# Patient Record
Sex: Female | Born: 1995 | Race: Black or African American | Hispanic: No | Marital: Single | Smoking: Current every day smoker
Health system: Southern US, Community
[De-identification: ages and names within clinical notes are randomized; demographics above are authoritative.]

## PROBLEM LIST (undated history)

## (undated) ENCOUNTER — Inpatient Hospital Stay (HOSPITAL_COMMUNITY): Payer: Self-pay

## (undated) ENCOUNTER — Emergency Department (HOSPITAL_BASED_OUTPATIENT_CLINIC_OR_DEPARTMENT_OTHER)

## (undated) DIAGNOSIS — F329 Major depressive disorder, single episode, unspecified: Secondary | ICD-10-CM

## (undated) DIAGNOSIS — F419 Anxiety disorder, unspecified: Secondary | ICD-10-CM

## (undated) DIAGNOSIS — R51 Headache: Secondary | ICD-10-CM

## (undated) DIAGNOSIS — F32A Depression, unspecified: Secondary | ICD-10-CM

## (undated) DIAGNOSIS — R519 Headache, unspecified: Secondary | ICD-10-CM

## (undated) DIAGNOSIS — IMO0002 Reserved for concepts with insufficient information to code with codable children: Secondary | ICD-10-CM

## (undated) DIAGNOSIS — D649 Anemia, unspecified: Secondary | ICD-10-CM

## (undated) HISTORY — PX: APPENDECTOMY: SHX54

## (undated) HISTORY — PX: HERNIA REPAIR: SHX51

---
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
## 2016-02-11 ENCOUNTER — Inpatient Hospital Stay (HOSPITAL_COMMUNITY)
Admission: AD | Admit: 2016-02-11 | Discharge: 2016-02-11 | Disposition: A | Discharge status: Home / Self Care | Payer: Commercial Managed Care - HMO | Source: Ambulatory Visit | Attending: Obstetrics and Gynecology | Admitting: Obstetrics and Gynecology

## 2016-02-11 ENCOUNTER — Inpatient Hospital Stay (HOSPITAL_COMMUNITY): Payer: Commercial Managed Care - HMO

## 2016-02-11 ENCOUNTER — Encounter (HOSPITAL_COMMUNITY): Payer: Self-pay | Admitting: *Deleted

## 2016-02-11 DIAGNOSIS — O4691 Antepartum hemorrhage, unspecified, first trimester: Secondary | ICD-10-CM

## 2016-02-11 DIAGNOSIS — O468X1 Other antepartum hemorrhage, first trimester: Secondary | ICD-10-CM

## 2016-02-11 DIAGNOSIS — O418X10 Other specified disorders of amniotic fluid and membranes, first trimester, not applicable or unspecified: Secondary | ICD-10-CM

## 2016-02-11 DIAGNOSIS — O209 Hemorrhage in early pregnancy, unspecified: Secondary | ICD-10-CM | POA: Insufficient documentation

## 2016-02-11 DIAGNOSIS — Z3A01 Less than 8 weeks gestation of pregnancy: Secondary | ICD-10-CM | POA: Insufficient documentation

## 2016-02-11 DIAGNOSIS — N898 Other specified noninflammatory disorders of vagina: Secondary | ICD-10-CM | POA: Diagnosis present

## 2016-02-11 LAB — URINALYSIS, ROUTINE W REFLEX MICROSCOPIC
BILIRUBIN URINE: NEGATIVE
GLUCOSE, UA: NEGATIVE mg/dL
KETONES UR: 15 mg/dL — AB
LEUKOCYTES UA: NEGATIVE
Nitrite: NEGATIVE
PH: 5.5 (ref 5.0–8.0)
PROTEIN: NEGATIVE mg/dL
Specific Gravity, Urine: 1.025 (ref 1.005–1.030)

## 2016-02-11 LAB — POCT PREGNANCY, URINE: Preg Test, Ur: POSITIVE — AB

## 2016-02-11 LAB — URINE MICROSCOPIC-ADD ON

## 2016-02-11 LAB — WET PREP, GENITAL
CLUE CELLS WET PREP: NONE SEEN
SPERM: NONE SEEN
TRICH WET PREP: NONE SEEN
YEAST WET PREP: NONE SEEN

## 2016-02-11 MED ORDER — ACETAMINOPHEN 325 MG PO TABS
650.0000 mg | ORAL_TABLET | Freq: Once | ORAL | Status: AC
  Administered 2016-02-11: 650 mg via ORAL
  Filled 2016-02-11: qty 2

## 2016-02-11 NOTE — MAU Provider Note (Signed)
History     CSN: [id number]  Arrival date and time: 02/11/16 1518   First Provider Initiated Contact with Patient 02/11/16 1619      Chief Complaint  Patient presents with  . Vaginal Discharge   HPI Kari [name] 20 y.o. [redacted]w[redacted]d  Has been seen in the office with this pregnancy and has had an ultrasound documenting the baby's heart beat.  Today she has had a headache and has noticed red blood in her underwear.  Was anxious about what is causing the bleeding so she called the office and came in to MAU.  She is a G2P0 and had a 30 week fetal demise in October 2016.    OB History    Gravida Para Term Preterm AB TAB SAB Ectopic Multiple Living   0 0      Past Medical History  Diagnosis Date  . Medical history non-contributory     Past Surgical History  Procedure Laterality Date  . Appendectomy    . Hernia repair      No family history on file.  Social History  Substance Use Topics  . Smoking status: Never Smoker   . Smokeless tobacco: Never Used  . Alcohol Use: No    Allergies: No Known Allergies  Prescriptions prior to admission  Medication Sig Dispense Refill Last Dose  . Prenatal Vit-Fe Fumarate-FA (PRENATAL MULTIVITAMIN) TABS tablet Take 1 tablet by mouth daily at 12 noon.   02/10/2016 at Unknown time    Review of Systems  Constitutional: Negative for fever.  Gastrointestinal: Negative for nausea, vomiting and abdominal pain.  Genitourinary:       No vaginal discharge. Vaginal bleeding. No dysuria.   Physical Exam   Blood pressure 103/58, pulse 82, temperature 99.2 F (37.3 C), temperature source Oral, resp. rate 16, height  (1.575 m), weight 106 lb 9.6 oz (48.353 kg), last menstrual period 11/20/2015.  Physical Exam  Nursing note and vitals reviewed. Constitutional: She is oriented to person, place, and time. She appears well-developed and well-nourished.  HENT:  Head: Normocephalic.  Eyes: EOM are normal.  Neck: Neck supple.   GI: Soft. There is no tenderness.  Genitourinary:  Speculum exam: Vagina - Small amount of bright red streaks in clear discharge, no odor Cervix - No active bleeding, no contact bleeding Bimanual exam: Cervix closed, long and thick Uterus non tender, 6 week size Adnexa non tender, no masses bilaterally GC/Chlam, wet prep done Chaperone present for exam.  Musculoskeletal: Normal range of motion.  Neurological: She is alert and oriented to person, place, and time.  Skin: Skin is warm and dry.  Psychiatric: She has a normal mood and affect.    MAU Course  Procedures Results for orders placed or performed during the hospital encounter of 02/11/16 (from the past 24 hour(s))  Urinalysis, Routine w reflex microscopic (not at [facility])     Status: Abnormal   Collection Time: 02/11/16  3:30 PM  Result Value Ref Range   Color, Urine YELLOW YELLOW   APPearance CLEAR CLEAR   Specific Gravity, Urine 1.025 1.005 - 1.030   pH 5.5 5.0 - 8.0   Glucose, UA NEGATIVE NEGATIVE mg/dL   Hgb urine dipstick SMALL (A) NEGATIVE   Bilirubin Urine NEGATIVE NEGATIVE   Ketones, ur 15 (A) NEGATIVE mg/dL   Protein, ur NEGATIVE NEGATIVE mg/dL   Nitrite NEGATIVE NEGATIVE   Leukocytes, UA NEGATIVE NEGATIVE  Urine microscopic-add on  Status: Abnormal   Collection Time: 02/11/16  3:30 PM  Result Value Ref Range   Squamous Epithelial / LPF 0-5 (A) NONE SEEN   WBC, UA 0-5 0 - 5 WBC/hpf   RBC / HPF 0-5 0 - 5 RBC/hpf   Bacteria, UA FEW (A) NONE SEEN   Urine-Other MUCOUS PRESENT   Pregnancy, urine POC     Status: Abnormal   Collection Time: 02/11/16  3:40 PM  Result Value Ref Range   Preg Test, Ur POSITIVE (A) NEGATIVE  Wet prep, genital     Status: Abnormal   Collection Time: 02/11/16  4:30 PM  Result Value Ref Range   Yeast Wet Prep HPF POC NONE SEEN NONE SEEN   Trich, Wet Prep NONE SEEN NONE SEEN   Clue Cells Wet Prep HPF POC NONE SEEN NONE SEEN   WBC, Wet Prep HPF POC FEW (A) NONE SEEN   Sperm NONE  SEEN    CLINICAL DATA: Vaginal bleeding. Six weeks and 6 days pregnant by previous in office ultrasound.  EXAM: OBSTETRIC <14 WK [place] AND TRANSVAGINAL OB US  TECHNIQUE: Both transabdominal and transvaginal ultrasound examinations were performed for complete evaluation of the gestation as well as the maternal uterus, adnexal regions, and pelvic cul-de-sac. Transvaginal technique was performed to assess early pregnancy.  COMPARISON: 10/07/2015.  FINDINGS: Intrauterine gestational sac: Visualized/normal in shape.  Yolk sac: Visualized/normal in shape  Embryo: Visualized  Cardiac Activity: Visualized  Heart Rate: 103 bpm  CRL: 3.1 mm 5 w 6 d [place] EDC: 10/07/2016  Subchorionic hemorrhage: Small  Maternal uterus/adnexae: Right ovarian corpus luteum. Normal appearing left ovary. No free peritoneal fluid.  IMPRESSION: 1. Single live intrauterine gestation with an estimated gestational age of [redacted] weeks and 6 days. 2. Small subchorionic hemorrhage.   MDM Tylenol ordered for her headache.  Dr. Rana Snare notified at 1640 and discussed the plan of care. Discussed subchorionic hemorrhage with patient and her partner.  No questions voiced.  Assessment and Plan  Bleeding in pregnancy due to subchorionic hemorrhage  Plan Keep your appointments in the office. Pelvic rest until you see the doctor.   Kari [name] 02/11/2016, 4:39 PM

## 2016-02-11 NOTE — MAU Note (Signed)
Dark brown d/c, intercourse on wed.  Only noticed in underwear.  Denies itching, foul vaginal odor

## 2016-02-11 NOTE — MAU Note (Signed)
Patient presents with abnormal vaginal discharge reddish brown color started today, positive UPT at [facility], last intercourse on Thursday.

## 2016-02-11 NOTE — Discharge Instructions (Signed)
Keep your appointments in the office.   Return if you develop worsening abdominal pain or heavy vaginal bleeding.

## 2016-02-12 LAB — HIV ANTIBODY (ROUTINE TESTING W REFLEX): HIV SCREEN 4TH GENERATION: NONREACTIVE

## 2016-02-12 LAB — RPR: RPR Ser Ql: NONREACTIVE

## 2016-02-13 ENCOUNTER — Encounter (HOSPITAL_COMMUNITY): Payer: Self-pay | Admitting: *Deleted

## 2016-02-13 ENCOUNTER — Inpatient Hospital Stay (HOSPITAL_COMMUNITY): Payer: Commercial Managed Care - HMO

## 2016-02-13 ENCOUNTER — Inpatient Hospital Stay (HOSPITAL_COMMUNITY)
Admission: AD | Admit: 2016-02-13 | Discharge: 2016-02-13 | Disposition: A | Discharge status: Home / Self Care | Payer: Commercial Managed Care - HMO | Source: Ambulatory Visit | Attending: Obstetrics and Gynecology | Admitting: Obstetrics and Gynecology

## 2016-02-13 DIAGNOSIS — Z3A01 Less than 8 weeks gestation of pregnancy: Secondary | ICD-10-CM | POA: Diagnosis not present

## 2016-02-13 DIAGNOSIS — O039 Complete or unspecified spontaneous abortion without complication: Secondary | ICD-10-CM | POA: Insufficient documentation

## 2016-02-13 DIAGNOSIS — O208 Other hemorrhage in early pregnancy: Secondary | ICD-10-CM | POA: Diagnosis present

## 2016-02-13 LAB — GC/CHLAMYDIA PROBE AMP (~~LOC~~) NOT AT ARMC
Chlamydia: NEGATIVE
Neisseria Gonorrhea: NEGATIVE

## 2016-02-13 NOTE — MAU Note (Signed)
Pt seen in MAU 2 days ago, says was dx w/ subchorionic hemorrhage.  Began heavy bleeding mid-afternoon today with a few small clots.  One panty liner saturated, has small amount bright red blood on pad now.

## 2016-02-13 NOTE — MAU Note (Signed)
Discharge instructions reviewed with patient and significant other.  Both tearful and visibly upset by loss.  Both decline visit from spiritual care team member.  Envelope with Heartstring and Grief handouts given.  Couple selected a crocheted heart as a memento of this baby.

## 2016-02-13 NOTE — MAU Provider Note (Signed)
Chief Complaint: Vaginal Bleeding   First Provider Initiated Contact with Patient 02/13/16 1630        SUBJECTIVE HPI Kari [name] is a 20 y.o. G2P0100 at [redacted]w[redacted]d by LMP who presents to maternity admissions reporting heavy bleeding which started about 3:30 today.  No cramping.  She denies vaginal itching/burning, urinary symptoms, h/a, dizziness, n/v, or fever/chills.    She was seen two days ago for spotting and small [facility] was seen with a live fetus  RN Note: Pt seen in MAU 2 days ago, says was dx w/ subchorionic hemorrhage. Began heavy bleeding mid-afternoon today with a few small clots. One panty liner saturated, has small amount bright red blood on pad now.         Past Medical History  Diagnosis Date  . Medical history non-contributory    Past Surgical History  Procedure Laterality Date  . Appendectomy    . Hernia repair     Social History   Social History  . Marital Status: Single    Spouse Name: N/A  . Number of Children: N/A  . Years of Education: N/A   Occupational History  . Not on file.   Social History Main Topics  . Smoking status: Never Smoker   . Smokeless tobacco: Never Used  . Alcohol Use: No  . Drug Use: No  . Sexual Activity: Yes   Other Topics Concern  . Not on file   Social History Narrative   No current facility-administered medications on file prior to encounter.   Current Outpatient Prescriptions on File Prior to Encounter  Medication Sig Dispense Refill  . Prenatal Vit-Fe Fumarate-FA (PRENATAL MULTIVITAMIN) TABS tablet Take 1 tablet by mouth daily at 12 noon.     No Known Allergies  I have reviewed patient's Past Medical Hx, Surgical Hx, Family Hx, Social Hx, medications and allergies.   ROS:  Review of Systems   Constitutional: Negative for fever and chills.  Gastrointestinal: Negative for nausea, vomiting, abdominal pain, diarrhea and constipation.  Genitourinary: Negative for dysuria. Positive for  bleeding Musculoskeletal: Negative for back pain.  Neurological: Negative for dizziness and weakness.    Physical Exam  Patient Vitals for the past 24 hrs:  BP Temp Temp src Pulse Resp  02/13/16 1618 122/73 mmHg 98.7 F (37.1 C) Oral 99 18   Physical Exam  Constitutional: Well-developed, well-nourished female in no acute distress.  Cardiovascular: normal rate Respiratory: normal effort GI: Abd soft, non-tender. Pos BS x 4 MS: Extremities nontender, no edema, normal ROM Neurologic: Alert and oriented x 4.  GU: Neg CVAT.  PELVIC EXAM: Cervix pink, visually closed, without lesion, moderate red blood with grape sized clot in vault, vaginal walls and external genitalia normal Bimanual exam: Cervix 0/long/high, firm, anterior, neg CMT, uterus nontender, nonenlarged, adnexa without tenderness, enlargement, or mass   LAB RESULTS No results found for this or any previous visit (from the past 24 hour(s)).  --/--/B POS, B POS (10/14 1852)  IMAGING US Ob Comp Less 14 Wks  02/11/2016  CLINICAL DATA:  Vaginal bleeding. Six weeks and 6 days pregnant by previous in office ultrasound. EXAM: OBSTETRIC <14 WK [place] AND TRANSVAGINAL OB US TECHNIQUE: Both transabdominal and transvaginal ultrasound examinations were performed for complete evaluation of the gestation as well as the maternal uterus, adnexal regions, and pelvic cul-de-sac. Transvaginal technique was performed to assess early pregnancy. COMPARISON:  10/07/2015. FINDINGS: Intrauterine gestational sac: Visualized/normal in shape. Yolk sac:  Visualized/normal in shape Embryo:  Visualized Cardiac Activity:  Visualized Heart Rate: 103  bpm CRL:  3.1  mm   5 w   6 d                  [place] EDC: 10/07/2016 Subchorionic hemorrhage:  Small Maternal uterus/adnexae: Right ovarian corpus luteum. Normal appearing left ovary. No free peritoneal fluid. IMPRESSION: 1. Single live intrauterine gestation with an estimated gestational age of [redacted] weeks and 6 days. 2. Small  subchorionic hemorrhage. Electronically Signed   By: Beckie Salts M.D.   On: 02/11/2016 17:37   US Ob Transvaginal  02/13/2016  CLINICAL DATA:  Heavy vaginal bleeding and passing clots. EXAM: TRANSVAGINAL OB ULTRASOUND TECHNIQUE: Transvaginal ultrasound was performed for complete evaluation of the gestation as well as the maternal uterus, adnexal regions, and pelvic cul-de-sac. COMPARISON:  02/11/2016 FINDINGS: Intrauterine gestational sac: None. Yolk sac:  None. Embryo:  None. Cardiac Activity: N/A Heart Rate: N/A bpm Subchorionic hemorrhage:  None visualized. Maternal uterus/adnexae: Thickened and heterogeneous endometrial contents bulging into the upper cervix with probable areas hemorrhage and products of conception. The IUP identified on the prior study is no longer identified. Normal right ovary. Normal left ovary. Small amount of free pelvic fluid. IMPRESSION: Ultrasound findings consistent with an ongoing spontaneous abortion. The IUP identified on the prior study is no longer identified. Electronically Signed   By: Rudie Meyer M.D.   On: 02/13/2016 17:36   US Ob Transvaginal  02/11/2016  CLINICAL DATA:  Vaginal bleeding. Six weeks and 6 days pregnant by previous in office ultrasound. EXAM: OBSTETRIC <14 WK [place] AND TRANSVAGINAL OB US TECHNIQUE: Both transabdominal and transvaginal ultrasound examinations were performed for complete evaluation of the gestation as well as the maternal uterus, adnexal regions, and pelvic cul-de-sac. Transvaginal technique was performed to assess early pregnancy. COMPARISON:  10/07/2015. FINDINGS: Intrauterine gestational sac: Visualized/normal in shape. Yolk sac:  Visualized/normal in shape Embryo:  Visualized Cardiac Activity: Visualized Heart Rate: 103  bpm CRL:  3.1  mm   5 w   6 d                  [place] EDC: 10/07/2016 Subchorionic hemorrhage:  Small Maternal uterus/adnexae: Right ovarian corpus luteum. Normal appearing left ovary. No free peritoneal fluid. IMPRESSION:  1. Single live intrauterine gestation with an estimated gestational age of [redacted] weeks and 6 days. 2. Small subchorionic hemorrhage. Electronically Signed   By: Beckie Salts M.D.   On: 02/11/2016 17:37    MAU Management/MDM: Ordered ultrasound and reviewed results.   Consult Dr Renaldo Fiddler with presentation, exam findings, and results.   Discussed findings with patient and her partner.  They were appropriately grieving.  Pt stable at time of discharge.  ASSESSMENT Pregnancy at [redacted]w[redacted]d  Spontaneous abortion in progress  PLAN Discharge home Support offered Bleeding precautions Grief box given by RN to patient Follow up in office in 1-2 weeks    Medication List    ASK your doctor about these medications        prenatal multivitamin Tabs tablet  Take 1 tablet by mouth daily at 12 noon.         Encouraged to return here or to other Urgent Care/ED if she develops worsening of symptoms, increase in pain, fever, or other concerning symptoms.    Wynelle Bourgeois CNM, MSN Certified Nurse-Midwife 02/13/2016  4:54 PM

## 2016-02-13 NOTE — Discharge Instructions (Signed)
Miscarriage  A miscarriage is the sudden loss of an unborn baby (fetus) before the 20th week of pregnancy. Most miscarriages happen in the first 3 months of pregnancy. Sometimes, it happens before a woman even knows she is pregnant. A miscarriage is also called a "spontaneous miscarriage" or "early pregnancy loss." Having a miscarriage can be an emotional experience. Talk with your caregiver about any questions you may have about miscarrying, the grieving process, and your future pregnancy plans.  CAUSES    Problems with the fetal chromosomes that make it impossible for the baby to develop normally. Problems with the baby's genes or chromosomes are most often the result of errors that occur, by chance, as the embryo divides and grows. The problems are not inherited from the parents.   Infection of the cervix or uterus.    Hormone problems.    Problems with the cervix, such as having an incompetent cervix. This is when the tissue in the cervix is not strong enough to hold the pregnancy.    Problems with the uterus, such as an abnormally shaped uterus, uterine fibroids, or congenital abnormalities.    Certain medical conditions.    Smoking, drinking alcohol, or taking illegal drugs.    Trauma.   Often, the cause of a miscarriage is unknown.   SYMPTOMS    Vaginal bleeding or spotting, with or without cramps or pain.   Pain or cramping in the abdomen or lower back.   Passing fluid, tissue, or blood clots from the vagina.  DIAGNOSIS   Your caregiver will perform a physical exam. You may also have an ultrasound to confirm the miscarriage. Blood or urine tests may also be ordered.  TREATMENT    Sometimes, treatment is not necessary if you naturally pass all the fetal tissue that was in the uterus. If some of the fetus or placenta remains in the body (incomplete miscarriage), tissue left behind may become infected and must be removed. Usually, a dilation and curettage (D and C) procedure is performed.  During a D and C procedure, the cervix is widened (dilated) and any remaining fetal or placental tissue is gently removed from the uterus.   Antibiotic medicines are prescribed if there is an infection. Other medicines may be given to reduce the size of the uterus (contract) if there is a lot of bleeding.   If you have Rh negative blood and your baby was Rh positive, you will need a Rh immunoglobulin shot. This shot will protect any future baby from having Rh blood problems in future pregnancies.  HOME CARE INSTRUCTIONS    Your caregiver may order bed rest or may allow you to continue light activity. Resume activity as directed by your caregiver.   Have someone help with home and family responsibilities during this time.    Keep track of the number of sanitary pads you use each day and how soaked (saturated) they are. Write down this information.    Do not use tampons. Do not douche or have sexual intercourse until approved by your caregiver.    Only take over-the-counter or prescription medicines for pain or discomfort as directed by your caregiver.    Do not take aspirin. Aspirin can cause bleeding.    Keep all follow-up appointments with your caregiver.    If you or your partner have problems with grieving, talk to your caregiver or seek counseling to help cope with the pregnancy loss. Allow enough time to grieve before trying to get pregnant again.     SEEK IMMEDIATE MEDICAL CARE IF:    You have severe cramps or pain in your back or abdomen.   You have a fever.   You pass large blood clots (walnut-sized or larger) ortissue from your vagina. Save any tissue for your caregiver to inspect.    Your bleeding increases.    You have a thick, bad-smelling vaginal discharge.   You become lightheaded, weak, or you faint.    You have chills.   MAKE SURE YOU:   Understand these instructions.   Will watch your condition.   Will get help right away if you are not doing well or get worse.     This  information is not intended to replace advice given to you by your health care provider. Make sure you discuss any questions you have with your health care provider.     Document Released: 06/05/2001 Document Revised: 04/06/2013 Document Reviewed: 01/29/2012  [employer] Interactive Patient Education 2016 [employer]

## 2016-07-18 ENCOUNTER — Inpatient Hospital Stay (HOSPITAL_COMMUNITY)
Admission: AD | Admit: 2016-07-18 | Discharge: 2016-07-18 | Disposition: A | Discharge status: Home / Self Care | Payer: Commercial Managed Care - HMO | Source: Ambulatory Visit | Attending: Obstetrics and Gynecology | Admitting: Obstetrics and Gynecology

## 2016-07-18 ENCOUNTER — Inpatient Hospital Stay (HOSPITAL_COMMUNITY): Payer: Commercial Managed Care - HMO

## 2016-07-18 ENCOUNTER — Encounter: Payer: Self-pay | Admitting: Advanced Practice Midwife

## 2016-07-18 DIAGNOSIS — N83209 Unspecified ovarian cyst, unspecified side: Secondary | ICD-10-CM | POA: Diagnosis not present

## 2016-07-18 DIAGNOSIS — R1030 Lower abdominal pain, unspecified: Secondary | ICD-10-CM | POA: Diagnosis present

## 2016-07-18 DIAGNOSIS — N83201 Unspecified ovarian cyst, right side: Secondary | ICD-10-CM | POA: Diagnosis not present

## 2016-07-18 DIAGNOSIS — O26899 Other specified pregnancy related conditions, unspecified trimester: Secondary | ICD-10-CM

## 2016-07-18 DIAGNOSIS — G8929 Other chronic pain: Secondary | ICD-10-CM

## 2016-07-18 DIAGNOSIS — R51 Headache: Secondary | ICD-10-CM | POA: Insufficient documentation

## 2016-07-18 DIAGNOSIS — O26891 Other specified pregnancy related conditions, first trimester: Secondary | ICD-10-CM | POA: Insufficient documentation

## 2016-07-18 DIAGNOSIS — Z3A01 Less than 8 weeks gestation of pregnancy: Secondary | ICD-10-CM | POA: Diagnosis not present

## 2016-07-18 DIAGNOSIS — O3481 Maternal care for other abnormalities of pelvic organs, first trimester: Secondary | ICD-10-CM | POA: Diagnosis not present

## 2016-07-18 DIAGNOSIS — R519 Headache, unspecified: Secondary | ICD-10-CM

## 2016-07-18 DIAGNOSIS — R109 Unspecified abdominal pain: Secondary | ICD-10-CM

## 2016-07-18 DIAGNOSIS — O3480 Maternal care for other abnormalities of pelvic organs, unspecified trimester: Secondary | ICD-10-CM

## 2016-07-18 LAB — URINE MICROSCOPIC-ADD ON

## 2016-07-18 LAB — CBC
HEMATOCRIT: 33.9 % — AB (ref 36.0–46.0)
Hemoglobin: 11.8 g/dL — ABNORMAL LOW (ref 12.0–15.0)
MCH: 25.4 pg — ABNORMAL LOW (ref 26.0–34.0)
MCHC: 34.8 g/dL (ref 30.0–36.0)
MCV: 73.1 fL — ABNORMAL LOW (ref 78.0–100.0)
PLATELETS: 201 10*3/uL (ref 150–400)
RBC: 4.64 MIL/uL (ref 3.87–5.11)
RDW: 13.5 % (ref 11.5–15.5)
WBC: 5.3 10*3/uL (ref 4.0–10.5)

## 2016-07-18 LAB — URINALYSIS, ROUTINE W REFLEX MICROSCOPIC
Bilirubin Urine: NEGATIVE
GLUCOSE, UA: NEGATIVE mg/dL
KETONES UR: 40 mg/dL — AB
Leukocytes, UA: NEGATIVE
Nitrite: NEGATIVE
PROTEIN: NEGATIVE mg/dL
Specific Gravity, Urine: 1.02 (ref 1.005–1.030)
pH: 5 (ref 5.0–8.0)

## 2016-07-18 LAB — WET PREP, GENITAL
CLUE CELLS WET PREP: NONE SEEN
Sperm: NONE SEEN
TRICH WET PREP: NONE SEEN
Yeast Wet Prep HPF POC: NONE SEEN

## 2016-07-18 LAB — HCG, QUANTITATIVE, PREGNANCY: HCG, BETA CHAIN, QUANT, S: 57599 m[IU]/mL — AB (ref <5)

## 2016-07-18 LAB — POCT PREGNANCY, URINE: Preg Test, Ur: POSITIVE — AB

## 2016-07-18 MED ORDER — CONCEPT OB 130-92.4-1 MG PO CAPS: 1.0000 | ORAL_CAPSULE | Freq: Every day | ORAL | 12 refills | Status: DC

## 2016-07-18 NOTE — Discharge Instructions (Signed)
Ovarian Cyst An ovarian cyst is a fluid-filled sac that forms on an ovary. The ovaries are small organs that produce eggs in women. Various types of cysts can form on the ovaries. Most are not cancerous. Many do not cause problems, and they often go away on their own. Some may cause symptoms and require treatment. Common types of ovarian cysts include:  Functional cysts--These cysts may occur every month during the menstrual cycle. This is normal. The cysts usually go away with the next menstrual cycle if the woman does not get pregnant. Usually, there are no symptoms with a functional cyst.  Endometrioma cysts--These cysts form from the tissue that lines the uterus. They are also called "chocolate cysts" because they become filled with blood that turns brown. This type of cyst can cause pain in the lower abdomen during intercourse and with your menstrual period.  Cystadenoma cysts--This type develops from the cells on the outside of the ovary. These cysts can get very big and cause lower abdomen pain and pain with intercourse. This type of cyst can twist on itself, cut off its blood supply, and cause severe pain. It can also easily rupture and cause a lot of pain.  Dermoid cysts--This type of cyst is sometimes found in both ovaries. These cysts may contain different kinds of body tissue, such as skin, teeth, hair, or cartilage. They usually do not cause symptoms unless they get very big.  Theca lutein cysts--These cysts occur when too much of a certain hormone (human chorionic gonadotropin) is produced and overstimulates the ovaries to produce an egg. This is most common after procedures used to assist with the conception of a baby (in vitro fertilization). CAUSES   Fertility drugs can cause a condition in which multiple large cysts are formed on the ovaries. This is called ovarian hyperstimulation syndrome.  A condition called polycystic ovary syndrome can cause hormonal imbalances that can lead to  nonfunctional ovarian cysts. SIGNS AND SYMPTOMS  Many ovarian cysts do not cause symptoms. If symptoms are present, they may include:  Pelvic pain or pressure.  Pain in the lower abdomen.  Pain during sexual intercourse.  Increasing girth (swelling) of the abdomen.  Abnormal menstrual periods.  Increasing pain with menstrual periods.  Stopping having menstrual periods without being pregnant. DIAGNOSIS  These cysts are commonly found during a routine or annual pelvic exam. Tests may be ordered to find out more about the cyst. These tests may include:  Ultrasound.  X-ray of the pelvis.  CT scan.  MRI.  Blood tests. TREATMENT  Many ovarian cysts go away on their own without treatment. Your health care provider may want to check your cyst regularly for 2-3 months to see if it changes. For women in menopause, it is particularly important to monitor a cyst closely because of the higher rate of ovarian cancer in menopausal women. When treatment is needed, it may include any of the following:  A procedure to drain the cyst (aspiration). This may be done using a long needle and ultrasound. It can also be done through a laparoscopic procedure. This involves using a thin, lighted tube with a tiny camera on the end (laparoscope) inserted through a small incision.  Surgery to remove the whole cyst. This may be done using laparoscopic surgery or an open surgery involving a larger incision in the lower abdomen.  Hormone treatment or birth control pills. These methods are sometimes used to help dissolve a cyst. HOME CARE INSTRUCTIONS   Only take over-the-counter  or prescription medicines as directed by your health care provider.  Follow up with your health care provider as directed.  Get regular pelvic exams and Pap tests. SEEK MEDICAL CARE IF:   Your periods are late, irregular, or painful, or they stop.  Your pelvic pain or abdominal pain does not go away.  Your abdomen becomes  larger or swollen.  You have pressure on your bladder or trouble emptying your bladder completely.  You have pain during sexual intercourse.  You have feelings of fullness, pressure, or discomfort in your stomach.  You lose weight for no apparent reason.  You feel generally ill.  You become constipated.  You lose your appetite.  You develop acne.  You have an increase in body and facial hair.  You are gaining weight, without changing your exercise and eating habits.  You think you are pregnant. SEEK IMMEDIATE MEDICAL CARE IF:   You have increasing abdominal pain.  You feel sick to your stomach (nauseous), and you throw up (vomit).  You develop a fever that comes on suddenly.  You have abdominal pain during a bowel movement.  Your menstrual periods become heavier than usual. MAKE SURE YOU:  Understand these instructions.  Will watch your condition.  Will get help right away if you are not doing well or get worse.   This information is not intended to replace advice given to you by your health care provider. Make sure you discuss any questions you have with your health care provider.   Document Released: 12/10/2005 Document Revised: 12/15/2013 Document Reviewed: 08/17/2013 Elsevier Interactive Patient Education 2016 [employer] Inc[employer]  Abdominal Pain During Pregnancy Abdominal pain is common in pregnancy. Most of the time, it does not cause harm. There are many causes of abdominal pain. Some causes are more serious than others. Some of the causes of abdominal pain in pregnancy are easily diagnosed. Occasionally, the diagnosis takes time to understand. Other times, the cause is not determined. Abdominal pain can be a sign that something is very wrong with the pregnancy, or the pain may have nothing to do with the pregnancy at all. For this reason, always tell your health care provider if you have any abdominal discomfort. HOME CARE INSTRUCTIONS  Monitor your abdominal pain  for any changes. The following actions may help to alleviate any discomfort you are experiencing:  Do not have sexual intercourse or put anything in your vagina until your symptoms go away completely.  Get plenty of rest until your pain improves.  Drink clear fluids if you feel nauseous. Avoid solid food as long as you are uncomfortable or nauseous.  Only take over-the-counter or prescription medicine as directed by your health care provider.  Keep all follow-up appointments with your health care provider. SEEK IMMEDIATE MEDICAL CARE IF:  You are bleeding, leaking fluid, or passing tissue from the vagina.  You have increasing pain or cramping.  You have persistent vomiting.  You have painful or bloody urination.  You have a fever.  You notice a decrease in your baby's movements.  You have extreme weakness or feel faint.  You have shortness of breath, with or without abdominal pain.  You develop a severe headache with abdominal pain.  You have abnormal vaginal discharge with abdominal pain.  You have persistent diarrhea.  You have abdominal pain that continues even after rest, or gets worse. MAKE SURE YOU:   Understand these instructions.  Will watch your condition.  Will get help right away if you are not  doing well or get worse.   This information is not intended to replace advice given to you by your health care provider. Make sure you discuss any questions you have with your health care provider.   Document Released: 12/10/2005 Document Revised: 09/30/2013 Document Reviewed: 07/09/2013 Elsevier Interactive Patient Education [employer]

## 2016-07-18 NOTE — MAU Provider Note (Signed)
Chief Complaint: Headache; Abdominal Pain; and Possible Pregnancy   First Provider Initiated Contact with Patient 07/18/16 1309     SUBJECTIVE HPI: Kari [name] is a 20 y.o. G3P0110 at [redacted]w[redacted]d by irreg LMP who presents to [facility] reporting low abd cramping x 2 weeks and generalized HA. HA is typical of what she has gotten w/ previous pregnancies and she also has Hx migraine.   Location: SP Quality: cramping Severity: 4/10 on pain scale Duration: 2 weeks Course: unchanged Context: possible pregnancy Timing: intermittent Modifying factors: Hasn't tried anything for pain. There are no aggravating or aleviating factors. Associated signs and symptoms: Pos for nausea. Neg for fever, chills, VB, vaginal discharge, vomiting, diarrhea, constipation or urinary complaints.   Location: HA Quality: Throbbing Severity: 8/10 on pain scale Duration: few weeks Course: unchanged Context: Possible pregnancy Timing: intermittent Modifying factors: Improves w/ rest. Hasn't taken anything for the pain because she doesn't like to take medicine. Associated signs and symptoms: Neg for fever, chills, difficulties w/ speech or gait, weakness.    Past Medical History:  Diagnosis Date  . Medical history non-contributory    OB History  Gravida Para Term Preterm AB Living  0  SAB TAB Ectopic Multiple Live Births  1     0      # Outcome Date GA Lbr Len/2nd Weight Sex Delivery Anes PTL Lv  3 Current           2 SAB 01/2016          1 Preterm 10/08/15 [redacted]w[redacted]d 03:20 / 00:02 3 lb 0.9 oz (1.385 kg) M Vag-Spont EPI  FD     Birth Comments: none     Past Surgical History:  Procedure Laterality Date  . APPENDECTOMY    . HERNIA REPAIR     Social History   Social History  . Marital status: Single    Spouse name: N/A  . Number of children: N/A  . Years of education: N/A   Occupational History  . Not on file.   Social History Main Topics  . Smoking status: Never Smoker  .  Smokeless tobacco: Never Used  . Alcohol use No  . Drug use: No  . Sexual activity: Yes   Other Topics Concern  . Not on file   Social History Narrative  . No narrative on file   No current facility-administered medications on file prior to encounter.    No current outpatient prescriptions on file prior to encounter.   No Known Allergies  I have reviewed the past Medical Hx, Surgical Hx, Social Hx, Allergies and Medications.   Review of Systems  Constitutional: Negative for appetite change, chills and fever.  Eyes: Negative for visual disturbance.  Gastrointestinal: Positive for abdominal pain and nausea. Negative for abdominal distention, constipation, diarrhea and vomiting.  Genitourinary: Negative for dysuria, hematuria, vaginal bleeding and vaginal discharge.  Musculoskeletal: Negative for neck pain and neck stiffness.  Neurological: Positive for headaches. Negative for speech difficulty and weakness.    OBJECTIVE Patient Vitals for the past 24 hrs:  BP Temp Temp src Pulse Resp  07/18/16 1248 105/61 98.4 F (36.9 C) Oral 85 16   Constitutional: Well-developed, well-nourished female in no acute distress.  Cardiovascular: normal rate Respiratory: normal rate and effort.  GI: Abd soft, non-tender. Pos BS x 4 MS: Extremities nontender, no edema, normal ROM Neurologic: Alert and oriented x 4.  GU: Neg CVAT.  SPECULUM EXAM: NEFG, physiologic discharge, no  blood noted, cervix clean  BIMANUAL: cervix Closed; uterus slightly enlarged, no adnexal tenderness. Palpable right adnexal mass. No left adnexal mass. No CMT.  LAB RESULTS Results for orders placed or performed during the hospital encounter of 07/18/16 (from the past 24 hour(s))  Urinalysis, Routine w reflex microscopic (not at [facility])     Status: Abnormal   Collection Time: 07/18/16 12:35 PM  Result Value Ref Range   Color, Urine YELLOW YELLOW   APPearance CLEAR CLEAR   Specific Gravity, Urine 1.020 1.005 - 1.030    pH 5.0 5.0 - 8.0   Glucose, UA NEGATIVE NEGATIVE mg/dL   Hgb urine dipstick TRACE (A) NEGATIVE   Bilirubin Urine NEGATIVE NEGATIVE   Ketones, ur 40 (A) NEGATIVE mg/dL   Protein, ur NEGATIVE NEGATIVE mg/dL   Nitrite NEGATIVE NEGATIVE   Leukocytes, UA NEGATIVE NEGATIVE  Urine microscopic-add on     Status: Abnormal   Collection Time: 07/18/16 12:35 PM  Result Value Ref Range   Squamous Epithelial / LPF 0-5 (A) NONE SEEN   WBC, UA 0-5 0 - 5 WBC/hpf   RBC / HPF 0-5 0 - 5 RBC/hpf   Bacteria, UA FEW (A) NONE SEEN  Pregnancy, urine POC     Status: Abnormal   Collection Time: 07/18/16 12:42 PM  Result Value Ref Range   Preg Test, Ur POSITIVE (A) NEGATIVE  hCG, quantitative, pregnancy     Status: Abnormal   Collection Time: 07/18/16  1:16 PM  Result Value Ref Range   hCG, Beta Chain, Quant, S 57,599 (H) <5 mIU/mL  CBC     Status: Abnormal   Collection Time: 07/18/16  1:16 PM  Result Value Ref Range   WBC 5.3 4.0 - 10.5 K/uL   RBC 4.64 3.87 - 5.11 MIL/uL   Hemoglobin 11.8 (L) 12.0 - 15.0 g/dL   HCT 96.0 (L) 45.4 - 09.8 %   MCV 73.1 (L) 78.0 - 100.0 fL   MCH 25.4 (L) 26.0 - 34.0 pg   MCHC 34.8 30.0 - 36.0 g/dL   RDW 11.9 14.7 - 82.9 %   Platelets 201 150 - 400 K/uL  Wet prep, genital     Status: Abnormal   Collection Time: 07/18/16  3:30 PM  Result Value Ref Range   Yeast Wet Prep HPF POC NONE SEEN NONE SEEN   Trich, Wet Prep NONE SEEN NONE SEEN   Clue Cells Wet Prep HPF POC NONE SEEN NONE SEEN   WBC, Wet Prep HPF POC MANY (A) NONE SEEN   Sperm NONE SEEN     IMAGING US Ob Comp Less 14 Wks  Result Date: 07/18/2016 CLINICAL DATA:  Abdominal pain and cramping for several weeks. First trimester pregnancy. Gestational age by LMP of 6 weeks 1 day. EXAM: OBSTETRIC <14 WK [place] AND TRANSVAGINAL OB US TECHNIQUE: Both transabdominal and transvaginal ultrasound examinations were performed for complete evaluation of the gestation as well as the maternal uterus, adnexal regions, and pelvic  cul-de-sac. Transvaginal technique was performed to assess early pregnancy. COMPARISON:  None. FINDINGS: Intrauterine gestational sac: Single Yolk sac:  Visualized Embryo:  Visualized Cardiac Activity: Visualized Heart Rate: 124  bpm CRL:  6  mm   6 w   3 d                  [place] EDC: 03/10/2017 Subchorionic hemorrhage:  None visualized. Maternal uterus/adnexae: Normal appearance of left ovary. A right ovarian cyst is seen containing several thin internal septations, but no definite  solid nodules. This measures 5.0 x 4.6 x 4.8 cm. This has indeterminate but probably benign features. While this may represent a large corpus luteum cyst, a cystic ovarian neoplasm cannot be excluded. Blood flow is seen in surrounding right ovarian tissue on color Doppler ultrasound. Small amount of simple free fluid in cul-de-sac. IMPRESSION: Single living IUP measuring 6 weeks 3 days with [place] EDC of 03/10/2017. This is concordant with LMP. 5cm complex right ovarian cyst, with indeterminate but probably benign characteristics. Although this may represent a large corpus luteum cyst, benign or low-grade cystic ovarian neoplasm cannot be excluded. Recommend followup by transvaginal OB ultrasound in 6 weeks. Electronically Signed   By: Myles Rosenthal M.D.   On: 07/18/2016 15:06  US Ob Transvaginal  Result Date: 07/18/2016 CLINICAL DATA:  Abdominal pain and cramping for several weeks. First trimester pregnancy. Gestational age by LMP of 6 weeks 1 day. EXAM: OBSTETRIC <14 WK [place] AND TRANSVAGINAL OB US TECHNIQUE: Both transabdominal and transvaginal ultrasound examinations were performed for complete evaluation of the gestation as well as the maternal uterus, adnexal regions, and pelvic cul-de-sac. Transvaginal technique was performed to assess early pregnancy. COMPARISON:  None. FINDINGS: Intrauterine gestational sac: Single Yolk sac:  Visualized Embryo:  Visualized Cardiac Activity: Visualized Heart Rate: 124  bpm CRL:  6  mm   6 w   3 d                   [place] EDC: 03/10/2017 Subchorionic hemorrhage:  None visualized. Maternal uterus/adnexae: Normal appearance of left ovary. A right ovarian cyst is seen containing several thin internal septations, but no definite solid nodules. This measures 5.0 x 4.6 x 4.8 cm. This has indeterminate but probably benign features. While this may represent a large corpus luteum cyst, a cystic ovarian neoplasm cannot be excluded. Blood flow is seen in surrounding right ovarian tissue on color Doppler ultrasound. Small amount of simple free fluid in cul-de-sac. IMPRESSION: Single living IUP measuring 6 weeks 3 days with [place] EDC of 03/10/2017. This is concordant with LMP. 5cm complex right ovarian cyst, with indeterminate but probably benign characteristics. Although this may represent a large corpus luteum cyst, benign or low-grade cystic ovarian neoplasm cannot be excluded. Recommend followup by transvaginal OB ultrasound in 6 weeks. Electronically Signed   By: Myles Rosenthal M.D.   On: 07/18/2016 15:06   MAU COURSE CBC, Quant, ABO/Rh, ultrasound, wet prep and GC/chlamydia culture, UA. Declines pain meds.  MDM - Abdominal Pain in early pregnancy with normal intrauterine pregnancy and hemodynamically stable. Pain likely caused by ovarian cyst. No evidence of emergent condition.  - Chronic headache. No evidence of emergent intracranial process. Patient states this is the same as other pregnancy-related headaches.  ASSESSMENT 1. Ovarian cyst during pregnancy, first trimester   2. Abdominal pain affecting pregnancy, antepartum   3. Chronic nonintractable headache, unspecified headache type     PLAN Discharge home in stable condition. Torsion and first trimester precautions Comfort measures, Tylenol when necessary Rx prenatal alignment. Pregnancy verification letter given. Repeat ultrasound in 6 weeks to reevaluate cyst. Follow-up Information    [facility] .   Why:  Start prenatal  care Contact information: [address] [place] [address] [phone]        [facility] [place] [facility]ERN[facility] AD[facility] .   Why:  As needed in emergencies Contact information: [address] [id number] mc [place] [address] [phone]  Medication List    TAKE these medications   CONCEPT OB 130-92.4-1 MG Caps Take 1 tablet by mouth daily.     ASK your doctor about these medications   venlafaxine XR 75 MG 24 hr capsule Commonly known as:  EFFEXOR-XR Take 75 mg by mouth daily with breakfast.        Dorathy Kinsman, CNM 07/18/2016  3:56 PM  4

## 2016-07-18 NOTE — MAU Note (Signed)
UPT was positive today;

## 2016-07-18 NOTE — MAU Note (Signed)
C/o intermittent headache for about 2.5 weeks; c/o slight abdominal cramping for past month; possible pregnancy;

## 2016-07-19 LAB — HIV ANTIBODY (ROUTINE TESTING W REFLEX): HIV SCREEN 4TH GENERATION: NONREACTIVE

## 2016-07-19 LAB — GC/CHLAMYDIA PROBE AMP (~~LOC~~) NOT AT ARMC
Chlamydia: NEGATIVE
NEISSERIA GONORRHEA: NEGATIVE

## 2016-07-29 ENCOUNTER — Inpatient Hospital Stay (HOSPITAL_COMMUNITY)
Admission: AD | Admit: 2016-07-29 | Discharge: 2016-07-29 | Disposition: A | Discharge status: Home / Self Care | Payer: Commercial Managed Care - HMO | Source: Ambulatory Visit | Attending: Obstetrics & Gynecology | Admitting: Obstetrics & Gynecology

## 2016-07-29 ENCOUNTER — Encounter (HOSPITAL_COMMUNITY): Payer: Self-pay | Admitting: *Deleted

## 2016-07-29 ENCOUNTER — Inpatient Hospital Stay (HOSPITAL_COMMUNITY): Payer: Commercial Managed Care - HMO

## 2016-07-29 DIAGNOSIS — Z3491 Encounter for supervision of normal pregnancy, unspecified, first trimester: Secondary | ICD-10-CM

## 2016-07-29 DIAGNOSIS — Z3A08 8 weeks gestation of pregnancy: Secondary | ICD-10-CM | POA: Insufficient documentation

## 2016-07-29 DIAGNOSIS — O3481 Maternal care for other abnormalities of pelvic organs, first trimester: Secondary | ICD-10-CM | POA: Diagnosis present

## 2016-07-29 DIAGNOSIS — O26891 Other specified pregnancy related conditions, first trimester: Secondary | ICD-10-CM | POA: Diagnosis not present

## 2016-07-29 DIAGNOSIS — R109 Unspecified abdominal pain: Secondary | ICD-10-CM | POA: Insufficient documentation

## 2016-07-29 DIAGNOSIS — Z79899 Other long term (current) drug therapy: Secondary | ICD-10-CM | POA: Diagnosis not present

## 2016-07-29 DIAGNOSIS — N83201 Unspecified ovarian cyst, right side: Secondary | ICD-10-CM | POA: Diagnosis not present

## 2016-07-29 DIAGNOSIS — O4691 Antepartum hemorrhage, unspecified, first trimester: Secondary | ICD-10-CM | POA: Diagnosis not present

## 2016-07-29 DIAGNOSIS — O209 Hemorrhage in early pregnancy, unspecified: Secondary | ICD-10-CM

## 2016-07-29 HISTORY — DX: Reserved for concepts with insufficient information to code with codable children: IMO0002

## 2016-07-29 LAB — URINALYSIS, ROUTINE W REFLEX MICROSCOPIC
BILIRUBIN URINE: NEGATIVE
GLUCOSE, UA: NEGATIVE mg/dL
Ketones, ur: 15 mg/dL — AB
Leukocytes, UA: NEGATIVE
Nitrite: NEGATIVE
PROTEIN: NEGATIVE mg/dL
Specific Gravity, Urine: 1.02 (ref 1.005–1.030)
pH: 6 (ref 5.0–8.0)

## 2016-07-29 LAB — URINE MICROSCOPIC-ADD ON

## 2016-07-29 NOTE — Discharge Instructions (Signed)
Vaginal Bleeding During Pregnancy, First Trimester A small amount of bleeding (spotting) from the vagina is common in early pregnancy. Sometimes the bleeding is normal and is not a problem, and sometimes it is a sign of something serious. Be sure to tell your doctor about any bleeding from your vagina right away. HOME CARE  Watch your condition for any changes.  Follow your doctor's instructions about how active you can be.  If you are on bed rest:  You may need to stay in bed and only get up to use the bathroom.  You may be allowed to do some activities.  If you need help, make plans for someone to help you.  Write down:  The number of pads you use each day.  How often you change pads.  How soaked (saturated) your pads are.  Do not use tampons.  Do not douche.  Do not have sex or orgasms until your doctor says it is okay.  If you pass any tissue from your vagina, save the tissue so you can show it to your doctor.  Only take medicines as told by your doctor.  Do not take aspirin because it can make you bleed.  Keep all follow-up visits as told by your doctor. GET HELP IF:   You bleed from your vagina.  You have cramps.  You have labor pains.  You have a fever that does not go away after you take medicine. GET HELP RIGHT AWAY IF:   You have very bad cramps in your back or belly (abdomen).  You pass large clots or tissue from your vagina.  You bleed more.  You feel light-headed or weak.  You pass out (faint).  You have chills.  You are leaking fluid or have a gush of fluid from your vagina.  You pass out while pooping (having a bowel movement). MAKE SURE YOU:  Understand these instructions.  Will watch your condition.  Will get help right away if you are not doing well or get worse.   This information is not intended to replace advice given to you by your health care provider. Make sure you discuss any questions you have with your health care  provider.   Document Released: 04/26/2014 Document Reviewed: 04/26/2014 Elsevier Interactive Patient Education 2016 Elsevier Inc.   Pelvic Rest Pelvic rest is sometimes recommended for women when:   The placenta is partially or completely covering the opening of the cervix (placenta previa).  There is bleeding between the uterine wall and the amniotic sac in the first trimester (subchorionic hemorrhage).  The cervix begins to open without labor starting (incompetent cervix, cervical insufficiency).  The labor is too early (preterm labor). HOME CARE INSTRUCTIONS  Do not have sexual intercourse, stimulation, or an orgasm.  Do not use tampons, douche, or put anything in the vagina.  Do not lift anything over 10 pounds (4.5 kg).  Avoid strenuous activity or straining your pelvic muscles. SEEK MEDICAL CARE IF:  You have any vaginal bleeding during pregnancy. Treat this as a potential emergency.  You have cramping pain felt low in the stomach (stronger than menstrual cramps).  You notice vaginal discharge (watery, mucus, or bloody).  You have a low, dull backache.  There are regular contractions or uterine tightening. SEEK IMMEDIATE MEDICAL CARE IF: You have vaginal bleeding and have placenta previa.    This information is not intended to replace advice given to you by your health care provider. Make sure you discuss any questions you have  with your health care provider.   Document Released: 04/06/2011 Document Revised: 03/03/2012 Document Reviewed: 06/13/2015 Elsevier Interactive Patient Education 2016 [employer] Inc.    Ovarian Cyst An ovarian cyst is a sac filled with fluid or blood. This sac is attached to the ovary. Some cysts go away on their own. Other cysts need treatment.  HOME CARE   Only take medicine as told by your doctor.  Follow up with your doctor as told.  Get regular pelvic exams and Pap tests. GET HELP IF:  Your periods are late, not regular, or  painful.  You stop having periods.  Your belly (abdominal) or pelvic pain does not go away.  Your belly becomes large or puffy (swollen).  You have a hard time peeing (totally emptying your bladder).  You have pressure on your bladder.  You have pain during sex.  You feel fullness, pressure, or discomfort in your belly.  You lose weight for no reason.  You feel sick most of the time.  You have a hard time pooping (constipation).  You do not feel like eating.  You develop pimples (acne).  You have an increase in hair on your body and face.  You are gaining weight for no reason.  You think you are pregnant. GET HELP RIGHT AWAY IF:   Your belly pain gets worse.  You feel sick to your stomach (nauseous), and you throw up (vomit).  You have a fever that comes on fast.  You have belly pain while pooping (bowel movement).  Your periods are heavier than usual. MAKE SURE YOU:   Understand these instructions.  Will watch your condition.  Will get help right away if you are not doing well or get worse.   This information is not intended to replace advice given to you by your health care provider. Make sure you discuss any questions you have with your health care provider.   Document Released: 05/28/2008 Document Revised: 09/30/2013 Document Reviewed: 08/17/2013 Elsevier Interactive Patient Education [employer]

## 2016-07-29 NOTE — MAU Note (Signed)
Just noticed brown/red vag bleeding. Some abd cramping

## 2016-07-29 NOTE — Progress Notes (Signed)
Erin Lawrence NP in to discuss test results and d/c plan. Written and verbal d/c instructions given and understanding voiced. 

## 2016-07-29 NOTE — MAU Provider Note (Signed)
History     CSN: [id number]  Arrival date and time: 07/29/16 0214   First Provider Initiated Contact with Patient 07/29/16 0256      Chief Complaint  Patient presents with  . Vaginal Bleeding  . Abdominal Cramping   HPI Kari [name] is a 20 y.o. G3P0110 at [redacted]w[redacted]d who presents with vaginal bleeding & abdominal cramping. Reports brown spotting on toilet paper that she first noticed 2 hours ago. Reports lower abdominal cramping throughout pregnancy, no change tonight. Rates pain 3/10 & describes as pressure/cramping. No treatment. Denies aggravating or alleviating factors. Denies dysuria, n/v/d, constipation, vaginal discharge, or recent intercourse.  Concerned d/t poor OB history. Last seen in office last Wednesday, had ultrasound that was "normal".   OB History    Gravida Para Term Preterm AB Living   3 1   1 1  0   SAB TAB Ectopic Multiple Live Births   1     0        Past Medical History:  Diagnosis Date  . IUFD (intrauterine fetal death) 2015/05/18    Past Surgical History:  Procedure Laterality Date  . APPENDECTOMY    . HERNIA REPAIR      Family History  Problem Relation Age of Onset  . Alcohol abuse Neg Hx   . Arthritis Neg Hx   . Asthma Neg Hx   . Birth defects Neg Hx   . Cancer Neg Hx   . COPD Neg Hx   . Depression Neg Hx   . Diabetes Neg Hx   . Drug abuse Neg Hx   . Early death Neg Hx   . Hearing loss Neg Hx   . Heart disease Neg Hx   . Hyperlipidemia Neg Hx   . Hypertension Neg Hx   . Kidney disease Neg Hx   . Learning disabilities Neg Hx   . Mental illness Neg Hx   . Mental retardation Neg Hx   . Miscarriages / Stillbirths Neg Hx   . Stroke Neg Hx   . Vision loss Neg Hx   . Varicose Veins Neg Hx     Social History  Substance Use Topics  . Smoking status: Never Smoker  . Smokeless tobacco: Never Used  . Alcohol use No    Allergies: No Known Allergies  Prescriptions Prior to Admission  Medication Sig Dispense Refill Last Dose  . Prenat w/o  A Vit-FeFum-FePo-FA (CONCEPT OB) 130-92.4-1 MG CAPS Take 1 tablet by mouth daily. 30 capsule 12   . venlafaxine XR (EFFEXOR-XR) 75 MG 24 hr capsule Take 75 mg by mouth daily with breakfast.   Past Month at Unknown time    Review of Systems  Constitutional: Negative.   Gastrointestinal: Positive for abdominal pain. Negative for constipation, diarrhea, nausea and vomiting.  Genitourinary: Negative for dysuria.       + vaginal bleeding No vaginal discharge   Physical Exam   Blood pressure (!) 103/54, pulse 71, temperature 98 F (36.7 C), resp. rate 16, height 5\' 2"  (1.575 m), weight 104 lb 3.2 oz (47.3 kg), last menstrual period 06/05/2016, unknown if currently breastfeeding.  Physical Exam  Nursing note and vitals reviewed. Constitutional: She is oriented to person, place, and time. She appears well-developed and well-nourished. No distress.  HENT:  Head: Normocephalic and atraumatic.  Eyes: Conjunctivae are normal. Right eye exhibits no discharge. Left eye exhibits no discharge. No scleral icterus.  Neck: Normal range of motion.  Cardiovascular: Normal rate, regular rhythm and normal heart sounds.  No murmur heard. Respiratory: Effort normal and breath sounds normal. No respiratory distress. She has no wheezes.  GI: Soft. Bowel sounds are normal. There is no tenderness.  Genitourinary:  Genitourinary Comments: Small amount of brown discharge/old blood Cervix closed  Neurological: She is alert and oriented to person, place, and time.  Skin: Skin is warm and dry. She is not diaphoretic.  Psychiatric: She has a normal mood and affect. Her behavior is normal. Judgment and thought content normal.    MAU Course  Procedures Results for orders placed or performed during the hospital encounter of 07/29/16 (from the past 24 hour(s))  Urinalysis, Routine w reflex microscopic (not at [facility])     Status: Abnormal   Collection Time: 07/29/16  2:40 AM  Result Value Ref Range   Color, Urine  YELLOW YELLOW   APPearance CLEAR CLEAR   Specific Gravity, Urine 1.020 1.005 - 1.030   pH 6.0 5.0 - 8.0   Glucose, UA NEGATIVE NEGATIVE mg/dL   Hgb urine dipstick MODERATE (A) NEGATIVE   Bilirubin Urine NEGATIVE NEGATIVE   Ketones, ur 15 (A) NEGATIVE mg/dL   Protein, ur NEGATIVE NEGATIVE mg/dL   Nitrite NEGATIVE NEGATIVE   Leukocytes, UA NEGATIVE NEGATIVE  Urine microscopic-add on     Status: Abnormal   Collection Time: 07/29/16  2:40 AM  Result Value Ref Range   Squamous Epithelial / LPF 0-5 (A) NONE SEEN   WBC, UA 0-5 0 - 5 WBC/hpf   RBC / HPF 0-5 0 - 5 RBC/hpf   Bacteria, UA FEW (A) NONE SEEN   [place] Ob Transvaginal  Result Date: 07/29/2016 CLINICAL DATA:  Vaginal bleeding in first-trimester pregnancy. Bleeding for 1 hour. EXAM: TRANSVAGINAL OB ULTRASOUND TECHNIQUE: Transvaginal ultrasound was performed for complete evaluation of the gestation as well as the maternal uterus, adnexal regions, and pelvic cul-de-sac. COMPARISON:  Obstetrical ultrasound 07/18/2016 FINDINGS: Intrauterine gestational sac: Single Yolk sac:  Present. Embryo:  Present. Cardiac Activity: Present. Heart Rate: 166 bpm CRL:   15  mm   7 w 6 d                  US EDC: 03/11/2017 Subchorionic hemorrhage:  None visualized. Maternal uterus/adnexae: Right ovarian cyst measures 4.7 x 5.4 x 5.5 cm, minimally increased in size from prior (previously 5.0 x 4.6 x 4.8 cm). The previous thin internal septations are not as well visualized currently. Blood flow seen to the surrounding right ovarian parenchyma. Left ovary is not visualized. Trace free fluid in the cul-de-sac is similar. IMPRESSION: 1. Single live intrauterine pregnancy measuring 7 weeks 6 days for estimated date of delivery 03/11/2017. No subchorionic hemorrhage. There has been appropriate interval growth from prior exam. 2. Right ovarian cyst has minimally increased in size, currently 5.5 cm maximal dimension. The thin septations previously are not well visualized on the  current exam. There is no evidence of torsion, blood flow seen to the ovarian parenchyma. Continued sonographic follow-up is recommended in 4 weeks. Electronically Signed   By: Rubye OaksMelanie  Ehinger M.D.   On: 07/29/2016 03:58    MDM Ultrasound shows SIUP with cardiac activity, no SCH. Stable right ovarian cyst with normal doppler flow.  S/w Dr. Langston MaskerMorris about assessment & ultrasound results. Pt ok to discharge home & keep f/u in office.   Assessment and Plan  A: 1. Normal IUP (intrauterine pregnancy) on prenatal ultrasound, first trimester   2. Vaginal bleeding in pregnancy, first trimester   3. Right ovarian cyst  P: Discharge home Pelvic rest Discussed reasons to return to MAU Keep f/u with OB  Judeth Horn 07/29/2016, 2:55 AM

## 2016-08-08 LAB — OB RESULTS CONSOLE HIV ANTIBODY (ROUTINE TESTING): HIV: NONREACTIVE

## 2016-08-08 LAB — OB RESULTS CONSOLE RPR: RPR: NONREACTIVE

## 2016-08-08 LAB — OB RESULTS CONSOLE ABO/RH: RH TYPE: POSITIVE

## 2016-08-08 LAB — OB RESULTS CONSOLE HEPATITIS B SURFACE ANTIGEN: HEP B S AG: NEGATIVE

## 2016-08-08 LAB — OB RESULTS CONSOLE ANTIBODY SCREEN: Antibody Screen: NEGATIVE

## 2016-08-08 LAB — OB RESULTS CONSOLE RUBELLA ANTIBODY, IGM: Rubella: IMMUNE

## 2016-08-17 LAB — OB RESULTS CONSOLE GC/CHLAMYDIA
Chlamydia: NEGATIVE
Gonorrhea: NEGATIVE

## 2016-10-17 ENCOUNTER — Inpatient Hospital Stay (HOSPITAL_COMMUNITY)
Admission: AD | Admit: 2016-10-17 | Discharge: 2016-10-18 | Disposition: A | Discharge status: Home / Self Care | Payer: Commercial Managed Care - HMO | Source: Ambulatory Visit | Attending: Obstetrics & Gynecology | Admitting: Obstetrics & Gynecology

## 2016-10-17 ENCOUNTER — Encounter (HOSPITAL_COMMUNITY): Payer: Self-pay

## 2016-10-17 DIAGNOSIS — O479 False labor, unspecified: Secondary | ICD-10-CM

## 2016-10-17 DIAGNOSIS — Z3A19 19 weeks gestation of pregnancy: Secondary | ICD-10-CM | POA: Diagnosis not present

## 2016-10-17 DIAGNOSIS — R102 Pelvic and perineal pain: Secondary | ICD-10-CM | POA: Insufficient documentation

## 2016-10-17 DIAGNOSIS — O26892 Other specified pregnancy related conditions, second trimester: Secondary | ICD-10-CM | POA: Insufficient documentation

## 2016-10-17 DIAGNOSIS — O4702 False labor before 37 completed weeks of gestation, second trimester: Secondary | ICD-10-CM | POA: Insufficient documentation

## 2016-10-17 DIAGNOSIS — O26899 Other specified pregnancy related conditions, unspecified trimester: Secondary | ICD-10-CM

## 2016-10-17 LAB — URINALYSIS, ROUTINE W REFLEX MICROSCOPIC
Bilirubin Urine: NEGATIVE
GLUCOSE, UA: NEGATIVE mg/dL
HGB URINE DIPSTICK: NEGATIVE
Ketones, ur: NEGATIVE mg/dL
Leukocytes, UA: NEGATIVE
Nitrite: NEGATIVE
PH: 6 (ref 5.0–8.0)
PROTEIN: NEGATIVE mg/dL
Specific Gravity, Urine: 1.025 (ref 1.005–1.030)

## 2016-10-17 NOTE — MAU Note (Signed)
Pt states she was seen in office 2 days ago , states the MD checked her cervix and told her she was dilated and to come in for any cramping. States she was cramping for about 30 minutes at 2200.

## 2016-10-17 NOTE — MAU Provider Note (Signed)
History     CSN: [id number]  Arrival date and time: 10/17/16 2308   First Provider Initiated Contact with Patient 10/17/16 2344      Chief Complaint  Patient presents with  . Pelvic Pain   Kari [name] is a 20 y.o. G3P0110 at 7471w3d who presents today with cramping. She states that the cramping started around 2210, and has slowly been getting better. She was seen in the office a couple of days ago, and states that she was told her cervix was 3cm. She was advised to come in with any cramping. She has an appointment on 10/18/16.   Pelvic Pain  The patient's primary symptoms include pelvic pain. This is a new problem. The current episode started today (around 2210 ). The problem occurs intermittently. The problem has been gradually improving. Pain severity now: 4/10  The problem affects both sides. She is pregnant. Associated symptoms include abdominal pain. Pertinent negatives include no chills, constipation, diarrhea, dysuria, fever, frequency, nausea, urgency or vomiting. The vaginal discharge was normal. There has been no bleeding. Nothing aggravates the symptoms. She has tried nothing for the symptoms. Sexual activity: No intercourse in the last 24 hours.  Menstrual history: LMp 06/05/16    Past Medical History:  Diagnosis Date  . IUFD (intrauterine fetal death) 2016    Past Surgical History:  Procedure Laterality Date  . APPENDECTOMY    . HERNIA REPAIR      Family History  Problem Relation Age of Onset  . Alcohol abuse Neg Hx   . Arthritis Neg Hx   . Asthma Neg Hx   . Birth defects Neg Hx   . Cancer Neg Hx   . COPD Neg Hx   . Depression Neg Hx   . Diabetes Neg Hx   . Drug abuse Neg Hx   . Early death Neg Hx   . Hearing loss Neg Hx   . Heart disease Neg Hx   . Hyperlipidemia Neg Hx   . Hypertension Neg Hx   . Kidney disease Neg Hx   . Learning disabilities Neg Hx   . Mental illness Neg Hx   . Mental retardation Neg Hx   . Miscarriages / Stillbirths Neg Hx    . Stroke Neg Hx   . Vision loss Neg Hx   . Varicose Veins Neg Hx     Social History  Substance Use Topics  . Smoking status: Never Smoker  . Smokeless tobacco: Never Used  . Alcohol use No    Allergies: No Known Allergies  Prescriptions Prior to Admission  Medication Sig Dispense Refill Last Dose  . Prenat w/o A Vit-FeFum-FePo-FA (CONCEPT OB) 130-92.4-1 MG CAPS Take 1 tablet by mouth daily. 30 capsule 12   . venlafaxine XR (EFFEXOR-XR) 75 MG 24 hr capsule Take 75 mg by mouth daily with breakfast.   Past Month at Unknown time    Review of Systems  Constitutional: Negative for chills and fever.  Gastrointestinal: Positive for abdominal pain. Negative for constipation, diarrhea, nausea and vomiting.  Genitourinary: Positive for pelvic pain. Negative for dysuria, frequency and urgency.   Physical Exam   Blood pressure 98/61, pulse 86, temperature 98.6 F (37 C), temperature source Oral, resp. rate 15, height 5\' 2"  (1.575 m), weight 111 lb (50.3 kg), last menstrual period 06/05/2016, SpO2 100 %, unknown if currently breastfeeding.  Physical Exam  Nursing note and vitals reviewed. Constitutional: She is oriented to person, place, and time. She appears well-developed and well-nourished. No distress.  HENT:  Head: Normocephalic.  Cardiovascular: Normal rate.   Respiratory: Effort normal.  GI: Soft. There is no tenderness. There is no rebound.  Genitourinary:  Genitourinary Comments:  External: no lesion Vagina: small amount of white discharge Cervix: pink, smooth, no CMT 2.5cm at ext os/closed at int os/thick/balottable.  Uterus: AGA, FHT 147 with doppler   Neurological: She is alert and oriented to person, place, and time.  Skin: Skin is warm.  Psychiatric: She has a normal mood and affect.   Results for orders placed or performed during the hospital encounter of 10/17/16 (from the past 24 hour(s))  Urinalysis, Routine w reflex microscopic (not at [facility])     Status: None    Collection Time: 10/17/16 11:27 PM  Result Value Ref Range   Color, Urine YELLOW YELLOW   APPearance CLEAR CLEAR   Specific Gravity, Urine 1.025 1.005 - 1.030   pH 6.0 5.0 - 8.0   Glucose, UA NEGATIVE NEGATIVE mg/dL   Hgb urine dipstick NEGATIVE NEGATIVE   Bilirubin Urine NEGATIVE NEGATIVE   Ketones, ur NEGATIVE NEGATIVE mg/dL   Protein, ur NEGATIVE NEGATIVE mg/dL   Nitrite NEGATIVE NEGATIVE   Leukocytes, UA NEGATIVE NEGATIVE  Wet prep, genital     Status: Abnormal   Collection Time: 10/17/16 11:50 PM  Result Value Ref Range   Yeast Wet Prep HPF POC NONE SEEN NONE SEEN   Trich, Wet Prep NONE SEEN NONE SEEN   Clue Cells Wet Prep HPF POC NONE SEEN NONE SEEN   WBC, Wet Prep HPF POC MODERATE (A) NONE SEEN   Sperm NONE SEEN     MAU Course  Procedures  MDM 0018: D/W Dr. Langston Masker, ok for DC home and FU in the office tomorrow as planned.   Assessment and Plan   1. Pain of round ligament complicating pregnancy, antepartum   2. [redacted] weeks gestation of pregnancy   3. Braxton Hicks contractions    DC home Comfort measures reviewed  2nd Trimester precautions  PTL precautions  Fetal kick counts RX: none  Return to MAU as needed FU with OB as planned  Follow-up Information    LOWE,DAVID C, MD .   Specialty:  Obstetrics and Gynecology Contact information: [address] [place], [address] [place] [address] [phone]            Tawnya Crook 10/17/2016, 11:46 PM

## 2016-10-18 DIAGNOSIS — O26892 Other specified pregnancy related conditions, second trimester: Secondary | ICD-10-CM | POA: Diagnosis not present

## 2016-10-18 DIAGNOSIS — Z3A19 19 weeks gestation of pregnancy: Secondary | ICD-10-CM | POA: Diagnosis not present

## 2016-10-18 DIAGNOSIS — R102 Pelvic and perineal pain: Secondary | ICD-10-CM | POA: Diagnosis not present

## 2016-10-18 DIAGNOSIS — O4702 False labor before 37 completed weeks of gestation, second trimester: Secondary | ICD-10-CM

## 2016-10-18 LAB — WET PREP, GENITAL
CLUE CELLS WET PREP: NONE SEEN
Sperm: NONE SEEN
TRICH WET PREP: NONE SEEN
YEAST WET PREP: NONE SEEN

## 2016-10-18 LAB — GC/CHLAMYDIA PROBE AMP (~~LOC~~) NOT AT ARMC
Chlamydia: NEGATIVE
Neisseria Gonorrhea: NEGATIVE

## 2016-10-18 NOTE — Discharge Instructions (Signed)
Round Ligament Pain during Pregnancy Many women will experience a type of pain referred to as round ligament pain during their pregnancy. This is associated with abdominal pain or discomfort. Since any type of abdominal pain during pregnancy can be disconcerting, it is important to talk about round ligament pain to relieve any anxiety or fears you may have regarding the symptoms you are feeling. Round ligament pain is due to normal changes that take place in the body during pregnancy. It is caused by stretching of the round ligaments attached to the uterus. More commonly it occurs on the right side of the pelvis. Round Ligament: An Overview Typically in the non-pregnant state the uterus is about the size of an apple or pear. There are thick ligaments which hold the uterus in place in the abdomen, referred to as round ligaments. During pregnancy, your uterus will expand in size and weight, and the ligaments supporting it will have to stretch, becoming longer and thinner. As these ligaments pull and tug they may irritate nearby nerve fibers, which causes pain. The severity of the pain in some cases can seem extreme. Some common symptoms of round ligament pain include:  Ligament spasms or contractions/cramps that trigger a sharp pain typically on the right side of the abdomen.  Pain upon waking or suddenly rolling over in your sleep.  Pain in the abdomen that is sharp brought on by exercise or other vigorous activity. Similar Problems Round ligament pain is often mistaken for other medical conditions because the symptoms are similar. Acute abdominal pain during pregnancy may also be a sign of other conditions including:  Abdominal cramps - Some abdominal pain is simply caused by change in bowel habits associated with pregnancy. Gas is a common problem that can cause sharp, shooting pain. You should always seek out medical care if your pain is accompanied by fever, chills, pain  upon urination or if you have difficulty walking. Further exams and tests will be conducted to ensure that you do not have a more serious condition. It is not uncommon for women with lower abdominal pain to have a urinary tract infection, thus you may also be asked for a urine sample. Treatment If all other conditions are ruled out you can treat your round ligament pain relatively easily. You may be advised to take some acetaminophen (Tylenol) to reduce the severity of any persistent pain and asked to reduce your activity level. You can apply a heating pad to the area of pain or take a warm bath. Lying on the opposite side of the pain may help as well. Most women will find relief from round ligament pain simply by altering their daily routines slightly. The good news is round ligament pain will disappear completely once you have given birth to your child!  Braxton Hicks Contractions Contractions of the uterus can occur throughout pregnancy. Contractions are not always a sign that you are in labor.  WHAT ARE BRAXTON HICKS CONTRACTIONS?  Contractions that occur before labor are called Braxton Hicks contractions, or false labor. Toward the end of pregnancy (32-34 weeks), these contractions can develop more often and may become more forceful. This is not true labor because these contractions do not result in opening (dilatation) and thinning of the cervix. They are sometimes difficult to tell apart from true labor because these contractions can be forceful and people have different pain tolerances. You should not feel embarrassed if you go to the hospital with false labor. Sometimes, the only way to tell if  you are in true labor is for your health care provider to look for changes in the cervix. If there are no prenatal problems or other health problems associated with the pregnancy, it is completely safe to be sent home with false labor and await the onset of true labor. HOW CAN YOU TELL THE  DIFFERENCE BETWEEN TRUE AND FALSE LABOR? False Labor  The contractions of false labor are usually shorter and not as hard as those of true labor.   The contractions are usually irregular.   The contractions are often felt in the front of the lower abdomen and in the groin.   The contractions may go away when you walk around or change positions while lying down.   The contractions get weaker and are shorter lasting as time goes on.   The contractions do not usually become progressively stronger, regular, and closer together as with true labor.  True Labor  Contractions in true labor last 30-70 seconds, become very regular, usually become more intense, and increase in frequency.   The contractions do not go away with walking.   The discomfort is usually felt in the top of the uterus and spreads to the lower abdomen and low back.   True labor can be determined by your health care provider with an exam. This will show that the cervix is dilating and getting thinner.  WHAT TO REMEMBER  Keep up with your usual exercises and follow other instructions given by your health care provider.   Take medicines as directed by your health care provider.   Keep your regular prenatal appointments.   Eat and drink lightly if you think you are going into labor.   If Braxton Hicks contractions are making you uncomfortable:   Change your position from lying down or resting to walking, or from walking to resting.   Sit and rest in a tub of warm water.   Drink 2-3 glasses of water. Dehydration may cause these contractions.   Do slow and deep breathing several times an hour.  WHEN SHOULD I SEEK IMMEDIATE MEDICAL CARE? Seek immediate medical care if:  Your contractions become stronger, more regular, and closer together.   You have fluid leaking or gushing from your vagina.   You have a fever.   You pass blood-tinged mucus.   You have vaginal bleeding.   You have  continuous abdominal pain.   You have low back pain that you never had before.   You feel your baby's head pushing down and causing pelvic pressure.   Your baby is not moving as much as it used to.    This information is not intended to replace advice given to you by your health care provider. Make sure you discuss any questions you have with your health care provider.   Document Released: 12/10/2005 Document Revised: 12/15/2013 Document Reviewed: 09/21/2013 Elsevier Interactive Patient Education 2016 [employer]. Preterm Labor Information Preterm labor is when labor starts at less than 37 weeks of pregnancy. The normal length of a pregnancy is 39 to 41 weeks. CAUSES Often, there is no identifiable underlying cause as to why a woman goes into preterm labor. One of the most common known causes of preterm labor is infection. Infections of the uterus, cervix, vagina, amniotic sac, bladder, kidney, or even the lungs (pneumonia) can cause labor to start. Other suspected causes of preterm labor include:  Urogenital infections, such as yeast infections and bacterial vaginosis.  Uterine abnormalities (uterine shape, uterine septum, fibroids, or  bleeding from the placenta).  A cervix that has been operated on (it may fail to stay closed).  Malformations in the fetus.  Multiple gestations (twins, triplets, and so on).  Breakage of the amniotic sac.  RISK FACTORS Having a previous history of preterm labor.  Having premature rupture of membranes (PROM).  Having a placenta that covers the opening of the cervix (placenta previa).  Having a placenta that separates from the uterus (placental abruption).  Having a cervix that is too weak to hold the fetus in the uterus (incompetent cervix).  Having too much fluid in the amniotic sac (polyhydramnios).  Taking illegal drugs or smoking while pregnant.  Not gaining enough weight while pregnant.  Being younger than 7518 and older than 19  years old.  Having a low socioeconomic status.  Being African American. SYMPTOMS Signs and symptoms of preterm labor include:  Menstrual-like cramps, abdominal pain, or back pain. Uterine contractions that are regular, as frequent as six in an hour, regardless of their intensity (may be mild or painful). Contractions that start on the top of the uterus and spread down to the lower abdomen and back.  A sense of increased pelvic pressure.  A watery or bloody mucus discharge that comes from the vagina.  TREATMENT Depending on the length of the pregnancy and other circumstances, your health care provider may suggest bed rest. If necessary, there are medicines that can be given to stop contractions and to mature the fetal lungs. If labor happens before 34 weeks of pregnancy, a prolonged hospital stay may be recommended. Treatment depends on the condition of both you and the fetus.  WHAT SHOULD YOU DO IF YOU THINK YOU ARE IN PRETERM LABOR? Call your health care provider right away. You will need to go to the hospital to get checked immediately. HOW CAN YOU PREVENT PRETERM LABOR IN FUTURE PREGNANCIES? You should:  Stop smoking if you smoke. Maintain healthy weight gain and avoid chemicals and drugs that are not necessary. Be watchful for any type of infection. Inform your health care provider if you have a known history of preterm labor.   This information is not intended to replace advice given to you by your health care provider. Make sure you discuss any questions you have with your health care provider.   Document Released: 03/01/2004 Document Revised: 08/12/2013 Document Reviewed: 01/12/2013 Elsevier Interactive Patient Education [employer]2016 [employer]

## 2016-11-12 ENCOUNTER — Inpatient Hospital Stay (HOSPITAL_COMMUNITY): Payer: Commercial Managed Care - HMO

## 2016-11-12 ENCOUNTER — Inpatient Hospital Stay (HOSPITAL_COMMUNITY)
Admission: AD | Admit: 2016-11-12 | Discharge: 2016-11-12 | Disposition: A | Discharge status: Home / Self Care | Payer: Commercial Managed Care - HMO | Source: Ambulatory Visit | Attending: Obstetrics and Gynecology | Admitting: Obstetrics and Gynecology

## 2016-11-12 ENCOUNTER — Encounter (HOSPITAL_COMMUNITY): Payer: Self-pay | Admitting: *Deleted

## 2016-11-12 DIAGNOSIS — R109 Unspecified abdominal pain: Secondary | ICD-10-CM | POA: Diagnosis present

## 2016-11-12 DIAGNOSIS — O4700 False labor before 37 completed weeks of gestation, unspecified trimester: Secondary | ICD-10-CM

## 2016-11-12 DIAGNOSIS — O4702 False labor before 37 completed weeks of gestation, second trimester: Secondary | ICD-10-CM

## 2016-11-12 DIAGNOSIS — O4693 Antepartum hemorrhage, unspecified, third trimester: Secondary | ICD-10-CM

## 2016-11-12 DIAGNOSIS — Z3A23 23 weeks gestation of pregnancy: Secondary | ICD-10-CM | POA: Diagnosis not present

## 2016-11-12 HISTORY — DX: Headache, unspecified: R51.9

## 2016-11-12 HISTORY — DX: Headache: R51

## 2016-11-12 HISTORY — DX: Anxiety disorder, unspecified: F41.9

## 2016-11-12 HISTORY — DX: Anemia, unspecified: D64.9

## 2016-11-12 HISTORY — DX: Major depressive disorder, single episode, unspecified: F32.9

## 2016-11-12 HISTORY — DX: Depression, unspecified: F32.A

## 2016-11-12 LAB — URINALYSIS, ROUTINE W REFLEX MICROSCOPIC
BILIRUBIN URINE: NEGATIVE
Glucose, UA: NEGATIVE mg/dL
Hgb urine dipstick: NEGATIVE
KETONES UR: NEGATIVE mg/dL
NITRITE: NEGATIVE
PH: 7 (ref 5.0–8.0)
PROTEIN: NEGATIVE mg/dL
Specific Gravity, Urine: 1.01 (ref 1.005–1.030)

## 2016-11-12 LAB — URINE MICROSCOPIC-ADD ON

## 2016-11-12 MED ORDER — TERBUTALINE SULFATE 1 MG/ML IJ SOLN
0.2500 mg | Freq: Once | INTRAMUSCULAR | Status: AC
  Administered 2016-11-12: 0.25 mg via SUBCUTANEOUS

## 2016-11-12 MED ORDER — TERBUTALINE SULFATE 1 MG/ML IJ SOLN
INTRAMUSCULAR | Status: AC
  Filled 2016-11-12: qty 1

## 2016-11-12 NOTE — MAU Provider Note (Signed)
Chief Complaint:  No chief complaint on file.   First Provider Initiated Contact with Patient 11/12/16 2007     HPI: Kari [name] is a 20 y.o. G3P0110 at 6323w1dwho presents to maternity admissions reporting cramping and a little spotting today.  Cramping started yesterday.. She reports good fetal movement, denies LOF, vaginal bleeding, vaginal itching/burning, urinary symptoms, h/a, dizziness, n/v, diarrhea, constipation or fever/chills.  She denies headache, visual changes or RUQ abdominal pain.  Abdominal Pain  This is a new problem. The current episode started yesterday. The onset quality is gradual. The problem occurs intermittently. The problem has been waxing and waning. The pain is located in the LLQ and RLQ. The quality of the pain is cramping. The abdominal pain does not radiate. Pertinent negatives include no constipation, diarrhea, fever, myalgias, nausea or vomiting. Nothing aggravates the pain. The pain is relieved by nothing. She has tried nothing for the symptoms.   RN Note: Pt G3P0 with c/o mild, lower, intermittent abdominal cramping for the past 24 hrs. She tried a hot shower which helped some. States she saw a very scant  amt of pink mucous today X1.  Feels good fetal movement.      Electronically signed by Russella DarKristin A Weiss, RN at 11/12/2016      Past Medical History: Past Medical History:  Diagnosis Date  . Anemia   . Anxiety   . Depression   . Headache    migrains  . IUFD (intrauterine fetal death) 2016    Past obstetric history: OB History  Gravida Para Term Preterm AB Living  3 1   1 1  0  SAB TAB Ectopic Multiple Live Births  1     0      # Outcome Date GA Lbr Len/2nd Weight Sex Delivery Anes PTL Lv  3 Current           2 SAB 01/2016          1 Preterm 10/08/15 4366w0d 03:20 / 00:02 3 lb 0.9 oz (1.385 kg) M Vag-Spont EPI  FD     Birth Comments: none      Past Surgical History: Past Surgical History:  Procedure Laterality Date  . APPENDECTOMY     . HERNIA REPAIR      Family History: Family History  Problem Relation Age of Onset  . Alcohol abuse Neg Hx   . Arthritis Neg Hx   . Asthma Neg Hx   . Birth defects Neg Hx   . Cancer Neg Hx   . COPD Neg Hx   . Depression Neg Hx   . Diabetes Neg Hx   . Drug abuse Neg Hx   . Early death Neg Hx   . Hearing loss Neg Hx   . Heart disease Neg Hx   . Hyperlipidemia Neg Hx   . Hypertension Neg Hx   . Kidney disease Neg Hx   . Learning disabilities Neg Hx   . Mental illness Neg Hx   . Mental retardation Neg Hx   . Miscarriages / Stillbirths Neg Hx   . Stroke Neg Hx   . Vision loss Neg Hx   . Varicose Veins Neg Hx     Social History: Social History  Substance Use Topics  . Smoking status: Never Smoker  . Smokeless tobacco: Never Used  . Alcohol use No    Allergies: No Known Allergies  Meds:  Prescriptions Prior to Admission  Medication Sig Dispense Refill Last Dose  . Prenat w/o A  Vit-FeFum-FePo-FA (CONCEPT OB) 130-92.4-1 MG CAPS Take 1 tablet by mouth daily. 30 capsule 12 11/11/2016 at Unknown time  . venlafaxine XR (EFFEXOR-XR) 75 MG 24 hr capsule Take 75 mg by mouth daily with breakfast.   Past Month at Unknown time    I have reviewed patient's Past Medical Hx, Surgical Hx, Family Hx, Social Hx, medications and allergies.   ROS:  Review of Systems  Constitutional: Negative for fever.  Gastrointestinal: Positive for abdominal pain. Negative for constipation, diarrhea, nausea and vomiting.  Musculoskeletal: Negative for myalgias.   Other systems negative  Physical Exam  There were no vitals filed for this visit.  Constitutional: Well-developed, well-nourished female in no acute distress.  Cardiovascular: normal rate and rhythm Respiratory: normal effort, clear to auscultation bilaterally GI: Abd soft, non-tender, gravid appropriate for gestational age.   No rebound or guarding. MS: Extremities nontender, no edema, normal ROM Neurologic: Alert and oriented x 4.   GU: Neg CVAT.  PELVIC EXAM: Cervix pink, visually closed, without lesion, scant white creamy discharge with one spot of blood, vaginal walls and external genitalia normal  Cervix 1cm ext os, FT internal os, thick, high    FHT:  150 , Contractions: Uterine irritability   Labs: Results for orders placed or performed during the hospital encounter of 11/12/16 (from the past 24 hour(s))  Urinalysis, Routine w reflex microscopic (not at [facility])     Status: Abnormal   Collection Time: 11/12/16  7:25 PM  Result Value Ref Range   Color, Urine YELLOW YELLOW   APPearance CLEAR CLEAR   Specific Gravity, Urine 1.010 1.005 - 1.030   pH 7.0 5.0 - 8.0   Glucose, UA NEGATIVE NEGATIVE mg/dL   Hgb urine dipstick NEGATIVE NEGATIVE   Bilirubin Urine NEGATIVE NEGATIVE   Ketones, ur NEGATIVE NEGATIVE mg/dL   Protein, ur NEGATIVE NEGATIVE mg/dL   Nitrite NEGATIVE NEGATIVE   Leukocytes, UA TRACE (A) NEGATIVE  Urine microscopic-add on     Status: Abnormal   Collection Time: 11/12/16  7:25 PM  Result Value Ref Range   Squamous Epithelial / LPF 0-5 (A) NONE SEEN   WBC, UA 0-5 0 - 5 WBC/hpf   RBC / HPF 0-5 0 - 5 RBC/hpf   Bacteria, UA FEW (A) NONE SEEN    Imaging:  Cervical length ordered  MAU Course/MDM: I have ordered labs and reviewed results.  Toco reviewed Consult Dr Marcelle Overlie with presentation, exam findings and test results.  Treatments in MAU included Terbulaline and [place] ordered for cervical length.    2155: Patient has had terb. She reports that the cramping has stopped. US shows cervical length 2.7cm. Patient has visit in the office on 11/13/16.  2157: D/W Dr. Marcelle Overlie. Ok for DC home. Rest tonight and they will FU in the office tomorrow.   Assessment: SIUP at [redacted]w[redacted]d Uterine irritability Slight cervical dilation  Plan: Report given to oncoming CNM   1. Threatened premature labor, antepartum   2. Antepartum bleeding, third trimester   3. [redacted] weeks gestation of pregnancy    DC  home Comfort measures reviewed  2nd Trimester precautions  Pelvic rest PTL precautions  Fetal kick counts RX: none Return to MAU as needed FU with OB as planned  Follow-up Information    Meriel Pica, MD Follow up.   Specialty:  Obstetrics and Gynecology Contact information: [address] [place] [address] [phone]              Wynelle Bourgeois CNM,  MSN Certified Nurse-Midwife 11/12/2016 8:07 PM

## 2016-11-12 NOTE — Discharge Instructions (Signed)
Pelvic Rest Introduction Pelvic rest may be recommended if:  Your placenta is partially or completely covering the opening of your cervix (placenta previa).  There is bleeding between the wall of the uterus and the amniotic sac in the first trimester of pregnancy (subchorionic hemorrhage).  You went into labor too early (preterm labor). Based on your overall health and the health of your baby, your health care provider will decide if pelvic rest is right for you. How do I rest my pelvis? For as long as told by your health care provider:  Do not have sex, sexual stimulation, or an orgasm.  Do not use tampons. Do not douche. Do not put anything in your vagina.  Do not lift anything that is heavier than 10 lb (4.5 kg).  Avoid activities that take a lot of effort (are strenuous).  Avoid any activity in which your pelvic muscles could become strained. When should I seek medical care? Seek medical care if you have:  Cramping pain in your lower abdomen.  Vaginal discharge.  A low, dull backache.  Regular contractions.  Uterine tightening. When should I seek immediate medical care? Seek immediate medical care if:  You have vaginal bleeding and you are pregnant. This information is not intended to replace advice given to you by your health care provider. Make sure you discuss any questions you have with your health care provider. Document Released: 04/06/2011 Document Revised: 05/17/2016 Document Reviewed: 06/13/2015  2017 Elsevier

## 2016-11-12 NOTE — MAU Note (Signed)
Pt G3P0 with c/o mild, lower, intermittent abdominal cramping for the past 24 hrs. She tried a hot shower which helped some. States she saw a very scant  amt of pink mucous today X1.  Feels good fetal movement.

## 2016-12-24 NOTE — L&D Delivery Note (Addendum)
Delivery Note At 8:16 AM a viable female was delivered via Vaginal, Spontaneous Delivery (Presentation: ;  ).  APGAR: 9, 9; Cord pH: sent Progressed to complete. Pushed to delivery a viable female infant in OA presentation.  weight pending. Placenta unremarkable. No complications. No tears.  Anesthesia:   Episiotomy: None Lacerations: None Suture Repair: N/A Est. Blood Loss (mL): 200  It's a boy - "quaylan"!!   Mom to postpartum.  Baby to Couplet care / Skin to Skin.  Ranae Pilalise Jennifer Diandra Cimini 02/27/2017, 9:56 AM

## 2017-01-25 ENCOUNTER — Encounter (HOSPITAL_COMMUNITY): Payer: Self-pay

## 2017-01-25 ENCOUNTER — Inpatient Hospital Stay (EMERGENCY_DEPARTMENT_HOSPITAL)
Admission: AD | Admit: 2017-01-25 | Discharge: 2017-01-26 | Disposition: A | Discharge status: Home / Self Care | Payer: Commercial Managed Care - HMO | Source: Ambulatory Visit | Attending: Obstetrics and Gynecology | Admitting: Obstetrics and Gynecology

## 2017-01-25 DIAGNOSIS — Z3A33 33 weeks gestation of pregnancy: Secondary | ICD-10-CM

## 2017-01-25 DIAGNOSIS — O4703 False labor before 37 completed weeks of gestation, third trimester: Secondary | ICD-10-CM

## 2017-01-25 NOTE — MAU Provider Note (Signed)
Chief Complaint:  Vaginal Discharge   First Provider Initiated Contact with Patient 01/25/17 2337     HPI: Kari JourneyCheyenne C Westerhold is a 21 y.o. G3P0110 at 4733w5dwho presents to maternity admissions reporting Several episodes of leaking of fluid.  States soaked underwear but not sheets.  Does not feel any preterm contractions. No history of preterm labor, but did have a stillbirth at 30 weeks but was induced.. She reports good fetal movement, denies LOF, vaginal bleeding, vaginal itching/burning, urinary symptoms, h/a, dizziness, n/v, diarrhea, constipation or fever/chills.    Vaginal Discharge  The patient's primary symptoms include missed menses and vaginal discharge. The patient's pertinent negatives include no genital itching, genital lesions, genital odor, genital rash, pelvic pain or vaginal bleeding. This is a new problem. The current episode started today. The problem occurs intermittently. The problem has been waxing and waning. The patient is experiencing no pain. The problem affects both sides. She is pregnant. Pertinent negatives include no abdominal pain, back pain, chills, constipation, diarrhea, fever, headaches, nausea or vomiting. The vaginal discharge was clear. There has been no bleeding. She has not been passing clots. She has not been passing tissue. Nothing aggravates the symptoms. She has tried nothing for the symptoms.   RN Note: I was laying down tonight and leaked small amt clear fld. I have leaked 3-4 times. First time was about 2000. No pain  Past Medical History: Past Medical History:  Diagnosis Date  . Anemia   . Anxiety   . Depression   . Headache    migrains  . IUFD (intrauterine fetal death) 2016    Past obstetric history: OB History  Gravida Para Term Preterm AB Living  3 1   1 1  0  SAB TAB Ectopic Multiple Live Births  1     0      # Outcome Date GA Lbr Len/2nd Weight Sex Delivery Anes PTL Lv  3 Current           2 SAB 01/2016          1 Preterm 10/08/15  9640w0d 03:20 / 00:02 3 lb 0.9 oz (1.385 kg) M Vag-Spont EPI  FD     Birth Comments: none      Past Surgical History: Past Surgical History:  Procedure Laterality Date  . APPENDECTOMY    . HERNIA REPAIR      Family History: Family History  Problem Relation Age of Onset  . Alcohol abuse Neg Hx   . Arthritis Neg Hx   . Asthma Neg Hx   . Birth defects Neg Hx   . Cancer Neg Hx   . COPD Neg Hx   . Depression Neg Hx   . Diabetes Neg Hx   . Drug abuse Neg Hx   . Early death Neg Hx   . Hearing loss Neg Hx   . Heart disease Neg Hx   . Hyperlipidemia Neg Hx   . Hypertension Neg Hx   . Kidney disease Neg Hx   . Learning disabilities Neg Hx   . Mental illness Neg Hx   . Mental retardation Neg Hx   . Miscarriages / Stillbirths Neg Hx   . Stroke Neg Hx   . Vision loss Neg Hx   . Varicose Veins Neg Hx     Social History: Social History  Substance Use Topics  . Smoking status: Never Smoker  . Smokeless tobacco: Never Used  . Alcohol use No    Allergies: No Known Allergies  Meds:  Prescriptions Prior to Admission  Medication Sig Dispense Refill Last Dose  . Prenat w/o A Vit-FeFum-FePo-FA (CONCEPT OB) 130-92.4-1 MG CAPS Take 1 tablet by mouth daily. 30 capsule 12 11/11/2016 at Unknown time    I have reviewed patient's Past Medical Hx, Surgical Hx, Family Hx, Social Hx, medications and allergies.   ROS:  Review of Systems  Constitutional: Negative for chills and fever.  Gastrointestinal: Negative for abdominal pain, constipation, diarrhea, nausea and vomiting.  Genitourinary: Positive for missed menses and vaginal discharge. Negative for pelvic pain.  Musculoskeletal: Negative for back pain.  Neurological: Negative for headaches.   Other systems negative  Physical Exam  Patient Vitals for the past 24 hrs:  BP Temp Pulse Resp Height Weight  01/25/17 2308 106/63 98.4 F (36.9 C) 115 18 5\' 2"  (1.575 m) 130 lb 1.9 oz (59 kg)   Constitutional: Well-developed,  well-nourished female in no acute distress.  Cardiovascular: normal rate and rhythm Respiratory: normal effort, clear to auscultation bilaterally GI: Abd soft, non-tender, gravid appropriate for gestational age.   No rebound or guarding. MS: Extremities nontender, no edema, normal ROM Neurologic: Alert and oriented x 4.  GU: Neg CVAT.  PELVIC EXAM: Cervix pink, visually closed, without lesion, scant white creamy discharge, vaginal walls and external genitalia normal Bimanual exam: Cervix firm, posterior, neg CMT, uterus nontender, Fundal Height consistent with dates, adnexa without tenderness, enlargement, or mass No pooling and no ferning   FHT:  Baseline 140 , moderate variability, accelerations present, no decelerations Contractions: q 5-6 mins   Labs: No results found for this or any previous visit (from the past 24 hour(s)).     Imaging:  No results found.  MAU Course/MDM: I have ordered labs and reviewed results.  NST reviewed   I noted her to be having contractions after I left the room.  She now states she does feel contractions that hurt.  Consult Dr Renaldo Fiddler with presentation, exam findings and test results.  Treatments in MAU included Procardia and Betamethasone.  [name] 0048 hrs:  First dose Procardia                      UCs nearly stopped then restarted frequently   0052 hrs:  Betamethasone   0256 hrs:  Second dose of Procardia UCs diminished after second dose to only 2 per hour  Assessment: SIUP at [redacted]w[redacted]d Preterm uterine contractions  Plan: Discharge home Preterm Labor precautions and fetal kick counts Follow up in Office for prenatal visits and recheck of cervix Return to MAU tomorrow for second dose of Betamethasone  Encouraged to return here or to other Urgent Care/ED if she develops worsening of symptoms, increase in pain, fever, or other concerning symptoms.   Pt stable at time of discharge.  Wynelle Bourgeois CNM, MSN Certified Nurse-Midwife 01/25/2017 11:37  PM

## 2017-01-25 NOTE — MAU Note (Signed)
I was laying down tonight and leaked small amt clear fld. I have leaked 3-4 times. First time was about 2000. No pain

## 2017-01-26 ENCOUNTER — Inpatient Hospital Stay (EMERGENCY_DEPARTMENT_HOSPITAL)
Admission: AD | Admit: 2017-01-26 | Discharge: 2017-01-27 | Disposition: A | Discharge status: Home / Self Care | Payer: Commercial Managed Care - HMO | Source: Ambulatory Visit | Attending: Obstetrics and Gynecology | Admitting: Obstetrics and Gynecology

## 2017-01-26 ENCOUNTER — Encounter (HOSPITAL_COMMUNITY): Payer: Self-pay | Admitting: *Deleted

## 2017-01-26 DIAGNOSIS — O26893 Other specified pregnancy related conditions, third trimester: Secondary | ICD-10-CM

## 2017-01-26 DIAGNOSIS — N898 Other specified noninflammatory disorders of vagina: Secondary | ICD-10-CM

## 2017-01-26 DIAGNOSIS — Z3A34 34 weeks gestation of pregnancy: Secondary | ICD-10-CM

## 2017-01-26 DIAGNOSIS — O4703 False labor before 37 completed weeks of gestation, third trimester: Secondary | ICD-10-CM | POA: Diagnosis not present

## 2017-01-26 MED ORDER — NIFEDIPINE 10 MG PO CAPS
10.0000 mg | ORAL_CAPSULE | Freq: Once | ORAL | Status: AC
  Administered 2017-01-26: 10 mg via ORAL
  Filled 2017-01-26: qty 1

## 2017-01-26 MED ORDER — BETAMETHASONE SOD PHOS & ACET 6 (3-3) MG/ML IJ SUSP
12.0000 mg | Freq: Once | INTRAMUSCULAR | Status: AC
  Administered 2017-01-26: 12 mg via INTRAMUSCULAR
  Filled 2017-01-26: qty 2

## 2017-01-26 NOTE — Discharge Instructions (Signed)
Preterm Labor and Birth Information °Pregnancy normally lasts 39-41 weeks. Preterm labor is when labor starts early. It starts before you have been pregnant for 37 whole weeks. °What are the risk factors for preterm labor? °Preterm labor is more likely to occur in women who: °· Have an infection while pregnant. °· Have a cervix that is short. °· Have gone into preterm labor before. °· Have had surgery on their cervix. °· Are younger than age 20. °· Are older than age 35. °· Are African American. °· Are pregnant with two or more babies. °· Take street drugs while pregnant. °· Smoke while pregnant. °· Do not gain enough weight while pregnant. °· Got pregnant right after another pregnancy. °What are the symptoms of preterm labor? °Symptoms of preterm labor include: °· Cramps. The cramps may feel like the cramps some women get during their period. The cramps may happen with watery poop (diarrhea). °· Pain in the belly (abdomen). °· Pain in the lower back. °· Regular contractions or tightening. It may feel like your belly is getting tighter. °· Pressure in the lower belly that seems to get stronger. °· More fluid (discharge) leaking from the vagina. The fluid may be watery or bloody. °· Water breaking. °Why is it important to notice signs of preterm labor? °Babies who are born early may not be fully developed. They have a higher chance for: °· Long-term heart problems. °· Long-term lung problems. °· Trouble controlling body systems, like breathing. °· Bleeding in the brain. °· A condition called cerebral palsy. °· Learning difficulties. °· Death. °These risks are highest for babies who are born before 34 weeks of pregnancy. °How is preterm labor treated? °Treatment depends on: °· How long you were pregnant. °· Your condition. °· The health of your baby. °Treatment may involve: °· Having a stitch (suture) placed in your cervix. When you give birth, your cervix opens so the baby can come out. The stitch keeps the cervix  from opening too soon. °· Staying at the hospital. °· Taking or getting medicines, such as: °¨ Hormone medicines. °¨ Medicines to stop contractions. °¨ Medicines to help the baby’s lungs develop. °¨ Medicines to prevent your baby from having cerebral palsy. °What should I do if I am in preterm labor? °If you think you are going into labor too soon, call your doctor right away. °How can I prevent preterm labor? °· Do not use any tobacco products. °¨ Examples of these are cigarettes, chewing tobacco, and e-cigarettes. °¨ If you need help quitting, ask your doctor. °· Do not use street drugs. °· Do not use any medicines unless you ask your doctor if they are safe for you. °· Talk with your doctor before taking any herbal supplements. °· Make sure you gain enough weight. °· Watch for infection. If you think you might have an infection, get it checked right away. °· If you have gone into preterm labor before, tell your doctor. °This information is not intended to replace advice given to you by your health care provider. Make sure you discuss any questions you have with your health care provider. °Document Released: 03/08/2009 Document Revised: 05/22/2016 Document Reviewed: 05/02/2016 °Elsevier Interactive Patient Education © 2017 Elsevier Inc. ° °

## 2017-01-26 NOTE — Progress Notes (Signed)
Venia CarbonJennifer Rasch NP on unit and aware of pt's admission and status. Will do amnisure

## 2017-01-26 NOTE — MAU Note (Signed)
Here for second BMZ inj. No problems. Occ ctx. Did leak clear fld x1 today

## 2017-01-26 NOTE — Progress Notes (Signed)
efm d/ced 

## 2017-01-27 ENCOUNTER — Observation Stay (HOSPITAL_COMMUNITY)
Admission: AD | Admit: 2017-01-27 | Discharge: 2017-01-28 | Disposition: A | Discharge status: Home / Self Care | Payer: Commercial Managed Care - HMO | Source: Ambulatory Visit | Attending: Obstetrics & Gynecology | Admitting: Obstetrics & Gynecology

## 2017-01-27 ENCOUNTER — Encounter (HOSPITAL_COMMUNITY): Payer: Self-pay | Admitting: *Deleted

## 2017-01-27 DIAGNOSIS — Z3A34 34 weeks gestation of pregnancy: Secondary | ICD-10-CM

## 2017-01-27 DIAGNOSIS — O4703 False labor before 37 completed weeks of gestation, third trimester: Principal | ICD-10-CM | POA: Insufficient documentation

## 2017-01-27 DIAGNOSIS — O4700 False labor before 37 completed weeks of gestation, unspecified trimester: Secondary | ICD-10-CM | POA: Diagnosis present

## 2017-01-27 DIAGNOSIS — O479 False labor, unspecified: Secondary | ICD-10-CM

## 2017-01-27 LAB — URINALYSIS, ROUTINE W REFLEX MICROSCOPIC
BILIRUBIN URINE: NEGATIVE
Glucose, UA: 50 mg/dL — AB
HGB URINE DIPSTICK: NEGATIVE
KETONES UR: 20 mg/dL — AB
NITRITE: NEGATIVE
PROTEIN: NEGATIVE mg/dL
SPECIFIC GRAVITY, URINE: 1.011 (ref 1.005–1.030)
pH: 6 (ref 5.0–8.0)

## 2017-01-27 LAB — TYPE AND SCREEN
ABO/RH(D): B POS
Antibody Screen: NEGATIVE

## 2017-01-27 LAB — RAPID URINE DRUG SCREEN, HOSP PERFORMED
Amphetamines: NOT DETECTED
BARBITURATES: NOT DETECTED
Benzodiazepines: NOT DETECTED
COCAINE: NOT DETECTED
Opiates: NOT DETECTED
Tetrahydrocannabinol: NOT DETECTED

## 2017-01-27 LAB — CBC
HEMATOCRIT: 28.4 % — AB (ref 36.0–46.0)
Hemoglobin: 10 g/dL — ABNORMAL LOW (ref 12.0–15.0)
MCH: 26.8 pg (ref 26.0–34.0)
MCHC: 35.2 g/dL (ref 30.0–36.0)
MCV: 76.1 fL — AB (ref 78.0–100.0)
PLATELETS: 149 10*3/uL — AB (ref 150–400)
RBC: 3.73 MIL/uL — ABNORMAL LOW (ref 3.87–5.11)
RDW: 14 % (ref 11.5–15.5)
WBC: 13.9 10*3/uL — ABNORMAL HIGH (ref 4.0–10.5)

## 2017-01-27 LAB — AMNISURE RUPTURE OF MEMBRANE (ROM) NOT AT ARMC: Amnisure ROM: NEGATIVE

## 2017-01-27 MED ORDER — NIFEDIPINE 10 MG PO CAPS
10.0000 mg | ORAL_CAPSULE | Freq: Once | ORAL | Status: AC
Start: 2017-01-27 — End: 2017-01-27
  Administered 2017-01-27: 10 mg via ORAL
  Filled 2017-01-27: qty 1

## 2017-01-27 MED ORDER — PRENATAL MULTIVITAMIN CH
1.0000 | ORAL_TABLET | Freq: Every day | ORAL | Status: DC
  Administered 2017-01-28: 1 via ORAL
  Filled 2017-01-27: qty 1

## 2017-01-27 MED ORDER — NIFEDIPINE 10 MG PO CAPS
10.0000 mg | ORAL_CAPSULE | Freq: Four times a day (QID) | ORAL | Status: DC
  Administered 2017-01-27 – 2017-01-28 (×3): 10 mg via ORAL
  Filled 2017-01-27 (×3): qty 1

## 2017-01-27 MED ORDER — DEXTROSE IN LACTATED RINGERS 5 % IV SOLN: INTRAVENOUS | Status: DC

## 2017-01-27 MED ORDER — LACTATED RINGERS IV BOLUS (SEPSIS)
500.0000 mL | Freq: Once | INTRAVENOUS | Status: AC
  Administered 2017-01-27 (×2): 1000 mL via INTRAVENOUS

## 2017-01-27 MED ORDER — LACTATED RINGERS IV SOLN
INTRAVENOUS | Status: DC
  Administered 2017-01-27: 22:00:00 via INTRAVENOUS

## 2017-01-27 MED ORDER — ACETAMINOPHEN 325 MG PO TABS
650.0000 mg | ORAL_TABLET | ORAL | Status: DC | PRN
  Administered 2017-01-28: 650 mg via ORAL
  Filled 2017-01-27: qty 2

## 2017-01-27 MED ORDER — DOCUSATE SODIUM 100 MG PO CAPS
100.0000 mg | ORAL_CAPSULE | Freq: Every day | ORAL | Status: DC
  Administered 2017-01-27 – 2017-01-28 (×2): 100 mg via ORAL
  Filled 2017-01-27 (×2): qty 1

## 2017-01-27 MED ORDER — CALCIUM CARBONATE ANTACID 500 MG PO CHEW: 2.0000 | CHEWABLE_TABLET | ORAL | Status: DC | PRN

## 2017-01-27 NOTE — MAU Provider Note (Signed)
S:  Kari [name] is a 21 y.o. female 343P0110 @ 2375w0d here in MAU for her 2nd dose of betamethasone. She was seen yesterday in the MAU with complaints of ?leaking and contractions. She had one episode of leaking today. No continued leaking or gushes.   O:  GENERAL: Well-developed, well-nourished female in no acute distress.  LUNGS: Effort normal SKIN: Warm, dry and without erythema PSYCH: Normal mood and affect  Vitals:   01/26/17 2328 01/27/17 0033  BP: 111/64 113/62  Pulse: 108 104  Resp: 18 18  Temp: 98.1 F (36.7 C) 98.7 F (37.1 C)    MDM  Amnisure collected: negative. Discussed with Dr. Renaldo FiddlerAdkins. [facility] to [employer].    A:  1. Threatened preterm labor, third trimester   2. Vaginal discharge in third trimester, antepartum     P:  Discharge home in stable condition Betamethasone course complete Follow up with OB as scheduled  Preterm labor precautions Strict return precautions  Duane LopeJennifer I Rasch, NP 01/27/2017 1:08 AM

## 2017-01-27 NOTE — MAU Note (Signed)
Pt has been seen in MAU the past 2 days with preterm contractions, states uc's started again today around 1530, denies bleeding or LOF.

## 2017-01-27 NOTE — H&P (Signed)
Kari [name] is a 21 y.o. female 692P0110 @ 34 wks presenting with c/o preterm contractions.  Pt was evaluated 2d ago with c/o contractions.  Contractions resolved with PO procardia x 2 in MAU and pt d/c home.  Pt given outpatient BMZ x 2 doses 01/25/17-01/26/17.  Denies VB, LOF, n/v.  + FM.  Pt with h/o 30 week IUFD.  Upon presentation tonight, pt was having decels with contractions.  Contractions resolved with procardia and IVF.  Prior to transfer to floor, decels resolved and good variability w/ accels.  Bedside ultrasound by CNM shows vertex presentation and grossly normal amniotic fluid  OB History    Gravida Para Term Preterm AB Living   3 1   1 1  0   SAB TAB Ectopic Multiple Live Births   1     0       Past Medical History:  Diagnosis Date  . Anemia   . Anxiety   . Depression   . Headache    migrains  . IUFD (intrauterine fetal death) 2016   Past Surgical History:  Procedure Laterality Date  . APPENDECTOMY    . HERNIA REPAIR     Family History: family history is not on file. Social History:  reports that she has never smoked. She has never used smokeless tobacco. She reports that she does not drink alcohol or use drugs.     Maternal Diabetes: No Genetic Screening: Declined Maternal Ultrasounds/Referrals: Normal Fetal Ultrasounds or other Referrals:  None Maternal Substance Abuse:   Significant Maternal Medications:  None Significant Maternal Lab Results:  None Other Comments:  None  ROS History Dilation: Closed Effacement (%): Thick Exam by:: Diedre Poe CNM Blood pressure 116/62, pulse 78, temperature 98.7 F (37.1 C), temperature source Oral, resp. rate 16, last menstrual period 06/05/2016, unknown if currently breastfeeding. Exam Physical Exam  Gen - NAD Abd - gravid, NT Ext - NT, no edema Cvx - closed Prenatal labs: ABO, Rh:   Antibody:   Rubella:   RPR: Non Reactive (02/18 1649)  HBsAg:    HIV: Non Reactive (07/26 1316)  GBS:      Assessment/Plan: Admit for obs and fetal monitoring ultrasound for EFW/AFI Procardia Collect GBS UDS    Beverley Allender 01/27/2017, 9:34 PM

## 2017-01-27 NOTE — MAU Provider Note (Signed)
History     CSN: [id number]  Arrival date and time: 01/27/17 7829   First Provider Initiated Contact with Patient 01/27/17 1855      Chief Complaint  Patient presents with  . Contractions   Kari [name] is a 21 y.o. G3P0110 at [redacted]w[redacted]d returns to MAU presenting with contractions. She states she timed contractions at 10 minute intervals starting at 3:30 AM and during the 2 hours PTA interval decreased to about q5 minutes and they became more uncomfortable. Reports good FM. Denies any leakage of fluid or vaginal bleeding today though she gave history of leaking small amounts of clear fluid and was evaluated on 01/25/2017 and 01/26/2017 for possible ROM. Cervix was closed/50% on 01/25/2017. Amnisure was negative last night. She received betamethasone over the last 2 days. She is not on a tocolytic. Reports good fluid intake.    Hx 30 wk IUFD  OB History  Gravida Para Term Preterm AB Living  3 1   1 1  0  SAB TAB Ectopic Multiple Live Births  1     0      # Outcome Date GA Lbr Len/2nd Weight Sex Delivery Anes PTL Lv  3 Current           2 SAB 01/2016          1 Preterm 10/08/15 [redacted]w[redacted]d 03:20 / 00:02 1.385 kg (3 lb 0.9 oz) M Vag-Spont EPI  FD     Birth Comments: none       Past Medical History:  Diagnosis Date  . Anemia   . Anxiety   . Depression   . Headache    migrains  . IUFD (intrauterine fetal death) 06/01/2015    Past Surgical History:  Procedure Laterality Date  . APPENDECTOMY    . HERNIA REPAIR      Family History  Problem Relation Age of Onset  . Alcohol abuse Neg Hx   . Arthritis Neg Hx   . Asthma Neg Hx   . Birth defects Neg Hx   . Cancer Neg Hx   . COPD Neg Hx   . Depression Neg Hx   . Diabetes Neg Hx   . Drug abuse Neg Hx   . Early death Neg Hx   . Hearing loss Neg Hx   . Heart disease Neg Hx   . Hyperlipidemia Neg Hx   . Hypertension Neg Hx   . Kidney disease Neg Hx   . Learning disabilities Neg Hx   . Mental illness Neg Hx   . Mental retardation  Neg Hx   . Miscarriages / Stillbirths Neg Hx   . Stroke Neg Hx   . Vision loss Neg Hx   . Varicose Veins Neg Hx     Social History  Substance Use Topics  . Smoking status: Never Smoker  . Smokeless tobacco: Never Used  . Alcohol use No    Allergies: No Known Allergies  Prescriptions Prior to Admission  Medication Sig Dispense Refill Last Dose  . Prenat w/o A Vit-FeFum-FePo-FA (CONCEPT OB) 130-92.4-1 MG CAPS Take 1 tablet by mouth daily. 30 capsule 12 01/25/2017 at Unknown time    Review of Systems  Constitutional: Negative for fever.  Gastrointestinal: Positive for abdominal pain. Negative for nausea.  Genitourinary: Negative for dysuria, flank pain, hematuria, vaginal bleeding and vaginal discharge.  Musculoskeletal: Negative for back pain.   Physical Exam   Blood pressure 116/62, pulse 78, temperature 98.7 F (37.1 C), temperature source Oral, resp. rate 16,  last menstrual period 06/05/2016, unknown if currently breastfeeding.  Physical Exam  Nursing note and vitals reviewed. Constitutional: She is oriented to person, place, and time. She appears well-developed and well-nourished. No distress.  Appears uncomfortable with UCs  HENT:  Head: Normocephalic.  Eyes: Pupils are equal, round, and reactive to light.  Neck: Neck supple.  Cardiovascular: Normal rate.   Respiratory: Effort normal.  GI: Soft. There is no tenderness.  Size c/w 34 wks  Genitourinary:  Genitourinary Comments: SVE: posterior soft, long/closed/-3 cephalic  Musculoskeletal: Normal range of motion.  Neurological: She is alert and oriented to person, place, and time.  Skin: Skin is warm and dry.  Psychiatric: She has a normal mood and affect. Thought content normal.    MAU Course  Procedures Results for orders placed or performed during the hospital encounter of 01/27/17 (from the past 24 hour(s))  Urinalysis, Routine w reflex microscopic     Status: Abnormal   Collection Time: 01/27/17  6:30 PM   Result Value Ref Range   Color, Urine YELLOW YELLOW   APPearance HAZY (A) CLEAR   Specific Gravity, Urine 1.011 1.005 - 1.030   pH 6.0 5.0 - 8.0   Glucose, UA 50 (A) NEGATIVE mg/dL   Hgb urine dipstick NEGATIVE NEGATIVE   Bilirubin Urine NEGATIVE NEGATIVE   Ketones, ur 20 (A) NEGATIVE mg/dL   Protein, ur NEGATIVE NEGATIVE mg/dL   Nitrite NEGATIVE NEGATIVE   Leukocytes, UA TRACE (A) NEGATIVE   RBC / HPF 0-5 0 - 5 RBC/hpf   WBC, UA 0-5 0 - 5 WBC/hpf   Bacteria, UA RARE (A) NONE SEEN   Squamous Epithelial / LPF 0-5 (A) NONE SEEN  EFM Fetal heart rate baseline 140, moderate variability, reactive, repetitive late onset decelerations with retained variability and no reflex tachycardia are noted Brief bedside US by me: normal AFV Procardia ordered  MDM C/W Dr. Renaldo FiddlerAdkins due to persistent decelerations after IVF bolus.   Assessment and Plan  21 yo G3P0110  Preterm contractions with FHR decelerations Poor OB hx  Dr. Renaldo FiddlerAdkins to come see pt Nguyen Butler CNM 01/27/2017, 6:55 PM

## 2017-01-27 NOTE — Progress Notes (Signed)
D/C instructions given by Steward DroneVeronica Rogers RNC

## 2017-01-27 NOTE — Discharge Instructions (Signed)
Braxton Hicks Contractions °Contractions of the uterus can occur throughout pregnancy. Contractions are not always a sign that you are in labor.  °WHAT ARE BRAXTON HICKS CONTRACTIONS?  °Contractions that occur before labor are called Braxton Hicks contractions, or false labor. Toward the end of pregnancy (32-34 weeks), these contractions can develop more often and may become more forceful. This is not true labor because these contractions do not result in opening (dilatation) and thinning of the cervix. They are sometimes difficult to tell apart from true labor because these contractions can be forceful and people have different pain tolerances. You should not feel embarrassed if you go to the hospital with false labor. Sometimes, the only way to tell if you are in true labor is for your health care provider to look for changes in the cervix. °If there are no prenatal problems or other health problems associated with the pregnancy, it is completely safe to be sent home with false labor and await the onset of true labor. °HOW CAN YOU TELL THE DIFFERENCE BETWEEN TRUE AND FALSE LABOR? °False Labor  °· The contractions of false labor are usually shorter and not as hard as those of true labor.   °· The contractions are usually irregular.   °· The contractions are often felt in the front of the lower abdomen and in the groin.   °· The contractions may go away when you walk around or change positions while lying down.   °· The contractions get weaker and are shorter lasting as time goes on.   °· The contractions do not usually become progressively stronger, regular, and closer together as with true labor.   °True Labor  °· Contractions in true labor last 30-70 seconds, become very regular, usually become more intense, and increase in frequency.   °· The contractions do not go away with walking.   °· The discomfort is usually felt in the top of the uterus and spreads to the lower abdomen and low back.   °· True labor can be  determined by your health care provider with an exam. This will show that the cervix is dilating and getting thinner.   °WHAT TO REMEMBER °· Keep up with your usual exercises and follow other instructions given by your health care provider.   °· Take medicines as directed by your health care provider.   °· Keep your regular prenatal appointments.   °· Eat and drink lightly if you think you are going into labor.   °· If Braxton Hicks contractions are making you uncomfortable:   °¨ Change your position from lying down or resting to walking, or from walking to resting.   °¨ Sit and rest in a tub of warm water.   °¨ Drink 2-3 glasses of water. Dehydration may cause these contractions.   °¨ Do slow and deep breathing several times an hour.   °WHEN SHOULD I SEEK IMMEDIATE MEDICAL CARE? °Seek immediate medical care if: °· Your contractions become stronger, more regular, and closer together.   °· You have fluid leaking or gushing from your vagina.   °· You have a fever.   °· You pass blood-tinged mucus.   °· You have vaginal bleeding.   °· You have continuous abdominal pain.   °· You have low back pain that you never had before.   °· You feel your baby's head pushing down and causing pelvic pressure.   °· Your baby is not moving as much as it used to.   °This information is not intended to replace advice given to you by your health care provider. Make sure you discuss any questions you have with your health care   provider. °Document Released: 12/10/2005 Document Revised: 04/02/2016 Document Reviewed: 09/21/2013 °Elsevier Interactive Patient Education © 2017 Elsevier Inc[employer] ° °

## 2017-01-28 ENCOUNTER — Observation Stay (HOSPITAL_COMMUNITY): Payer: Commercial Managed Care - HMO

## 2017-01-28 ENCOUNTER — Encounter (HOSPITAL_COMMUNITY): Payer: Self-pay | Admitting: Radiology

## 2017-01-28 DIAGNOSIS — O479 False labor, unspecified: Secondary | ICD-10-CM | POA: Diagnosis present

## 2017-01-28 DIAGNOSIS — O4700 False labor before 37 completed weeks of gestation, unspecified trimester: Secondary | ICD-10-CM | POA: Diagnosis present

## 2017-01-28 MED ORDER — NIFEDIPINE 10 MG PO CAPS: 10.0000 mg | ORAL_CAPSULE | Freq: Four times a day (QID) | ORAL | 0 refills | Status: DC

## 2017-01-28 NOTE — Discharge Instructions (Signed)
Call MD for decreased fetal movement, 6 or more contractions/hour, or vaginal bleeding.  Follow up at office visit tomorrow for NST.

## 2017-01-28 NOTE — Progress Notes (Signed)
Not feeling CTX anymore Active FM U/S shows normal AFI and appropriate growth (5#13) Fetal monitoring continues to be Cat I Patient has appt in office tomorrow. Will d/c home with PTL precautions and fetal kick counts Rx procardia   Mitchel HonourMegan Temekia Caskey, DO

## 2017-01-28 NOTE — Progress Notes (Signed)
No current c/o.  Sleeping.    VSS. AF.  Toco flat FHT Cat I  Gen: resting comfortably Abd: soft, NT Ext: no c/c/e  21yo G2P0110 at 6w1dwith PTC, non-reassuring fetal tracing  -Fetal monitoring has been Cat I since arriving to ante -U/S pending -Possible d/c home if u/s reassuring  MLinda Hedges DO

## 2017-01-29 NOTE — Discharge Summary (Signed)
Obstetric Discharge Summary Reason for Admission: observation/evaluation (preterm CTX, fetal decelerations on monitoring) Prenatal Procedures: NST and ultrasound Intrapartum Procedures: n/a Postpartum Procedures: n/a Complications-Operative and Postpartum: n/a Hemoglobin  Date Value Ref Range Status  01/27/2017 10.0 (L) 12.0 - 15.0 g/dL Final   HCT  Date Value Ref Range Status  01/27/2017 28.4 (L) 36.0 - 46.0 % Final    Physical Exam:  General: alert, cooperative and appears stated age 108Lochia: n/a Uterine Fundus: soft Incision: n/a DVT Evaluation: No evidence of DVT seen on physical exam. Negative Homan's sign. No cords or calf tenderness.  Discharge Diagnoses: False labor-undelivered  Discharge Information: Date: 01/29/2017 Activity: unrestricted Diet: routine Medications: PNV Condition: stable Instructions: refer to practice specific booklet Discharge to: home   Newborn Data: This patient has no babies on file. Home with mother.  Artemis Koller 01/29/2017, 6:41 AM

## 2017-02-19 LAB — OB RESULTS CONSOLE GBS: GBS: POSITIVE

## 2017-02-20 ENCOUNTER — Inpatient Hospital Stay (HOSPITAL_COMMUNITY)
Admission: AD | Admit: 2017-02-20 | Discharge: 2017-02-20 | Disposition: A | Discharge status: Home / Self Care | Payer: Commercial Managed Care - HMO | Source: Ambulatory Visit | Attending: Obstetrics and Gynecology | Admitting: Obstetrics and Gynecology

## 2017-02-20 ENCOUNTER — Encounter (HOSPITAL_COMMUNITY): Payer: Self-pay | Admitting: *Deleted

## 2017-02-20 DIAGNOSIS — O471 False labor at or after 37 completed weeks of gestation: Secondary | ICD-10-CM | POA: Insufficient documentation

## 2017-02-20 DIAGNOSIS — Z3A37 37 weeks gestation of pregnancy: Secondary | ICD-10-CM | POA: Insufficient documentation

## 2017-02-20 DIAGNOSIS — O479 False labor, unspecified: Secondary | ICD-10-CM

## 2017-02-20 NOTE — MAU Note (Signed)
PT  SAYS  SHE HAS   UC   -  STRONG -   . VE IN  OFFICE -   3  CM.     DENIES HSV AND MRSA.      GBS-  COLLECTED LAST WEEK.

## 2017-02-20 NOTE — Discharge Instructions (Signed)
Fetal Movement Counts Patient Name: ________________________________________________ Patient Due Date: ____________________ What is a fetal movement count? A fetal movement count is the number of times that you feel your baby move during a certain amount of time. This may also be called a fetal kick count. A fetal movement count is recommended for every pregnant woman. You may be asked to start counting fetal movements as early as week 28 of your pregnancy. Pay attention to when your baby is most active. You may notice your baby's sleep and wake cycles. You may also notice things that make your baby move more. You should do a fetal movement count:  When your baby is normally most active.  At the same time each day. A good time to count movements is while you are resting, after having something to eat and drink. How do I count fetal movements? 1. Find a quiet, comfortable area. Sit, or lie down on your side. 2. Write down the date, the start time and stop time, and the number of movements that you felt between those two times. Take this information with you to your health care visits. 3. For 2 hours, count kicks, flutters, swishes, rolls, and jabs. You should feel at least 10 movements during 2 hours. 4. You may stop counting after you have felt 10 movements. 5. If you do not feel 10 movements in 2 hours, have something to eat and drink. Then, keep resting and counting for 1 hour. If you feel at least 4 movements during that hour, you may stop counting. Contact a health care provider if:  You feel fewer than 4 movements in 2 hours.  Your baby is not moving like he or she usually does. Date: ____________ Start time: ____________ Stop time: ____________ Movements: ____________ Date: ____________ Start time: ____________ Stop time: ____________ Movements: ____________ Date: ____________ Start time: ____________ Stop time: ____________ Movements: ____________ Date: ____________ Start time:  ____________ Stop time: ____________ Movements: ____________ Date: ____________ Start time: ____________ Stop time: ____________ Movements: ____________ Date: ____________ Start time: ____________ Stop time: ____________ Movements: ____________ Date: ____________ Start time: ____________ Stop time: ____________ Movements: ____________ Date: ____________ Start time: ____________ Stop time: ____________ Movements: ____________ Date: ____________ Start time: ____________ Stop time: ____________ Movements: ____________ This information is not intended to replace advice given to you by your health care provider. Make sure you discuss any questions you have with your health care provider. Document Released: 01/09/2007 Document Revised: 08/08/2016 Document Reviewed: 01/19/2016 Elsevier Interactive Patient Education  2017 Else[employer] Inc[employer] Braxton Hicks Contractions Contractions of the uterus can occur throughout pregnancy, but they are not always a sign that you are in labor. You may have practice contractions called Braxton Hicks contractions. These false labor contractions are sometimes confused with true labor. What are Braxton Hicks contractions? Braxton Hicks contractions are tightening movements that occur in the muscles of the uterus before labor. Unlike true labor contractions, these contractions do not result in opening (dilation) and thinning of the cervix. Toward the end of pregnancy (32-34 weeks), Braxton Hicks contractions can happen more often and may become stronger. These contractions are sometimes difficult to tell apart from true labor because they can be very uncomfortable. You should not feel embarrassed if you go to the hospital with false labor. Sometimes, the only way to tell if you are in true labor is for your health care provider to look for changes in the cervix. The health care provider will do a physical exam and may monitor your contractions. If you   are not in true labor, the exam  should show that your cervix is not dilating and your water has not broken. If there are no prenatal problems or other health problems associated with your pregnancy, it is completely safe for you to be sent home with false labor. You may continue to have Braxton Hicks contractions until you go into true labor. How can I tell the difference between true labor and false labor?  Differences  False labor  Contractions last 30-70 seconds.: Contractions are usually shorter and not as strong as true labor contractions.  Contractions become very regular.: Contractions are usually irregular.  Discomfort is usually felt in the top of the uterus, and it spreads to the lower abdomen and low back.: Contractions are often felt in the front of the lower abdomen and in the groin.  Contractions do not go away with walking.: Contractions may go away when you walk around or change positions while lying down.  Contractions usually become more intense and increase in frequency.: Contractions get weaker and are shorter-lasting as time goes on.  The cervix dilates and gets thinner.: The cervix usually does not dilate or become thin. Follow these instructions at home:  Take over-the-counter and prescription medicines only as told by your health care provider.  Keep up with your usual exercises and follow other instructions from your health care provider.  Eat and drink lightly if you think you are going into labor.  If Braxton Hicks contractions are making you uncomfortable:  Change your position from lying down or resting to walking, or change from walking to resting.  Sit and rest in a tub of warm water.  Drink enough fluid to keep your urine clear or pale yellow. Dehydration may cause these contractions.  Do slow and deep breathing several times an hour.  Keep all follow-up prenatal visits as told by your health care provider. This is important. Contact a health care provider if:  You have a  fever.  You have continuous pain in your abdomen. Get help right away if:  Your contractions become stronger, more regular, and closer together.  You have fluid leaking or gushing from your vagina.  You pass blood-tinged mucus (bloody show).  You have bleeding from your vagina.  You have low back pain that you never had before.  You feel your baby's head pushing down and causing pelvic pressure.  Your baby is not moving inside you as much as it used to. Summary  Contractions that occur before labor are called Braxton Hicks contractions, false labor, or practice contractions.  Braxton Hicks contractions are usually shorter, weaker, farther apart, and less regular than true labor contractions. True labor contractions usually become progressively stronger and regular and they become more frequent.  Manage discomfort from Braxton Hicks contractions by changing position, resting in a warm bath, drinking plenty of water, or practicing deep breathing. This information is not intended to replace advice given to you by your health care provider. Make sure you discuss any questions you have with your health care provider. Document Released: 12/10/2005 Document Revised: 10/29/2016 Document Reviewed: 10/29/2016 Elsevier Interactive Patient Education  2017 Elsevier Inc.  

## 2017-02-20 NOTE — MAU Note (Signed)
I have communicated with Dr. Renaldo FiddlerAdkins and reviewed vital signs:  Vitals:   02/20/17 0040 02/20/17 0214  BP: 109/64 111/65  Pulse: 99 82  Resp: 18 18  Temp: 98.5 F (36.9 C)     Vaginal exam:  Dilation: 2 Effacement (%): 60 Cervical Position: Posterior Station: -2 Presentation: Vertex Exam by:: Caprice Renshawebra Callaway RN ,   Also reviewed contraction pattern and that non-stress test is reactive.  It has been documented that patient is contracting every 5-6 minutes with no cervical change over 1 hour not indicating active labor.  Patient denies any other complaints.  Based on this report provider has given order for discharge.  A discharge diagnosis will be entered by a provider.  Labor discharge instructions reviewed with patient.

## 2017-02-22 ENCOUNTER — Inpatient Hospital Stay (HOSPITAL_COMMUNITY)
Admission: AD | Admit: 2017-02-22 | Discharge: 2017-02-22 | Disposition: A | Discharge status: Home / Self Care | Payer: Commercial Managed Care - HMO | Source: Ambulatory Visit | Attending: Obstetrics & Gynecology | Admitting: Obstetrics & Gynecology

## 2017-02-22 ENCOUNTER — Encounter (HOSPITAL_COMMUNITY): Payer: Self-pay | Admitting: *Deleted

## 2017-02-22 DIAGNOSIS — Z3A37 37 weeks gestation of pregnancy: Secondary | ICD-10-CM | POA: Diagnosis not present

## 2017-02-22 DIAGNOSIS — G43909 Migraine, unspecified, not intractable, without status migrainosus: Secondary | ICD-10-CM | POA: Insufficient documentation

## 2017-02-22 DIAGNOSIS — F419 Anxiety disorder, unspecified: Secondary | ICD-10-CM | POA: Diagnosis not present

## 2017-02-22 DIAGNOSIS — O99013 Anemia complicating pregnancy, third trimester: Secondary | ICD-10-CM | POA: Insufficient documentation

## 2017-02-22 DIAGNOSIS — O479 False labor, unspecified: Secondary | ICD-10-CM

## 2017-02-22 DIAGNOSIS — N898 Other specified noninflammatory disorders of vagina: Secondary | ICD-10-CM | POA: Insufficient documentation

## 2017-02-22 DIAGNOSIS — O26893 Other specified pregnancy related conditions, third trimester: Secondary | ICD-10-CM | POA: Diagnosis not present

## 2017-02-22 DIAGNOSIS — O471 False labor at or after 37 completed weeks of gestation: Secondary | ICD-10-CM | POA: Diagnosis not present

## 2017-02-22 DIAGNOSIS — O99343 Other mental disorders complicating pregnancy, third trimester: Secondary | ICD-10-CM | POA: Diagnosis not present

## 2017-02-22 DIAGNOSIS — F329 Major depressive disorder, single episode, unspecified: Secondary | ICD-10-CM | POA: Insufficient documentation

## 2017-02-22 NOTE — MAU Provider Note (Signed)
[name] is a 21 y.o. G3P0110 at 7585w5d who presents to MAU today complaining of LOF since 2200 tonight. She denies vaginal bleeding or complications with the pregnancy. She reports good fetal movement. She states regular contractions now q 10 minutes, earlier they were "back to back."    Past Medical History:  Diagnosis Date  . Anemia   . Anxiety   . Depression   . Headache    migrains  . IUFD (intrauterine fetal death) 2016    BP 109/74 (BP Location: Left Arm)   Pulse 108   Temp 98.7 F (37.1 C) (Oral)   Resp 17   LMP 06/05/2016   SpO2 100%  GENERAL: Well-developed, well-nourished female in no acute distress.  HEAD: Normocephalic, atraumatic.  CHEST: Normal effort of breathing, regular heart rate ABDOMEN: Soft, nontender, nondistended.  PELVIC: Normal external female genitalia. Vagina is pink and rugated.  Normal discharge.  Negative pooling. Gravid uterus.   EXTREMITIES: No cyanosis, clubbing, or edema  Cervical Exam:  Dilation: 3 Effacement (%): 50 Cervical Position: Posterior Station: -2 Presentation: Vertex Exam by:: Corliss ParishWeston,RN  Fern - Negative. Sperm present  Fetal Monitoring:  Baseline: 140 bpm, moderate variability, 15 x 15 accelerations, no declerations  Contractions: q 3-4 minutes   MDM Discussed patient with Dr. Langston MaskerMorris. Agrees with plan to discharge at this time with labor precautions.   A: SIUP at 2185w5d Vaginal discharge in pregnancy, third trimester False labor   P: Discharge home Labor precautions discussed Patient advised to follow-up with [facility] as scheduled or sooner if symptoms worsen Patient may return to MAU as needed or if her condition were to change or worsen   Marny LowensteinJulie N Debbra Digiulio, PA-C 02/22/2017 10:55 PM

## 2017-02-22 NOTE — MAU Note (Signed)
Pt reports leaking fluid since 2200, having contractions.

## 2017-02-22 NOTE — Discharge Instructions (Signed)
Braxton Hicks Contractions °Contractions of the uterus can occur throughout pregnancy, but they are not always a sign that you are in labor. You may have practice contractions called Braxton Hicks contractions. These false labor contractions are sometimes confused with true labor. °What are Braxton Hicks contractions? °Braxton Hicks contractions are tightening movements that occur in the muscles of the uterus before labor. Unlike true labor contractions, these contractions do not result in opening (dilation) and thinning of the cervix. Toward the end of pregnancy (32-34 weeks), Braxton Hicks contractions can happen more often and may become stronger. These contractions are sometimes difficult to tell apart from true labor because they can be very uncomfortable. You should not feel embarrassed if you go to the hospital with false labor. °Sometimes, the only way to tell if you are in true labor is for your health care provider to look for changes in the cervix. The health care provider will do a physical exam and may monitor your contractions. If you are not in true labor, the exam should show that your cervix is not dilating and your water has not broken. °If there are no prenatal problems or other health problems associated with your pregnancy, it is completely safe for you to be sent home with false labor. You may continue to have Braxton Hicks contractions until you go into true labor. °How can I tell the difference between true labor and false labor? °· Differences °¨ False labor °¨ Contractions last 30-70 seconds.: Contractions are usually shorter and not as strong as true labor contractions. °¨ Contractions become very regular.: Contractions are usually irregular. °¨ Discomfort is usually felt in the top of the uterus, and it spreads to the lower abdomen and low back.: Contractions are often felt in the front of the lower abdomen and in the groin. °¨ Contractions do not go away with walking.: Contractions may  go away when you walk around or change positions while lying down. °¨ Contractions usually become more intense and increase in frequency.: Contractions get weaker and are shorter-lasting as time goes on. °¨ The cervix dilates and gets thinner.: The cervix usually does not dilate or become thin. °Follow these instructions at home: °¨ Take over-the-counter and prescription medicines only as told by your health care provider. °¨ Keep up with your usual exercises and follow other instructions from your health care provider. °¨ Eat and drink lightly if you think you are going into labor. °¨ If Braxton Hicks contractions are making you uncomfortable: °¨ Change your position from lying down or resting to walking, or change from walking to resting. °¨ Sit and rest in a tub of warm water. °¨ Drink enough fluid to keep your urine clear or pale yellow. Dehydration may cause these contractions. °¨ Do slow and deep breathing several times an hour. °¨ Keep all follow-up prenatal visits as told by your health care provider. This is important. °Contact a health care provider if: °¨ You have a fever. °¨ You have continuous pain in your abdomen. °Get help right away if: °¨ Your contractions become stronger, more regular, and closer together. °¨ You have fluid leaking or gushing from your vagina. °¨ You pass blood-tinged mucus (bloody show). °¨ You have bleeding from your vagina. °¨ You have low back pain that you never had before. °¨ You feel your baby’s head pushing down and causing pelvic pressure. °¨ Your baby is not moving inside you as much as it used to. °Summary °¨ Contractions that occur before labor are   called Braxton Hicks contractions, false labor, or practice contractions. °¨ Braxton Hicks contractions are usually shorter, weaker, farther apart, and less regular than true labor contractions. True labor contractions usually become progressively stronger and regular and they become more frequent. °¨ Manage discomfort from  Braxton Hicks contractions by changing position, resting in a warm bath, drinking plenty of water, or practicing deep breathing. °This information is not intended to replace advice given to you by your health care provider. Make sure you discuss any questions you have with your health care provider. °Document Released: 12/10/2005 Document Revised: 10/29/2016 Document Reviewed: 10/29/2016 °Elsevier Interactive Patient Education © 2017 [employer] °Fetal Movement Counts °Patient Name: ________________________________________________ Patient Due Date: ____________________ °What is a fetal movement count? °A fetal movement count is the number of times that you feel your baby move during a certain amount of time. This may also be called a fetal kick count. A fetal movement count is recommended for every pregnant woman. You may be asked to start counting fetal movements as early as week 28 of your pregnancy. °Pay attention to when your baby is most active. You may notice your baby's sleep and wake cycles. You may also notice things that make your baby move more. You should do a fetal movement count: °· When your baby is normally most active. °· At the same time each day. °A good time to count movements is while you are resting, after having something to eat and drink. °How do I count fetal movements? °1. Find a quiet, comfortable area. Sit, or lie down on your side. °2. Write down the date, the start time and stop time, and the number of movements that you felt between those two times. Take this information with you to your health care visits. °3. For 2 hours, count kicks, flutters, swishes, rolls, and jabs. You should feel at least 10 movements during 2 hours. °4. You may stop counting after you have felt 10 movements. °5. If you do not feel 10 movements in 2 hours, have something to eat and drink. Then, keep resting and counting for 1 hour. If you feel at least 4 movements during that hour, you may stop  counting. °Contact a health care provider if: °· You feel fewer than 4 movements in 2 hours. °· Your baby is not moving like he or she usually does. °Date: ____________ Start time: ____________ Stop time: ____________ Movements: ____________ °Date: ____________ Start time: ____________ Stop time: ____________ Movements: ____________ °Date: ____________ Start time: ____________ Stop time: ____________ Movements: ____________ °Date: ____________ Start time: ____________ Stop time: ____________ Movements: ____________ °Date: ____________ Start time: ____________ Stop time: ____________ Movements: ____________ °Date: ____________ Start time: ____________ Stop time: ____________ Movements: ____________ °Date: ____________ Start time: ____________ Stop time: ____________ Movements: ____________ °Date: ____________ Start time: ____________ Stop time: ____________ Movements: ____________ °Date: ____________ Start time: ____________ Stop time: ____________ Movements: ____________ °This information is not intended to replace advice given to you by your health care provider. Make sure you discuss any questions you have with your health care provider. °Document Released: 01/09/2007 Document Revised: 08/08/2016 Document Reviewed: 01/19/2016 °Elsevier Interactive Patient Education © 2017 [employer] ° °

## 2017-02-27 ENCOUNTER — Encounter (HOSPITAL_COMMUNITY): Payer: Self-pay

## 2017-02-27 ENCOUNTER — Inpatient Hospital Stay (HOSPITAL_COMMUNITY): Payer: Commercial Managed Care - HMO | Admitting: Anesthesiology

## 2017-02-27 ENCOUNTER — Inpatient Hospital Stay (HOSPITAL_COMMUNITY)
Admission: AD | Admit: 2017-02-27 | Discharge: 2017-03-01 | DRG: 775 | Disposition: A | Discharge status: Home / Self Care | Payer: Commercial Managed Care - HMO | Source: Ambulatory Visit | Attending: Obstetrics and Gynecology | Admitting: Obstetrics and Gynecology

## 2017-02-27 DIAGNOSIS — Z3A38 38 weeks gestation of pregnancy: Secondary | ICD-10-CM | POA: Diagnosis not present

## 2017-02-27 DIAGNOSIS — O99824 Streptococcus B carrier state complicating childbirth: Secondary | ICD-10-CM | POA: Diagnosis present

## 2017-02-27 DIAGNOSIS — Z3493 Encounter for supervision of normal pregnancy, unspecified, third trimester: Secondary | ICD-10-CM | POA: Diagnosis present

## 2017-02-27 LAB — CBC
HEMATOCRIT: 34.1 % — AB (ref 36.0–46.0)
Hemoglobin: 11.8 g/dL — ABNORMAL LOW (ref 12.0–15.0)
MCH: 26.5 pg (ref 26.0–34.0)
MCHC: 34.6 g/dL (ref 30.0–36.0)
MCV: 76.6 fL — AB (ref 78.0–100.0)
Platelets: 169 10*3/uL (ref 150–400)
RBC: 4.45 MIL/uL (ref 3.87–5.11)
RDW: 13.9 % (ref 11.5–15.5)
WBC: 7.7 10*3/uL (ref 4.0–10.5)

## 2017-02-27 LAB — TYPE AND SCREEN
ABO/RH(D): B POS
Antibody Screen: NEGATIVE

## 2017-02-27 MED ORDER — IBUPROFEN 600 MG PO TABS
600.0000 mg | ORAL_TABLET | Freq: Four times a day (QID) | ORAL | Status: DC
  Administered 2017-02-27 – 2017-03-01 (×9): 600 mg via ORAL
  Filled 2017-02-27 (×9): qty 1

## 2017-02-27 MED ORDER — EPHEDRINE 5 MG/ML INJ
10.0000 mg | INTRAVENOUS | Status: DC | PRN
  Filled 2017-02-27: qty 4

## 2017-02-27 MED ORDER — WITCH HAZEL-GLYCERIN EX PADS
1.0000 "application " | MEDICATED_PAD | CUTANEOUS | Status: DC | PRN
  Administered 2017-02-27: 1 via TOPICAL

## 2017-02-27 MED ORDER — ONDANSETRON HCL 4 MG/2ML IJ SOLN: 4.0000 mg | Freq: Four times a day (QID) | INTRAMUSCULAR | Status: DC | PRN

## 2017-02-27 MED ORDER — PHENYLEPHRINE 40 MCG/ML (10ML) SYRINGE FOR IV PUSH (FOR BLOOD PRESSURE SUPPORT)
80.0000 ug | PREFILLED_SYRINGE | INTRAVENOUS | Status: DC | PRN
  Filled 2017-02-27: qty 5

## 2017-02-27 MED ORDER — SIMETHICONE 80 MG PO CHEW: 80.0000 mg | CHEWABLE_TABLET | ORAL | Status: DC | PRN

## 2017-02-27 MED ORDER — TETANUS-DIPHTH-ACELL PERTUSSIS 5-2.5-18.5 LF-MCG/0.5 IM SUSP: 0.5000 mL | Freq: Once | INTRAMUSCULAR | Status: DC

## 2017-02-27 MED ORDER — ONDANSETRON HCL 4 MG/2ML IJ SOLN: 4.0000 mg | INTRAMUSCULAR | Status: DC | PRN

## 2017-02-27 MED ORDER — ACETAMINOPHEN 325 MG PO TABS: 650.0000 mg | ORAL_TABLET | ORAL | Status: DC | PRN

## 2017-02-27 MED ORDER — OXYTOCIN BOLUS FROM INFUSION: 500.0000 mL | Freq: Once | INTRAVENOUS | Status: DC

## 2017-02-27 MED ORDER — OXYCODONE-ACETAMINOPHEN 5-325 MG PO TABS: 1.0000 | ORAL_TABLET | ORAL | Status: DC | PRN

## 2017-02-27 MED ORDER — OXYCODONE-ACETAMINOPHEN 5-325 MG PO TABS: 2.0000 | ORAL_TABLET | ORAL | Status: DC | PRN

## 2017-02-27 MED ORDER — PRENATAL MULTIVITAMIN CH
1.0000 | ORAL_TABLET | Freq: Every day | ORAL | Status: DC
  Administered 2017-02-28 – 2017-03-01 (×2): 1 via ORAL
  Filled 2017-02-27 (×2): qty 1

## 2017-02-27 MED ORDER — OXYTOCIN 40 UNITS IN LACTATED RINGERS INFUSION - SIMPLE MED
2.5000 [IU]/h | INTRAVENOUS | Status: DC
  Administered 2017-02-27: 2.5 [IU]/h via INTRAVENOUS
  Filled 2017-02-27: qty 1000

## 2017-02-27 MED ORDER — LACTATED RINGERS IV SOLN: 500.0000 mL | INTRAVENOUS | Status: DC | PRN

## 2017-02-27 MED ORDER — FLEET ENEMA 7-19 GM/118ML RE ENEM: 1.0000 | ENEMA | RECTAL | Status: DC | PRN

## 2017-02-27 MED ORDER — LIDOCAINE HCL (PF) 1 % IJ SOLN
30.0000 mL | INTRAMUSCULAR | Status: DC | PRN
  Filled 2017-02-27: qty 30

## 2017-02-27 MED ORDER — SOD CITRATE-CITRIC ACID 500-334 MG/5ML PO SOLN: 30.0000 mL | ORAL | Status: DC | PRN

## 2017-02-27 MED ORDER — BENZOCAINE-MENTHOL 20-0.5 % EX AERO
1.0000 "application " | INHALATION_SPRAY | CUTANEOUS | Status: DC | PRN
  Administered 2017-02-27: 1 via TOPICAL
  Filled 2017-02-27: qty 56

## 2017-02-27 MED ORDER — COCONUT OIL OIL: 1.0000 "application " | TOPICAL_OIL | Status: DC | PRN

## 2017-02-27 MED ORDER — LIDOCAINE HCL (PF) 1 % IJ SOLN
INTRAMUSCULAR | Status: DC | PRN
  Administered 2017-02-27: 4 mL

## 2017-02-27 MED ORDER — PENICILLIN G POT IN DEXTROSE 60000 UNIT/ML IV SOLN
3.0000 10*6.[IU] | INTRAVENOUS | Status: DC
  Administered 2017-02-27: 3 10*6.[IU] via INTRAVENOUS
  Filled 2017-02-27 (×2): qty 50

## 2017-02-27 MED ORDER — ZOLPIDEM TARTRATE 5 MG PO TABS: 5.0000 mg | ORAL_TABLET | Freq: Every evening | ORAL | Status: DC | PRN

## 2017-02-27 MED ORDER — SENNOSIDES-DOCUSATE SODIUM 8.6-50 MG PO TABS
2.0000 | ORAL_TABLET | ORAL | Status: DC
  Administered 2017-02-28 (×2): 2 via ORAL
  Filled 2017-02-27 (×2): qty 2

## 2017-02-27 MED ORDER — PENICILLIN G POTASSIUM 5000000 UNITS IJ SOLR
5.0000 10*6.[IU] | Freq: Once | INTRAVENOUS | Status: AC
  Administered 2017-02-27: 5 10*6.[IU] via INTRAVENOUS
  Filled 2017-02-27: qty 5

## 2017-02-27 MED ORDER — OXYCODONE HCL 5 MG PO TABS: 5.0000 mg | ORAL_TABLET | ORAL | Status: DC | PRN

## 2017-02-27 MED ORDER — DIBUCAINE 1 % RE OINT: 1.0000 "application " | TOPICAL_OINTMENT | RECTAL | Status: DC | PRN

## 2017-02-27 MED ORDER — LACTATED RINGERS IV SOLN
500.0000 mL | Freq: Once | INTRAVENOUS | Status: AC
  Administered 2017-02-27: 500 mL via INTRAVENOUS

## 2017-02-27 MED ORDER — FENTANYL 2.5 MCG/ML BUPIVACAINE 1/10 % EPIDURAL INFUSION (WH - ANES)
14.0000 mL/h | INTRAMUSCULAR | Status: DC | PRN
  Administered 2017-02-27 (×2): 14 mL/h via EPIDURAL
  Filled 2017-02-27: qty 100

## 2017-02-27 MED ORDER — OXYCODONE HCL 5 MG PO TABS: 10.0000 mg | ORAL_TABLET | ORAL | Status: DC | PRN

## 2017-02-27 MED ORDER — DIPHENHYDRAMINE HCL 50 MG/ML IJ SOLN: 12.5000 mg | INTRAMUSCULAR | Status: DC | PRN

## 2017-02-27 MED ORDER — LACTATED RINGERS IV SOLN: 500.0000 mL | Freq: Once | INTRAVENOUS | Status: DC

## 2017-02-27 MED ORDER — ONDANSETRON HCL 4 MG PO TABS: 4.0000 mg | ORAL_TABLET | ORAL | Status: DC | PRN

## 2017-02-27 MED ORDER — LACTATED RINGERS IV SOLN
INTRAVENOUS | Status: DC
  Administered 2017-02-27 (×2): via INTRAVENOUS

## 2017-02-27 MED ORDER — DIPHENHYDRAMINE HCL 25 MG PO CAPS: 25.0000 mg | ORAL_CAPSULE | Freq: Four times a day (QID) | ORAL | Status: DC | PRN

## 2017-02-27 MED ORDER — PHENYLEPHRINE 40 MCG/ML (10ML) SYRINGE FOR IV PUSH (FOR BLOOD PRESSURE SUPPORT)
80.0000 ug | PREFILLED_SYRINGE | INTRAVENOUS | Status: DC | PRN
  Filled 2017-02-27: qty 5
  Filled 2017-02-27: qty 10

## 2017-02-27 MED ORDER — ACETAMINOPHEN 325 MG PO TABS
650.0000 mg | ORAL_TABLET | ORAL | Status: DC | PRN
  Administered 2017-02-28: 650 mg via ORAL
  Filled 2017-02-27: qty 2

## 2017-02-27 NOTE — Anesthesia Postprocedure Evaluation (Signed)
Anesthesia Post Note  Patient: Kari JourneyCheyenne C Dolliver  Procedure(s) Performed: * No procedures listed *  Patient location during evaluation: Mother Baby Anesthesia Type: Epidural Level of consciousness: awake Pain management: pain level controlled Vital Signs Assessment: post-procedure vital signs reviewed and stable Respiratory status: spontaneous breathing Cardiovascular status: stable Postop Assessment: no headache, no backache, epidural receding and patient able to bend at knees        Last Vitals:  Vitals:   02/27/17 1130 02/27/17 1245  BP: 112/63 104/60  Pulse: 71 79  Resp: 18 18  Temp: 36.5 C 36.4 C    Last Pain:  Vitals:   02/27/17 1245  TempSrc: Axillary  PainSc:    Pain Goal: Patients Stated Pain Goal: 2 (02/27/17 0636)               Edison PaceWILKERSON,Khrystal Jeanmarie

## 2017-02-27 NOTE — Lactation Note (Signed)
This note was copied from a baby's chart. Lactation Consultation Note:  Initial visit at 10 hours of age.  Mom reports a few feedings for about 10 minutes.  Baby noted to have paci in mouth, LC educated mom on risks of paci use with establishing breastfeeding.  Mom voiced understanding.  Mom is able to demonstrate hand expression with drops easily expressed.  LC assisted with STS and cross cradle hold.  Mom applied drops of colostrum to baby's lips and baby continued to sleep.  Baby remains STS with mom.   Mom asked about storage of breastmilk, LC answered and references her admission book. Family at bedside supportive.   [facility] resources given and discussed.  Encouraged to feed with early cues on demand.  Early newborn behavior discussed.    Mom to call for assist as needed.    Patient Name: Kari Lattie HawCheyenne Oran QQVZD'GToday's Date: 02/27/2017 Reason for consult: Initial assessment   Maternal Data Has patient been taught Hand Expression?: Yes Does the patient have breastfeeding experience prior to this delivery?: No  Feeding Feeding Type: Breast Fed  LATCH Score/Interventions Latch: Too sleepy or reluctant, no latch achieved, no sucking elicited. Intervention(s): Skin to skin;Teach feeding cues  Audible Swallowing: None Intervention(s): Hand expression;Skin to skin Intervention(s): Skin to skin;Hand expression  Type of Nipple: Everted at rest and after stimulation Intervention(s): Hand pump  Comfort (Breast/Nipple): Soft / non-tender     Hold (Positioning): Assistance needed to correctly position infant at breast and maintain latch. Intervention(s): Breastfeeding basics reviewed;Support Pillows;Position options;Skin to skin  LATCH Score: 5  Lactation Tools Discussed/Used WIC Program: No   Consult Status Consult Status: Follow-up Date: 02/28/17 Follow-up type: In-patient    Jannifer RodneyShoptaw, Nicey Krah Lynn 02/27/2017, 6:54 PM

## 2017-02-27 NOTE — Anesthesia Procedure Notes (Signed)
Epidural Patient location during procedure: OB Start time: 02/27/2017 3:45 AM End time: 02/27/2017 4:03 AM  Staffing Anesthesiologist: Shona SimpsonHOLLIS, Keyandre Pileggi D Performed: anesthesiologist   Preanesthetic Checklist Completed: patient identified, site marked, surgical consent, pre-op evaluation, timeout performed, IV checked, risks and benefits discussed and monitors and equipment checked  Epidural Patient position: sitting Prep: ChloraPrep Patient monitoring: heart rate, continuous pulse ox and blood pressure Approach: midline Location: L3-L4 Injection technique: LOR saline  Needle:  Needle type: Tuohy  Needle gauge: 17 G Needle length: 9 cm Catheter type: closed end flexible Catheter size: 20 Guage Test dose: negative and 1.5% lidocaine  Assessment Events: blood not aspirated, injection not painful, no injection resistance and no paresthesia  Additional Notes LOR @ 4.5  Pt moving throughout procedure. No complications noted.  Patient identified. Risks/Benefits/Options discussed with patient including but not limited to bleeding, infection, nerve damage, paralysis, failed block, incomplete pain control, headache, blood pressure changes, nausea, vomiting, reactions to medications, itching and postpartum back pain. Confirmed with bedside nurse the patient's most recent platelet count. Confirmed with patient that they are not currently taking any anticoagulation, have any bleeding history or any family history of bleeding disorders. Patient expressed understanding and wished to proceed. All questions were answered. Sterile technique was used throughout the entire procedure. Please see nursing notes for vital signs. Test dose was given through epidural catheter and negative prior to continuing to dose epidural or start infusion. Warning signs of high block given to the patient including shortness of breath, tingling/numbness in hands, complete motor block, or any concerning symptoms with  instructions to call for help. Patient was given instructions on fall risk and not to get out of bed. All questions and concerns addressed with instructions to call with any issues or inadequate analgesia.    Reason for block:procedure for pain

## 2017-02-27 NOTE — MAU Note (Signed)
Pt presents complaining of contractions every 2 minutes since 0120 this morning. Denies leaking or bleeding. Reports good fetal movement.

## 2017-02-27 NOTE — Anesthesia Preprocedure Evaluation (Signed)
Anesthesia Evaluation  Patient identified by MRN, date of birth, ID band Patient awake    Airway Mallampati: I       Dental  (+) Teeth Intact   Pulmonary neg pulmonary ROS,    breath sounds clear to auscultation       Cardiovascular negative cardio ROS   Rhythm:Regular Rate:Normal     Neuro/Psych  Headaches, PSYCHIATRIC DISORDERS Anxiety Depression    GI/Hepatic negative GI ROS, Neg liver ROS,   Endo/Other  negative endocrine ROS  Renal/GU negative Renal ROS  negative genitourinary   Musculoskeletal negative musculoskeletal ROS (+)   Abdominal   Peds negative pediatric ROS (+)  Hematology negative hematology ROS (+)   Anesthesia Other Findings   Reproductive/Obstetrics (+) Pregnancy                             Lab Results  Component Value Date   WBC 7.7 02/27/2017   HGB 11.8 (L) 02/27/2017   HCT 34.1 (L) 02/27/2017   MCV 76.6 (L) 02/27/2017   PLT 169 02/27/2017     Anesthesia Physical Anesthesia Plan  ASA: II  Anesthesia Plan: Epidural   Post-op Pain Management:    Induction:   Airway Management Planned:   Additional Equipment:   Intra-op Plan:   Post-operative Plan:   Informed Consent: I have reviewed the patients History and Physical, chart, labs and discussed the procedure including the risks, benefits and alternatives for the proposed anesthesia with the patient or authorized representative who has indicated his/her understanding and acceptance.     Plan Discussed with:   Anesthesia Plan Comments:         Anesthesia Quick Evaluation

## 2017-02-27 NOTE — H&P (Signed)
Kari [name] is a [age][address].o. female presenting for labor.  Pregnancy complicated by hx of IUFD, but normal serial US this pregnancy.  PTL with BMZ at 34 weeks this preg.  GBS+. OB History    Gravida Para Term Preterm AB Living   3 1   1 1  0   SAB TAB Ectopic Multiple Live Births   1     0 0     Past Medical History:  Diagnosis Date  . Anemia   . Anxiety   . Depression   . Headache    migrains  . IUFD (intrauterine fetal death) 2016   Past Surgical History:  Procedure Laterality Date  . APPENDECTOMY    . HERNIA REPAIR     Family History: family history is not on file. Social History:  reports that she has never smoked. She has never used smokeless tobacco. She reports that she does not drink alcohol or use drugs.     Maternal Diabetes: No Genetic Screening: Normal Maternal Ultrasounds/Referrals: Normal Fetal Ultrasounds or other Referrals:  None Maternal Substance Abuse:  No Significant Maternal Medications:  None Significant Maternal Lab Results:  None Other Comments:  None  ROS History Dilation: Lip/rim Effacement (%): 90 Station: -2 Exam by:: L.Stubbs, RN Blood pressure 102/64, pulse 95, temperature 98.5 F (36.9 C), temperature source Oral, resp. rate 16, height 5\' 3"  (1.6 m), weight 133 lb (60.3 kg), last menstrual period 06/05/2016, SpO2 100 %, unknown if currently breastfeeding. Exam Physical Exam  Prenatal labs: ABO, Rh: --/--/B POS (03/07 0243) Antibody: NEG (03/07 0243) Rubella: Immune (08/16 0000) RPR: Nonreactive (08/16 0000)  HBsAg: Negative (08/16 0000)  HIV: Non-reactive (08/16 0000)  GBS: Positive (02/27 0000)   Assessment/Plan: IUP  At term ACtive labor Anticipate SVD    Kerin Kren C 02/27/2017, 6:56 AM

## 2017-02-27 NOTE — Anesthesia Pain Management Evaluation Note (Signed)
  CRNA Pain Management Visit Note  Patient: Kari [name], 21 y.o., female  "Hello [facility] am a [facility]. We have an anesthesia team available at all times to provide care throughout the hospital, including epidural management and anesthesia for C-section. I don't know your plan for the delivery whether it a natural birth, water birth, IV sedation, nitrous supplementation, doula or epidural, but we want to meet your pain goals."   1.Was your pain managed to your expectations on prior hospitalizations?   No prior hospitalizations  2.What is your expectation for pain management during this hospitalization?     Epidural  3.How can we help you reach that goal? Epidural in place and working well  Record the patient's initial score and the patient's pain goal.   Pain: 0  Pain Goal: 4 [facility] wants you to be able to say your pain was always managed very well.  Kari [name] 02/27/2017

## 2017-02-28 LAB — CBC
HEMATOCRIT: 30.8 % — AB (ref 36.0–46.0)
HEMOGLOBIN: 10.7 g/dL — AB (ref 12.0–15.0)
MCH: 26.8 pg (ref 26.0–34.0)
MCHC: 34.7 g/dL (ref 30.0–36.0)
MCV: 77 fL — AB (ref 78.0–100.0)
Platelets: 142 10*3/uL — ABNORMAL LOW (ref 150–400)
RBC: 4 MIL/uL (ref 3.87–5.11)
RDW: 13.9 % (ref 11.5–15.5)
WBC: 10.1 10*3/uL (ref 4.0–10.5)

## 2017-02-28 LAB — RPR: RPR: NONREACTIVE

## 2017-02-28 NOTE — Lactation Note (Signed)
This note was copied from a baby's chart. Lactation Consultation Note  Patient Name: Kari Lattie HawCheyenne Belland ONGEX'BToday's Date: 02/28/2017   Infant had just unlatched & Mom was removing her nipple shield as I was entering room. Mom reports that nursing is going well. She reports hearing swallows & seeing colostrum in the nipple shield when the infant unlatches.   I explained how we like Moms to use a DEBP when a nipple shield has been initiated. Mom agreeable, but would like me to return at a later time to show her how to use. Mom has my # to call when ready for me to return.  Infant currently sleeping on Mom's chest.   [name], [name] [facility] 02/28/2017, 7:13 PM

## 2017-02-28 NOTE — Progress Notes (Signed)
CSW attempted to meet with MOB to complete assessment due to hx of Anx/Dep, but she was asleep at this time.  CSW will attempt again at a later time. 

## 2017-02-28 NOTE — Progress Notes (Signed)
CSW attempted again to meet with MOB, but she had visitors with her at this time.  CSW offered to return at a later time and MOB agreed.  She was pleasant and states she is doing well. 

## 2017-02-28 NOTE — Progress Notes (Signed)
Patient doing well. No complaints. Circ not to be done at hospital  BP (!) 103/56 (BP Location: Right Arm)   Pulse 82   Temp 98.3 F (36.8 C) (Oral)   Resp 16   Ht 5\' 3"  (1.6 m)   Wt 60.3 kg (133 lb)   LMP 06/05/2016   SpO2 100%   Breastfeeding? Unknown   BMI 23.56 kg/m  Abdomen is soft and non tender Results for orders placed or performed during the hospital encounter of 02/27/17 (from the past 24 hour(s))  CBC     Status: Abnormal   Collection Time: 02/28/17  5:42 AM  Result Value Ref Range   WBC 10.1 4.0 - 10.5 K/uL   RBC 4.00 3.87 - 5.11 MIL/uL   Hemoglobin 10.7 (L) 12.0 - 15.0 g/dL   HCT 16.130.8 (L) 09.636.0 - 04.546.0 %   MCV 77.0 (L) 78.0 - 100.0 fL   MCH 26.8 26.0 - 34.0 pg   MCHC 34.7 30.0 - 36.0 g/dL   RDW 40.913.9 81.111.5 - 91.415.5 %   Platelets 142 (L) 150 - 400 K/uL   PPD #1  Doing well Routine care Discharge tomorrow

## 2017-03-01 MED ORDER — IBUPROFEN 600 MG PO TABS: 600.0000 mg | ORAL_TABLET | Freq: Four times a day (QID) | ORAL | 1 refills | Status: DC | PRN

## 2017-03-01 MED ORDER — ACETAMINOPHEN 325 MG PO TABS: 650.0000 mg | ORAL_TABLET | Freq: Four times a day (QID) | ORAL | 1 refills | Status: DC | PRN

## 2017-03-01 NOTE — Plan of Care (Signed)
Problem: Pain Management: Goal: General experience of comfort will improve and pain level will decrease Outcome: Completed/Met Date Met: 03/01/17 Patient's pain is managed well by scheduled pain medicine, patient not experiencing any pain at this time.

## 2017-03-01 NOTE — Progress Notes (Signed)
CSW received consult for hx of anxiety and depression and met with MOB in her first floor room/115 to complete assessment and offer support.  [name] was present and MOB gave permission to speak openly with him in the room.  MOB reports she is feeling very well emotionally and both are happy about baby.  CSW understands that they had a loss at 30 weeks a little over a year ago and acknowledged to parents that while this is a joyous time, it may bring up emotions from the past.  MOB agreed.  MOB reports hx of depression surrounding her loss, but reports no current symptoms of depression.  CSW commented on the appropriateness of depressive symptoms while grieving.  MOB states she deals with anxiety on a regular basis, but does not feel that her symptoms are impacting or interfering with daily life.  Parents were receptive to education regarding PMADs as well as resources given should symptoms arise.  CSW thanked parents for allowing CSW to talk with them, congratulated them on the birth and their baby and encouraged them to speak with a medical professional if they have concerns about their emotional health at any time.   MOB seemed appreciative of CSW's concern for her emotional wellbeing.   SIDS precautions discussed and parents stated prior awareness.  They report having everything they need for infant at home and a good support system.  CSW identifies no further interventions needed or barriers to discharge when MOB and infant are medically ready.

## 2017-03-01 NOTE — Discharge Summary (Signed)
Obstetric Discharge Summary Reason for Admission: onset of labor Prenatal Procedures: none Intrapartum Procedures: spontaneous vaginal delivery Postpartum Procedures: none Complications-Operative and Postpartum: none Hemoglobin  Date Value Ref Range Status  02/28/2017 10.7 (L) 12.0 - 15.0 g/dL Final   HCT  Date Value Ref Range Status  02/28/2017 30.8 (L) 36.0 - 46.0 % Final    Physical Exam:  General: alert, cooperative and no distress Lochia: appropriate Uterine Fundus: firm Incision: healing well DVT Evaluation: No evidence of DVT seen on physical exam.  Discharge Diagnoses: Term Pregnancy-delivered  Discharge Information: Date: 03/01/2017 Activity: pelvic rest Diet: routine Medications: PNV and Ibuprofen Condition: stable Instructions: refer to practice specific booklet Discharge to: home   Newborn Data: Live born female  Birth Weight: 7 lb 7 oz (3374 g) APGAR: 9, 9  Home with mother.  Deyna Carbon II,Demetrie Borge E 03/01/2017, 9:26 AM

## 2017-03-01 NOTE — Lactation Note (Addendum)
This note was copied from a baby's chart. Lactation Consultation Note  P1, Baby 49 hours old and using #16NS to latch. Observed feeding.  Sucks and swallows observed. Placed pillow under baby to bring baby to nipple height  Taught mother how to hand express into NS and post pump. Recommend post pumping 4 times a day for 15 min and give volume back to baby at next feeding. Reviewed milk storage and cleaning. Provided mother with #16NS and #20NS. Reviewed engorgement care and monitoring voids/stools. Mom encouraged to feed baby 8-12 times/24 hours and with feeding cues.   Mother pumped approx 15 ml. Demonstrated how to use foley cup but mother chose to use slow flow nipple. Gave baby an additional 15 ml and burped baby.   Patient Name: Kari [name] ZOXWR'UToday's Date: 03/01/2017     Maternal Data    Feeding Feeding Type: Breast Fed Length of feed: 30 min  LATCH Score/Interventions                      Lactation Tools Discussed/Used     Consult Status      Kari [name], Kari [name] 03/01/2017, 9:33 AM

## 2017-09-02 ENCOUNTER — Emergency Department (HOSPITAL_BASED_OUTPATIENT_CLINIC_OR_DEPARTMENT_OTHER)
Admission: EM | Admit: 2017-09-02 | Discharge: 2017-09-02 | Disposition: A | Discharge status: Home / Self Care | Payer: 59 | Attending: Emergency Medicine | Admitting: Emergency Medicine

## 2017-09-02 ENCOUNTER — Encounter (HOSPITAL_BASED_OUTPATIENT_CLINIC_OR_DEPARTMENT_OTHER): Payer: Self-pay | Admitting: *Deleted

## 2017-09-02 DIAGNOSIS — R51 Headache: Secondary | ICD-10-CM | POA: Diagnosis not present

## 2017-09-02 DIAGNOSIS — R1084 Generalized abdominal pain: Secondary | ICD-10-CM | POA: Diagnosis not present

## 2017-09-02 DIAGNOSIS — R519 Headache, unspecified: Secondary | ICD-10-CM

## 2017-09-02 LAB — COMPREHENSIVE METABOLIC PANEL
ALT: 11 U/L — ABNORMAL LOW (ref 14–54)
AST: 17 U/L (ref 15–41)
Albumin: 4.1 g/dL (ref 3.5–5.0)
Alkaline Phosphatase: 88 U/L (ref 38–126)
Anion gap: 5 (ref 5–15)
BUN: 15 mg/dL (ref 6–20)
CO2: 27 mmol/L (ref 22–32)
Calcium: 9.2 mg/dL (ref 8.9–10.3)
Chloride: 107 mmol/L (ref 101–111)
Creatinine, Ser: 0.46 mg/dL (ref 0.44–1.00)
GFR calc Af Amer: >60 mL/min (ref >60)
GFR calc non Af Amer: >60 mL/min (ref >60)
Glucose, Bld: 86 mg/dL (ref 65–99)
Potassium: 3.9 mmol/L (ref 3.5–5.1)
Sodium: 139 mmol/L (ref 135–145)
Total Bilirubin: 0.6 mg/dL (ref 0.3–1.2)
Total Protein: 6.8 g/dL (ref 6.5–8.1)

## 2017-09-02 LAB — LIPASE, BLOOD: Lipase: 40 U/L (ref 11–51)

## 2017-09-02 LAB — CBC WITH DIFFERENTIAL/PLATELET
Basophils Absolute: 0 10*3/uL (ref 0.0–0.1)
Basophils Relative: 0 %
Eosinophils Absolute: 0.2 10*3/uL (ref 0.0–0.7)
Eosinophils Relative: 4 %
HCT: 35.5 % — ABNORMAL LOW (ref 36.0–46.0)
Hemoglobin: 12.2 g/dL (ref 12.0–15.0)
Lymphocytes Relative: 37 %
Lymphs Abs: 1.8 10*3/uL (ref 0.7–4.0)
MCH: 25.9 pg — ABNORMAL LOW (ref 26.0–34.0)
MCHC: 34.4 g/dL (ref 30.0–36.0)
MCV: 75.4 fL — ABNORMAL LOW (ref 78.0–100.0)
Monocytes Absolute: 0.4 10*3/uL (ref 0.1–1.0)
Monocytes Relative: 8 %
Neutro Abs: 2.4 10*3/uL (ref 1.7–7.7)
Neutrophils Relative %: 51 %
Platelets: 225 10*3/uL (ref 150–400)
RBC: 4.71 MIL/uL (ref 3.87–5.11)
RDW: 13.2 % (ref 11.5–15.5)
WBC: 4.8 10*3/uL (ref 4.0–10.5)

## 2017-09-02 LAB — URINALYSIS, ROUTINE W REFLEX MICROSCOPIC
BILIRUBIN URINE: NEGATIVE
GLUCOSE, UA: NEGATIVE mg/dL
HGB URINE DIPSTICK: NEGATIVE
KETONES UR: 15 mg/dL — AB
Nitrite: NEGATIVE
PH: 6 (ref 5.0–8.0)
PROTEIN: NEGATIVE mg/dL
Specific Gravity, Urine: 1.025 (ref 1.005–1.030)

## 2017-09-02 LAB — URINALYSIS, MICROSCOPIC (REFLEX): RBC / HPF: NONE SEEN RBC/hpf (ref 0–5)

## 2017-09-02 LAB — PREGNANCY, URINE: Preg Test, Ur: NEGATIVE

## 2017-09-02 MED ORDER — SODIUM CHLORIDE 0.9 % IV BOLUS (SEPSIS)
500.0000 mL | Freq: Once | INTRAVENOUS | Status: AC
  Administered 2017-09-02: 500 mL via INTRAVENOUS

## 2017-09-02 MED ORDER — KETOROLAC TROMETHAMINE 30 MG/ML IJ SOLN
30.0000 mg | Freq: Once | INTRAMUSCULAR | Status: AC
  Administered 2017-09-02: 30 mg via INTRAVENOUS
  Filled 2017-09-02: qty 1

## 2017-09-02 NOTE — ED Notes (Signed)
Water, crackers and peanut butter given per request

## 2017-09-02 NOTE — ED Triage Notes (Signed)
Pt c/o diffuse abd pain x 2 hrs nausea only

## 2017-09-02 NOTE — Discharge Instructions (Signed)
Please follow-up with your primary care provider if your symptoms continue. Please return to emergency department if you develop any new or worsening symptoms.

## 2017-09-03 NOTE — ED Provider Notes (Signed)
[facility] Provider Note   CSN: [id number] Arrival date & time: 09/02/17  1807     History   Chief Complaint Chief Complaint  Patient presents with  . Abdominal Pain    HPI Kari [name] is a 21 y.o. female with history of anemia, anxiety, depression who presents with diffuse abdominal pain. Her pain began 2 hours after eating pizza. Her pain lasted 30 minutes and resolved by the time she arrived to the ED. Patient had some associated nausea, but no vomiting or diarrhea. Patient also reports she is about one week late for her menstrual cycle. She has also had some intermittent episodes of lightheadedness over the past week, but none today. She denies any abnormal vaginal discharge or bleeding. She denies any urinary symptoms. She not taking any medications for her symptoms prior to arrival. Patient also reports headache for the past few days that is typical of her normal headaches. She denies any visual disturbances.  HPI  Past Medical History:  Diagnosis Date  . Anemia   . Anxiety   . Depression   . Headache    migrains  . IUFD (intrauterine fetal death) 04-May-2015    Patient Active Problem List   Diagnosis Date Noted  . Indication for care in labor or delivery 02/27/2017  . Preterm contractions 01/28/2017  . Preterm labor 01/27/2017  . Ovarian cyst during pregnancy 07/18/2016  . Fetal demise, greater than 22 weeks, antepartum 10/07/2015    Past Surgical History:  Procedure Laterality Date  . APPENDECTOMY    . HERNIA REPAIR      OB History    Gravida Para Term Preterm AB Living   SAB TAB Ectopic Multiple Live Births   1     0 1       Home Medications    Prior to Admission medications   Medication Sig Start Date End Date Taking? Authorizing Provider  acetaminophen (TYLENOL) 325 MG tablet Take 2 tablets (650 mg total) by mouth every 6 (six) hours as needed (for pain scale < 4). 03/01/17   Harold Hedge, MD  ibuprofen (ADVIL,MOTRIN)  600 MG tablet Take 1 tablet (600 mg total) by mouth every 6 (six) hours as needed. 03/01/17   Harold Hedge, MD    Family History Family History  Problem Relation Age of Onset  . Alcohol abuse Neg Hx   . Arthritis Neg Hx   . Asthma Neg Hx   . Birth defects Neg Hx   . Cancer Neg Hx   . COPD Neg Hx   . Depression Neg Hx   . Diabetes Neg Hx   . Drug abuse Neg Hx   . Early death Neg Hx   . Hearing loss Neg Hx   . Heart disease Neg Hx   . Hyperlipidemia Neg Hx   . Hypertension Neg Hx   . Kidney disease Neg Hx   . Learning disabilities Neg Hx   . Mental illness Neg Hx   . Mental retardation Neg Hx   . Miscarriages / Stillbirths Neg Hx   . Stroke Neg Hx   . Vision loss Neg Hx   . Varicose Veins Neg Hx     Social History Social History  Substance Use Topics  . Smoking status: Never Smoker  . Smokeless tobacco: Never Used  . Alcohol use No     Allergies   Patient has no known allergies.   Review of Systems Review of  Systems  Constitutional: Negative for chills and fever.  HENT: Negative for facial swelling and sore throat.   Eyes: Negative for visual disturbance.  Respiratory: Negative for shortness of breath.   Cardiovascular: Negative for chest pain.  Gastrointestinal: Positive for abdominal pain and nausea. Negative for diarrhea and vomiting.  Genitourinary: Negative for dysuria, vaginal bleeding and vaginal discharge.  Musculoskeletal: Negative for back pain.  Skin: Negative for rash and wound.  Neurological: Positive for headaches.  Psychiatric/Behavioral: The patient is not nervous/anxious.      Physical Exam Updated Vital Signs BP 111/75 (BP Location: Right Arm)   Pulse 60   Temp 98.3 F (36.8 C)   Resp 18   Ht  (1.575 m)   Wt 46.7 kg (103 lb)   LMP 07/29/2017   SpO2 99%   BMI 18.84 kg/m   Physical Exam  Constitutional: She appears well-developed and well-nourished. No distress.  HENT:  Head: Normocephalic and atraumatic.  Mouth/Throat:  Oropharynx is clear and moist. No oropharyngeal exudate.  Eyes: Pupils are equal, round, and reactive to light. Conjunctivae are normal. Right eye exhibits no discharge. Left eye exhibits no discharge. No scleral icterus.  Neck: Normal range of motion. Neck supple. No thyromegaly present.  Cardiovascular: Normal rate, regular rhythm, normal heart sounds and intact distal pulses.  Exam reveals no gallop and no friction rub.   No murmur heard. Pulmonary/Chest: Effort normal and breath sounds normal. No stridor. No respiratory distress. She has no wheezes. She has no rales.  Abdominal: Soft. Bowel sounds are normal. She exhibits no distension. There is no tenderness. There is no rebound and no guarding.  Musculoskeletal: She exhibits no edema.  Lymphadenopathy:    She has no cervical adenopathy.  Neurological: She is alert. Coordination normal.  CN 3-12 intact; normal sensation throughout; 5/5 strength in all 4 extremities; equal bilateral grip strength  Skin: Skin is warm and dry. No rash noted. She is not diaphoretic. No pallor.  Psychiatric: She has a normal mood and affect.  Nursing note and vitals reviewed.    ED Treatments / Results  Labs (all labs ordered are listed, but only abnormal results are displayed) Labs Reviewed  URINALYSIS, ROUTINE W REFLEX MICROSCOPIC - Abnormal; Notable for the following:       Result Value   APPearance CLOUDY (*)    Ketones, ur 15 (*)    Leukocytes, UA TRACE (*)    All other components within normal limits  URINALYSIS, MICROSCOPIC (REFLEX) - Abnormal; Notable for the following:    Bacteria, UA FEW (*)    Squamous Epithelial / LPF 6-30 (*)    All other components within normal limits  COMPREHENSIVE METABOLIC PANEL - Abnormal; Notable for the following:    ALT 11 (*)    All other components within normal limits  CBC WITH DIFFERENTIAL/PLATELET - Abnormal; Notable for the following:    HCT 35.5 (*)    MCV 75.4 (*)    MCH 25.9 (*)    All other  components within normal limits  PREGNANCY, URINE  LIPASE, BLOOD    EKG  EKG Interpretation None       Radiology No results found.  Procedures Procedures (including critical care time)  Medications Ordered in ED Medications  ketorolac (TORADOL) 30 MG/ML injection 30 mg (30 mg Intravenous Given 09/02/17 2128)  sodium chloride 0.9 % bolus 500 mL (0 mLs Intravenous Stopped 09/02/17 2236)     Initial Impression / Assessment and Plan / ED Course  I  have reviewed the triage vital signs and the nursing notes.  Pertinent labs & imaging results that were available during my care of the patient were reviewed by me and considered in my medical decision making (see chart for details).     Patient with several vague, resolved symptoms. Patient poor historian. CBC, CMP, lipase unremarkable. UA shows trace hematuria, 15 ketones, few bacteria, also dirty sample. Urine pregnancy negative. Headache resolved with IV Toradol and normal saline bolus. No focal abdominal tenderness. I feel patient is safe for follow-up to PCP. She is asymptomatic at this time. Return precautions discussed. Patient understands and agrees with plan. Patient vitals stable throughout ED course and discharged in satisfactory condition.  Final Clinical Impressions(s) / ED Diagnoses   Final diagnoses:  Generalized abdominal pain  Bad headache    New Prescriptions Discharge Medication List as of 09/02/2017 10:46 PM       Emi HolesLaw, Tamryn Popko M, PA-C 09/03/17 0148    Loren RacerYelverton, David, MD 09/09/17 1527

## 2017-09-04 ENCOUNTER — Inpatient Hospital Stay (HOSPITAL_COMMUNITY)
Admission: AD | Admit: 2017-09-04 | Discharge: 2017-09-04 | Disposition: A | Discharge status: Home / Self Care | Payer: 59 | Source: Ambulatory Visit | Attending: Obstetrics and Gynecology | Admitting: Obstetrics and Gynecology

## 2017-09-04 ENCOUNTER — Encounter (HOSPITAL_COMMUNITY): Payer: Self-pay | Admitting: *Deleted

## 2017-09-04 DIAGNOSIS — N910 Primary amenorrhea: Secondary | ICD-10-CM | POA: Diagnosis not present

## 2017-09-04 DIAGNOSIS — N926 Irregular menstruation, unspecified: Secondary | ICD-10-CM | POA: Diagnosis not present

## 2017-09-04 DIAGNOSIS — R102 Pelvic and perineal pain: Secondary | ICD-10-CM

## 2017-09-04 DIAGNOSIS — R1030 Lower abdominal pain, unspecified: Secondary | ICD-10-CM | POA: Diagnosis present

## 2017-09-04 DIAGNOSIS — F419 Anxiety disorder, unspecified: Secondary | ICD-10-CM | POA: Insufficient documentation

## 2017-09-04 DIAGNOSIS — F329 Major depressive disorder, single episode, unspecified: Secondary | ICD-10-CM | POA: Insufficient documentation

## 2017-09-04 LAB — WET PREP, GENITAL
CLUE CELLS WET PREP: NONE SEEN
Sperm: NONE SEEN
TRICH WET PREP: NONE SEEN
Yeast Wet Prep HPF POC: NONE SEEN

## 2017-09-04 LAB — URINALYSIS, ROUTINE W REFLEX MICROSCOPIC
Bilirubin Urine: NEGATIVE
Glucose, UA: NEGATIVE mg/dL
HGB URINE DIPSTICK: NEGATIVE
Ketones, ur: 5 mg/dL — AB
Leukocytes, UA: NEGATIVE
NITRITE: NEGATIVE
PROTEIN: NEGATIVE mg/dL
SPECIFIC GRAVITY, URINE: 1.015 (ref 1.005–1.030)
pH: 5 (ref 5.0–8.0)

## 2017-09-04 LAB — CBC
HCT: 34.8 % — ABNORMAL LOW (ref 36.0–46.0)
Hemoglobin: 11.9 g/dL — ABNORMAL LOW (ref 12.0–15.0)
MCH: 26 pg (ref 26.0–34.0)
MCHC: 34.2 g/dL (ref 30.0–36.0)
MCV: 76.1 fL — ABNORMAL LOW (ref 78.0–100.0)
PLATELETS: 186 10*3/uL (ref 150–400)
RBC: 4.57 MIL/uL (ref 3.87–5.11)
RDW: 13.9 % (ref 11.5–15.5)
WBC: 4 10*3/uL (ref 4.0–10.5)

## 2017-09-04 LAB — HCG, SERUM, QUALITATIVE: PREG SERUM: NEGATIVE

## 2017-09-04 LAB — POCT PREGNANCY, URINE: PREG TEST UR: NEGATIVE

## 2017-09-04 NOTE — MAU Note (Signed)
Pt presents with c/o no menstrual cycyle, lower abdominal cramping, and HA.  Pt states she spotted 1 day last week.  Pt states had - UPT @ home.

## 2017-09-04 NOTE — MAU Provider Note (Signed)
Chief Complaint: Possible Pregnancy and Abdominal Pain   First Provider Initiated Contact with Patient 09/04/17 1611      SUBJECTIVE HPI: Kari [name] is a 21 y.o. [id number] who presents to maternity admissions reporting abdominal cramping and late menses. She had a negative pregnancy test at home this week but her symptoms made her concerned so she came to be evaluated.  Patient's last menstrual period was 07/28/2017.  She did have light spotting 1 week ago but this was unusually light for her. Her late menses and spotting have been associated with intermittent headaches.  She has not tried any treatments.  She reports periods are usually regular, moderate in amount and last 3-5 days.  She is not using contraception and is sexually active. She had vaginal delivery 02/27/17 of term baby without complications with prior hx of 30 week IUFD in 05-01-15.   She denies vaginal bleeding, vaginal itching/burning, urinary symptoms, h/a, dizziness, n/v, or fever/chills.     HPI  Past Medical History:  Diagnosis Date  . Anemia   . Anxiety   . Depression   . Headache    migrains  . IUFD (intrauterine fetal death) 01-May-2015   Past Surgical History:  Procedure Laterality Date  . APPENDECTOMY    . HERNIA REPAIR     Social History   Social History  . Marital status: Single    Spouse name: N/A  . Number of children: N/A  . Years of education: N/A   Occupational History  . Not on file.   Social History Main Topics  . Smoking status: Never Smoker  . Smokeless tobacco: Never Used  . Alcohol use No  . Drug use: No  . Sexual activity: Yes    Birth control/ protection: None     Comment: last intercourse Nov 17   Other Topics Concern  . Not on file   Social History Narrative  . No narrative on file   No current facility-administered medications on file prior to encounter.    Current Outpatient Prescriptions on File Prior to Encounter  Medication Sig Dispense Refill  . acetaminophen  (TYLENOL) 325 MG tablet Take 2 tablets (650 mg total) by mouth every 6 (six) hours as needed (for pain scale < 4). 30 tablet 1  . ibuprofen (ADVIL,MOTRIN) 600 MG tablet Take 1 tablet (600 mg total) by mouth every 6 (six) hours as needed. 30 tablet 1   No Known Allergies  ROS:  Review of Systems  Constitutional: Negative for chills, fatigue and fever.  Respiratory: Negative for shortness of breath.   Cardiovascular: Negative for chest pain.  Gastrointestinal: Negative for nausea and vomiting.  Genitourinary: Positive for pelvic pain. Negative for difficulty urinating, dysuria, flank pain, vaginal bleeding, vaginal discharge and vaginal pain.  Neurological: Positive for headaches. Negative for dizziness.  Psychiatric/Behavioral: Negative.      I have reviewed patient's Past Medical Hx, Surgical Hx, Family Hx, Social Hx, medications and allergies.   Physical Exam   Patient Vitals for the past 24 hrs:  BP Temp Temp src Pulse Resp SpO2 Height Weight  09/04/17 1455 102/61 98.5 F (36.9 C) Oral 75 16 100 %  (1.575 m) 103 lb (46.7 kg)   Constitutional: Well-developed, well-nourished female in no acute distress.  Cardiovascular: normal rate Respiratory: normal effort GI: Abd soft, non-tender. Pos BS x 4 MS: Extremities nontender, no edema, normal ROM Neurologic: Alert and oriented x 4.  GU: Neg CVAT.  PELVIC EXAM: Cervix pink, visually closed, without lesion,  scant white creamy discharge, vaginal walls and external genitalia normal Bimanual exam: Cervix 0/long/high, firm, anterior, neg CMT, uterus nontender, nonenlarged, adnexa without tenderness, enlargement, or mass    LAB RESULTS Results for orders placed or performed during the hospital encounter of 09/04/17 (from the past 24 hour(s))  Urinalysis, Routine w reflex microscopic     Status: Abnormal   Collection Time: 09/04/17  2:49 PM  Result Value Ref Range   Color, Urine YELLOW YELLOW   APPearance CLEAR CLEAR   Specific  Gravity, Urine 1.015 1.005 - 1.030   pH 5.0 5.0 - 8.0   Glucose, UA NEGATIVE NEGATIVE mg/dL   Hgb urine dipstick NEGATIVE NEGATIVE   Bilirubin Urine NEGATIVE NEGATIVE   Ketones, ur 5 (A) NEGATIVE mg/dL   Protein, ur NEGATIVE NEGATIVE mg/dL   Nitrite NEGATIVE NEGATIVE   Leukocytes, UA NEGATIVE NEGATIVE  Pregnancy, urine POC     Status: None   Collection Time: 09/04/17  3:48 PM  Result Value Ref Range   Preg Test, Ur NEGATIVE NEGATIVE  CBC     Status: Abnormal   Collection Time: 09/04/17  4:13 PM  Result Value Ref Range   WBC 4.0 4.0 - 10.5 K/uL   RBC 4.57 3.87 - 5.11 MIL/uL   Hemoglobin 11.9 (L) 12.0 - 15.0 g/dL   HCT 60.4 (L) 54.0 - 98.1 %   MCV 76.1 (L) 78.0 - 100.0 fL   MCH 26.0 26.0 - 34.0 pg   MCHC 34.2 30.0 - 36.0 g/dL   RDW 19.1 47.8 - 29.5 %   Platelets 186 150 - 400 K/uL  hCG, serum, qualitative     Status: None   Collection Time: 09/04/17  4:13 PM  Result Value Ref Range   Preg, Serum NEGATIVE NEGATIVE  Wet prep, genital     Status: Abnormal   Collection Time: 09/04/17  4:22 PM  Result Value Ref Range   Yeast Wet Prep HPF POC NONE SEEN NONE SEEN   Trich, Wet Prep NONE SEEN NONE SEEN   Clue Cells Wet Prep HPF POC NONE SEEN NONE SEEN   WBC, Wet Prep HPF POC MANY (A) NONE SEEN   Sperm NONE SEEN     --/--/B POS (03/07 0243)  IMAGING No results found.  MAU Management/MDM: Ordered labs and reviewed results.  No acute abdomen or other acute findings on today's exam. GC pending.  Discussed results with pt at time of visit.  Pregnancy test, both urine and serum negative.  Discussed LARCs as most effective forms of birth control.  Pt declines contraception at this time. Recommend condom use for STD prevention.  Consult Dr Henderson Cloud.  Pt to follow up with Dr Rana Snare if irregular periods/cramping persist.  Return to MAU as needed for emergencies.  Pt stable at time of discharge.  ASSESSMENT 1. Late menses   2. Acute pelvic pain, female     PLAN Discharge home Allergies  as of 09/04/2017   No Known Allergies     Medication List    TAKE these medications   acetaminophen 325 MG tablet Commonly known as:  TYLENOL Take 2 tablets (650 mg total) by mouth every 6 (six) hours as needed (for pain scale < 4).   ibuprofen 600 MG tablet Commonly known as:  ADVIL,MOTRIN Take 1 tablet (600 mg total) by mouth every 6 (six) hours as needed.            Discharge Care Instructions        Start  Ordered   09/04/17 0000  Discharge patient    Question Answer Comment  Discharge disposition 01-Home or Self Care   Discharge patient date 09/04/2017      09/04/17 1707     Follow-up Information    Candice CampLowe, David, MD Follow up.   Specialty:  Obstetrics and Gynecology Why:  Return to MAU as needed for emergencies Contact information: [address], [address] [place] [phone]           Sharen CounterLisa Leftwich-Kirby Certified Nurse-Midwife 09/04/2017  5:11 PM

## 2017-09-04 NOTE — MAU Note (Signed)
Pt reports she had some spotting one week ago, LMP 08/05. Also reports she is having some cramping and had a negative home preg test but her period still hasn't started.

## 2017-09-05 LAB — GC/CHLAMYDIA PROBE AMP (~~LOC~~) NOT AT ARMC
Chlamydia: NEGATIVE
NEISSERIA GONORRHEA: NEGATIVE

## 2017-12-24 ENCOUNTER — Inpatient Hospital Stay (HOSPITAL_COMMUNITY)
Admission: AD | Admit: 2017-12-24 | Discharge: 2017-12-24 | Disposition: A | Discharge status: Home / Self Care | Payer: 59 | Source: Ambulatory Visit | Attending: Obstetrics and Gynecology | Admitting: Obstetrics and Gynecology

## 2017-12-24 ENCOUNTER — Inpatient Hospital Stay (HOSPITAL_COMMUNITY): Payer: 59

## 2017-12-24 ENCOUNTER — Encounter: Payer: Self-pay | Admitting: Student

## 2017-12-24 DIAGNOSIS — O09291 Supervision of pregnancy with other poor reproductive or obstetric history, first trimester: Secondary | ICD-10-CM | POA: Diagnosis not present

## 2017-12-24 DIAGNOSIS — Z3491 Encounter for supervision of normal pregnancy, unspecified, first trimester: Secondary | ICD-10-CM

## 2017-12-24 DIAGNOSIS — N939 Abnormal uterine and vaginal bleeding, unspecified: Secondary | ICD-10-CM | POA: Diagnosis present

## 2017-12-24 DIAGNOSIS — Z3A10 10 weeks gestation of pregnancy: Secondary | ICD-10-CM | POA: Diagnosis not present

## 2017-12-24 DIAGNOSIS — O209 Hemorrhage in early pregnancy, unspecified: Secondary | ICD-10-CM | POA: Diagnosis not present

## 2017-12-24 DIAGNOSIS — Z3A09 9 weeks gestation of pregnancy: Secondary | ICD-10-CM

## 2017-12-24 DIAGNOSIS — O200 Threatened abortion: Secondary | ICD-10-CM

## 2017-12-24 LAB — URINALYSIS, ROUTINE W REFLEX MICROSCOPIC
BILIRUBIN URINE: NEGATIVE
Glucose, UA: NEGATIVE mg/dL
KETONES UR: NEGATIVE mg/dL
Leukocytes, UA: NEGATIVE
Nitrite: NEGATIVE
Protein, ur: NEGATIVE mg/dL
SPECIFIC GRAVITY, URINE: 1.016 (ref 1.005–1.030)
pH: 6 (ref 5.0–8.0)

## 2017-12-24 LAB — POCT PREGNANCY, URINE: PREG TEST UR: POSITIVE — AB

## 2017-12-24 NOTE — MAU Note (Signed)
Pt. On her way to work, started leaking clear fluid, after she arrived here, pt. Started bleeding. Bright red blood observed by nurse, pad put in place. Pt. Denies pain, but feels "gas".  Pt. States, pregnancy is documented at her current practice - [facility].

## 2017-12-24 NOTE — Discharge Instructions (Signed)
Threatened Miscarriage °A threatened miscarriage occurs when you have vaginal bleeding during your first 20 weeks of pregnancy but the pregnancy has not ended. If you have vaginal bleeding during this time, your health care provider will do tests to make sure you are still pregnant. If the tests show you are still pregnant and the developing baby (fetus) inside your womb (uterus) is still growing, your condition is considered a threatened miscarriage. °A threatened miscarriage does not mean your pregnancy will end, but it does increase the risk of losing your pregnancy (complete miscarriage). °What are the causes? °The cause of a threatened miscarriage is usually not known. If you go on to have a complete miscarriage, the most common cause is an abnormal number of chromosomes in the developing baby. Chromosomes are the structures inside cells that hold all your genetic material. °Some causes of vaginal bleeding that do not result in miscarriage include: °· Having sex. °· Having an infection. °· Normal hormone changes of pregnancy. °· Bleeding that occurs when an egg implants in your uterus. ° °What increases the risk? °Risk factors for bleeding in early pregnancy include: °· Obesity. °· Smoking. °· Drinking excessive amounts of alcohol or caffeine. °· Recreational drug use. ° °What are the signs or symptoms? °· Light vaginal bleeding. °· Mild abdominal pain or cramps. °How is this diagnosed? °If you have bleeding with or without abdominal pain before 20 weeks of pregnancy, your health care provider will do tests to check whether you are still pregnant. One important test involves using sound waves and a computer (ultrasound) to create images of the inside of your uterus. Other tests include an internal exam of your vagina and uterus (pelvic exam) and measurement of your baby’s heart rate. °You may be diagnosed with a threatened miscarriage if: °· Ultrasound testing shows you are still pregnant. °· Your baby’s heart  rate is strong. °· A pelvic exam shows that the opening between your uterus and your vagina (cervix) is closed. °· Your heart rate and blood pressure are stable. °· Blood tests confirm you are still pregnant. ° °How is this treated? °No treatments have been shown to prevent a threatened miscarriage from going on to a complete miscarriage. However, the right home care is important. °Follow these instructions at home: °· Make sure you keep all your appointments for prenatal care. This is very important. °· Get plenty of rest. °· Do not have sex or use tampons if you have vaginal bleeding. °· Do not douche. °· Do not smoke or use recreational drugs. °· Do not drink alcohol. °· Avoid caffeine. °Contact a health care provider if: °· You have light vaginal bleeding or spotting while pregnant. °· You have abdominal pain or cramping. °· You have a fever. °Get help right away if: °· You have heavy vaginal bleeding. °· You have blood clots coming from your vagina. °· You have severe low back pain or abdominal cramps. °· You have fever, chills, and severe abdominal pain. °This information is not intended to replace advice given to you by your health care provider. Make sure you discuss any questions you have with your health care provider. °Document Released: 12/10/2005 Document Revised: 05/17/2016 Document Reviewed: 10/06/2013 °[employer]vier Interactive Patient Education © 2018 [employer] ° °

## 2017-12-24 NOTE — MAU Provider Note (Signed)
History     CSN: [id number]  Arrival date and time: 12/24/17 9562   First Provider Initiated Contact with Patient 12/24/17 0932      Chief Complaint  Patient presents with  . Vaginal Bleeding   HPI  Kari [name] is a 22 y.o. [id number] at [redacted]w[redacted]d who presents with vaginal bleeding. Was seen in office 2 weeks ago & had ultrasound showing IUP with cardiac activity with EDD of 08/23/18.  Bleeding started this morning. Reports bright red bleeding in her underwear. Has not saturated pads or passed clots. No recent intercourse or exams. Denies abdominal pain.   OB History    Gravida Para Term Preterm AB Living   4 2 1 1 1 1    SAB TAB Ectopic Multiple Live Births   1     0 1      Past Medical History:  Diagnosis Date  . Anemia   . Anxiety   . Depression   . Headache    migrains  . IUFD (intrauterine fetal death) 2015/05/29    Past Surgical History:  Procedure Laterality Date  . APPENDECTOMY    . HERNIA REPAIR      Family History  Problem Relation Age of Onset  . Alcohol abuse Neg Hx   . Arthritis Neg Hx   . Asthma Neg Hx   . Birth defects Neg Hx   . Cancer Neg Hx   . COPD Neg Hx   . Depression Neg Hx   . Diabetes Neg Hx   . Drug abuse Neg Hx   . Early death Neg Hx   . Hearing loss Neg Hx   . Heart disease Neg Hx   . Hyperlipidemia Neg Hx   . Hypertension Neg Hx   . Kidney disease Neg Hx   . Learning disabilities Neg Hx   . Mental illness Neg Hx   . Mental retardation Neg Hx   . Miscarriages / Stillbirths Neg Hx   . Stroke Neg Hx   . Vision loss Neg Hx   . Varicose Veins Neg Hx     Social History   Tobacco Use  . Smoking status: Never Smoker  . Smokeless tobacco: Never Used  Substance Use Topics  . Alcohol use: No  . Drug use: No    Allergies: No Known Allergies  Medications Prior to Admission  Medication Sig Dispense Refill Last Dose  . acetaminophen (TYLENOL) 325 MG tablet Take 2 tablets (650 mg total) by mouth every 6 (six) hours as needed (for  pain scale < 4). 30 tablet 1   . ibuprofen (ADVIL,MOTRIN) 600 MG tablet Take 1 tablet (600 mg total) by mouth every 6 (six) hours as needed. 30 tablet 1     Review of Systems  Constitutional: Negative.   Gastrointestinal: Negative.   Genitourinary: Positive for vaginal bleeding.   Physical Exam   Blood pressure 109/60, pulse 80, temperature 98.5 F (36.9 C), temperature source Oral, resp. rate 17, height 5\' 2"  (1.575 m), weight 100 lb (45.4 kg), SpO2 99 %, unknown if currently breastfeeding.  Physical Exam  Nursing note and vitals reviewed. Constitutional: She is oriented to person, place, and time. She appears well-developed and well-nourished. No distress.  HENT:  Head: Normocephalic and atraumatic.  Eyes: Conjunctivae are normal. Right eye exhibits no discharge. Left eye exhibits no discharge. No scleral icterus.  Neck: Normal range of motion.  Respiratory: Effort normal. No respiratory distress.  GI: Soft. There is no tenderness.  Genitourinary:  Genitourinary  Comments: Cervix closed.  Small amount of dark red/brown blood  Neurological: She is alert and oriented to person, place, and time.  Skin: Skin is warm and dry. She is not diaphoretic.  Psychiatric: She has a normal mood and affect. Her behavior is normal. Judgment and thought content normal.    MAU Course  Procedures Results for orders placed or performed during the hospital encounter of 12/24/17 (from the past 24 hour(s))  Urinalysis, Routine w reflex microscopic     Status: Abnormal   Collection Time: 12/24/17  9:00 AM  Result Value Ref Range   Color, Urine YELLOW YELLOW   APPearance CLEAR CLEAR   Specific Gravity, Urine 1.016 1.005 - 1.030   pH 6.0 5.0 - 8.0   Glucose, UA NEGATIVE NEGATIVE mg/dL   Hgb urine dipstick LARGE (A) NEGATIVE   Bilirubin Urine NEGATIVE NEGATIVE   Ketones, ur NEGATIVE NEGATIVE mg/dL   Protein, ur NEGATIVE NEGATIVE mg/dL   Nitrite NEGATIVE NEGATIVE   Leukocytes, UA NEGATIVE NEGATIVE    RBC / HPF TOO NUMEROUS TO COUNT 0 - 5 RBC/hpf   WBC, UA 0-5 0 - 5 WBC/hpf   Bacteria, UA RARE (A) NONE SEEN   Squamous Epithelial / LPF 0-5 (A) NONE SEEN   Mucus PRESENT   Pregnancy, urine POC     Status: Abnormal   Collection Time: 12/24/17  9:30 AM  Result Value Ref Range   Preg Test, Ur POSITIVE (A) NEGATIVE   [place] Ob Comp Less 14 Wks  Result Date: 12/24/2017 CLINICAL DATA:  22 year old pregnant female presents with vaginal bleeding. EDC by LMP: 07/23/2018, projecting to an expected gestational age of [redacted] weeks 6 days EXAM: OBSTETRIC <14 WK ULTRASOUND TECHNIQUE: Transabdominal ultrasound was performed for evaluation of the gestation as well as the maternal uterus and adnexal regions. COMPARISON:  No prior scans from this gestation. FINDINGS: Intrauterine gestational sac: Single intrauterine gestational sac appears normal in size, shape and position. Yolk sac:  Visualized. Fetus: Visualized. Fetal anatomy not assessed at this early gestational age. Fetal Cardiac Activity: Visualized. Fetal Heart Rate: 173 bpm CRL:   31.9  mm   10 w 0 d                  US EDC: 07/22/2018 Subchorionic hemorrhage:  None visualized. Maternal uterus/adnexae: Right ovary measures 3.4 x 1.6 x 2.5 cm. Left ovary measures 3.8 x 2.1 x 2.5 cm. No abnormal ovarian or adnexal masses. No uterine fibroids. No abnormal free fluid in the pelvis. IMPRESSION: 1. Single living intrauterine gestation at 10 weeks 0 days by crown-rump length, concordant with provided menstrual dating. 2. No acute first-trimester gestational abnormality. Electronically Signed   By: Delbert PhenixJason A Poff M.D.   On: 12/24/2017 11:27    MDM B positive Small amount of brown blood on exam & cervix closed Ultrasound shows IUP with FHR 173 C/w Dr. Marcelle OverlieHolland. Notified of exam & ultrasound. Ok to discharge home.   Assessment and Plan  A: 1. Threatened miscarriage   2. Vaginal bleeding in pregnancy, first trimester   3. Normal IUP (intrauterine pregnancy) on prenatal  ultrasound, first trimester   4. [redacted] weeks gestation of pregnancy    P: Discharge home Pelvic rest Discussed reasons to return to MAU Keep f/u with OB  Judeth HornErin Alyssa Rotondo 12/24/2017, 9:32 AM

## 2017-12-24 NOTE — L&D Delivery Note (Signed)
Delivery Note Progressed to complete dilation and felt pressure. Pushed effectively to SVD over just a few contractions.  At 12:45 AM a viable and healthy female was delivered via Vaginal, Spontaneous (Presentation: OA).  APGAR: 6, 9; weight  .   Placenta status: spontaneous and grossly intact with 3 vessel cord  Cord:  with the following complications: none  Anesthesia:  epidural Episiotomy:  none Lacerations: Perineal abrasion Suture Repair: none Est. Blood Loss (mL):   50  Mom to postpartum.  Baby to Couplet care / Skin to Skin.  Kari BourgeoisMarie Trip [name] 07/13/2018, 1:21 AM  [facility] Please schedule this patient for Postpartum visit in: 4 weeks with the following provider: Any provider For C/S patients schedule nurse incision check in weeks 2 weeks: no High risk pregnancy complicated by: History of stillbirth Delivery mode:  SVD Anticipated Birth Control:  other/unsure PP Procedures needed: none  Schedule Integrated BH visit: no

## 2018-01-03 ENCOUNTER — Inpatient Hospital Stay (HOSPITAL_COMMUNITY)
Admission: AD | Admit: 2018-01-03 | Discharge: 2018-01-03 | Disposition: A | Discharge status: Home / Self Care | Payer: 59 | Source: Ambulatory Visit | Attending: Obstetrics and Gynecology | Admitting: Obstetrics and Gynecology

## 2018-01-03 ENCOUNTER — Encounter (HOSPITAL_COMMUNITY): Payer: Self-pay

## 2018-01-03 DIAGNOSIS — F419 Anxiety disorder, unspecified: Secondary | ICD-10-CM | POA: Diagnosis not present

## 2018-01-03 DIAGNOSIS — F329 Major depressive disorder, single episode, unspecified: Secondary | ICD-10-CM | POA: Diagnosis not present

## 2018-01-03 DIAGNOSIS — O209 Hemorrhage in early pregnancy, unspecified: Secondary | ICD-10-CM | POA: Insufficient documentation

## 2018-01-03 DIAGNOSIS — O26891 Other specified pregnancy related conditions, first trimester: Secondary | ICD-10-CM

## 2018-01-03 DIAGNOSIS — Z3A11 11 weeks gestation of pregnancy: Secondary | ICD-10-CM | POA: Diagnosis not present

## 2018-01-03 DIAGNOSIS — Z79899 Other long term (current) drug therapy: Secondary | ICD-10-CM | POA: Insufficient documentation

## 2018-01-03 DIAGNOSIS — O99341 Other mental disorders complicating pregnancy, first trimester: Secondary | ICD-10-CM | POA: Diagnosis not present

## 2018-01-03 DIAGNOSIS — Z3491 Encounter for supervision of normal pregnancy, unspecified, first trimester: Secondary | ICD-10-CM

## 2018-01-03 DIAGNOSIS — N898 Other specified noninflammatory disorders of vagina: Secondary | ICD-10-CM | POA: Diagnosis not present

## 2018-01-03 LAB — URINALYSIS, ROUTINE W REFLEX MICROSCOPIC
Bilirubin Urine: NEGATIVE
GLUCOSE, UA: NEGATIVE mg/dL
KETONES UR: NEGATIVE mg/dL
LEUKOCYTES UA: NEGATIVE
Nitrite: NEGATIVE
Protein, ur: NEGATIVE mg/dL
SPECIFIC GRAVITY, URINE: 1.018 (ref 1.005–1.030)
pH: 5 (ref 5.0–8.0)

## 2018-01-03 LAB — CBC
HCT: 33.6 % — ABNORMAL LOW (ref 36.0–46.0)
HEMOGLOBIN: 11.6 g/dL — AB (ref 12.0–15.0)
MCH: 26.2 pg (ref 26.0–34.0)
MCHC: 34.5 g/dL (ref 30.0–36.0)
MCV: 75.8 fL — ABNORMAL LOW (ref 78.0–100.0)
Platelets: 186 10*3/uL (ref 150–400)
RBC: 4.43 MIL/uL (ref 3.87–5.11)
RDW: 13.5 % (ref 11.5–15.5)
WBC: 6.3 10*3/uL (ref 4.0–10.5)

## 2018-01-03 LAB — WET PREP, GENITAL
CLUE CELLS WET PREP: NONE SEEN
SPERM: NONE SEEN
TRICH WET PREP: NONE SEEN
YEAST WET PREP: NONE SEEN

## 2018-01-03 NOTE — MAU Note (Signed)
Pt reports vaginal bleeding that started today.

## 2018-01-03 NOTE — Discharge Instructions (Signed)

## 2018-01-03 NOTE — MAU Provider Note (Signed)
History     CSN: [id number]  Arrival date and time: 01/03/18 1334   First Provider Initiated Contact with Patient 01/03/18 1407     Chief Complaint  Patient presents with  . Vaginal Bleeding   HPI Kari [name] is a 22 y.o. [phone] at 6165w2d who presents with vaginal bleeding. She states at 1230 she had bright red vaginal bleeding like a period for 20 minutes that has since stopped. She denies any pain. She denies any recent intercourse or urinary symptoms. She states this happened once at the beginning of the month but states everything was fine. She gets prenatal care at [facility] and reports no problems during this pregnancy.   OB History    Gravida Para Term Preterm AB Living   4 2 1 1 1 1    SAB TAB Ectopic Multiple Live Births   1     0 1      Past Medical History:  Diagnosis Date  . Anemia   . Anxiety   . Depression   . Headache    migrains  . IUFD (intrauterine fetal death) 2016    Past Surgical History:  Procedure Laterality Date  . APPENDECTOMY    . HERNIA REPAIR      Family History  Problem Relation Age of Onset  . Alcohol abuse Neg Hx   . Arthritis Neg Hx   . Asthma Neg Hx   . Birth defects Neg Hx   . Cancer Neg Hx   . COPD Neg Hx   . Depression Neg Hx   . Diabetes Neg Hx   . Drug abuse Neg Hx   . Early death Neg Hx   . Hearing loss Neg Hx   . Heart disease Neg Hx   . Hyperlipidemia Neg Hx   . Hypertension Neg Hx   . Kidney disease Neg Hx   . Learning disabilities Neg Hx   . Mental illness Neg Hx   . Mental retardation Neg Hx   . Miscarriages / Stillbirths Neg Hx   . Stroke Neg Hx   . Vision loss Neg Hx   . Varicose Veins Neg Hx     Social History   Tobacco Use  . Smoking status: Never Smoker  . Smokeless tobacco: Never Used  Substance Use Topics  . Alcohol use: No  . Drug use: No    Allergies: No Known Allergies  Medications Prior to Admission  Medication Sig Dispense Refill Last Dose  . Prenatal Vit-Fe  Fumarate-FA (PRENATAL MULTIVITAMIN) TABS tablet Take 1 tablet by mouth daily at 12 noon.   01/02/2018 at Unknown time  . acetaminophen (TYLENOL) 325 MG tablet Take 2 tablets (650 mg total) by mouth every 6 (six) hours as needed (for pain scale < 4). (Patient not taking: Reported on 01/03/2018) 30 tablet 1 Not Taking at Unknown time    Review of Systems  Constitutional: Negative.  Negative for fatigue and fever.  HENT: Negative.   Respiratory: Negative.  Negative for shortness of breath.   Cardiovascular: Negative.  Negative for chest pain.  Gastrointestinal: Negative.  Negative for abdominal pain, constipation, diarrhea, nausea and vomiting.  Genitourinary: Positive for vaginal bleeding. Negative for dysuria and vaginal discharge.  Neurological: Negative.  Negative for dizziness and headaches.   Physical Exam   Blood pressure (!) 107/57, pulse 88, temperature 98.2 F (36.8 C), temperature source Oral, resp. rate 16, height 5\' 2"  (1.575 m), weight 103 lb (46.7 kg), SpO2 100 %, unknown if  currently breastfeeding.  Physical Exam  Nursing note and vitals reviewed. Constitutional: She is oriented to person, place, and time. She appears well-developed and well-nourished. No distress.  HENT:  Head: Normocephalic.  Eyes: Pupils are equal, round, and reactive to light.  Cardiovascular: Normal rate, regular rhythm and normal heart sounds.  Respiratory: Effort normal and breath sounds normal. No respiratory distress.  GI: Soft. Bowel sounds are normal. She exhibits no distension. There is no tenderness.  Genitourinary: Vaginal discharge (small amount of brown discharge ) found.  Neurological: She is alert and oriented to person, place, and time.  Skin: Skin is warm and dry.  Psychiatric: She has a normal mood and affect. Her behavior is normal. Judgment and thought content normal.   Pelvic exam: Cervix pink, visually closed, without lesion, vaginal walls and external genitalia normal Bimanual  exam: Cervix 0/long/high, firm, anterior, neg CMT, uterus nontender, adnexa without tenderness, enlargement, or mass  FHT: 174 bpm  MAU Course  Procedures Results for orders placed or performed during the hospital encounter of 01/03/18 (from the past 24 hour(s))  Urinalysis, Routine w reflex microscopic     Status: Abnormal   Collection Time: 01/03/18  1:55 PM  Result Value Ref Range   Color, Urine YELLOW YELLOW   APPearance CLEAR CLEAR   Specific Gravity, Urine 1.018 1.005 - 1.030   pH 5.0 5.0 - 8.0   Glucose, UA NEGATIVE NEGATIVE mg/dL   Hgb urine dipstick MODERATE (A) NEGATIVE   Bilirubin Urine NEGATIVE NEGATIVE   Ketones, ur NEGATIVE NEGATIVE mg/dL   Protein, ur NEGATIVE NEGATIVE mg/dL   Nitrite NEGATIVE NEGATIVE   Leukocytes, UA NEGATIVE NEGATIVE   RBC / HPF 0-5 0 - 5 RBC/hpf   WBC, UA 0-5 0 - 5 WBC/hpf   Bacteria, UA RARE (A) NONE SEEN   Squamous Epithelial / LPF 0-5 (A) NONE SEEN   Mucus PRESENT   Wet prep, genital     Status: Abnormal   Collection Time: 01/03/18  2:15 PM  Result Value Ref Range   Yeast Wet Prep HPF POC NONE SEEN NONE SEEN   Trich, Wet Prep NONE SEEN NONE SEEN   Clue Cells Wet Prep HPF POC NONE SEEN NONE SEEN   WBC, Wet Prep HPF POC FEW (A) NONE SEEN   Sperm NONE SEEN   CBC     Status: Abnormal   Collection Time: 01/03/18  2:33 PM  Result Value Ref Range   WBC 6.3 4.0 - 10.5 K/uL   RBC 4.43 3.87 - 5.11 MIL/uL   Hemoglobin 11.6 (L) 12.0 - 15.0 g/dL   HCT 40.9 (L) 81.1 - 91.4 %   MCV 75.8 (L) 78.0 - 100.0 fL   MCH 26.2 26.0 - 34.0 pg   MCHC 34.5 30.0 - 36.0 g/dL   RDW 78.2 95.6 - 21.3 %   Platelets 186 150 - 400 K/uL    MDM UA Wet prep and gc/chlamydia CBC Reviewed with Dr. Vincente Poli- ok to discharge patient home Assessment and Plan   1. Vaginal discharge during pregnancy in first trimester   2. Presence of fetal heart sounds in first trimester    -Discharge home in stable condition -Vaginal bleeding precautions discussed -Patient advised  to follow-up with [facility] as scheduled for prenatal care -Patient may return to MAU as needed or if her condition were to change or worsen  Rolm Bookbinder CNM 01/03/2018, 2:50 PM

## 2018-01-03 NOTE — Progress Notes (Signed)
Patient states she is not having bleeding at present time, was yesterday (about 4 teaspoons).

## 2018-01-04 LAB — GC/CHLAMYDIA PROBE AMP (~~LOC~~) NOT AT ARMC
Chlamydia: NEGATIVE
Neisseria Gonorrhea: NEGATIVE

## 2018-01-06 ENCOUNTER — Telehealth: Payer: Self-pay | Admitting: *Deleted

## 2018-01-06 NOTE — Telephone Encounter (Signed)
Pt left message on 1/11 stating that she had been to the ED 2 wks ago and is having more spotting. She requested response as to what she should do.  Per chart review, pt was evaluated and treated for her concern. Return call not indicated at this time.

## 2018-01-08 ENCOUNTER — Encounter: Payer: 59 | Admitting: Certified Nurse Midwife

## 2018-01-09 ENCOUNTER — Telehealth: Payer: Self-pay

## 2018-01-09 ENCOUNTER — Inpatient Hospital Stay (HOSPITAL_COMMUNITY)
Admission: AD | Admit: 2018-01-09 | Discharge: 2018-01-09 | Disposition: A | Discharge status: Home / Self Care | Payer: 59 | Source: Ambulatory Visit | Attending: Obstetrics and Gynecology | Admitting: Obstetrics and Gynecology

## 2018-01-09 ENCOUNTER — Inpatient Hospital Stay (HOSPITAL_COMMUNITY): Payer: 59

## 2018-01-09 ENCOUNTER — Other Ambulatory Visit: Payer: Self-pay

## 2018-01-09 ENCOUNTER — Encounter (HOSPITAL_COMMUNITY): Payer: Self-pay

## 2018-01-09 DIAGNOSIS — Z3A12 12 weeks gestation of pregnancy: Secondary | ICD-10-CM | POA: Diagnosis not present

## 2018-01-09 DIAGNOSIS — O468X1 Other antepartum hemorrhage, first trimester: Secondary | ICD-10-CM

## 2018-01-09 DIAGNOSIS — O208 Other hemorrhage in early pregnancy: Secondary | ICD-10-CM | POA: Diagnosis not present

## 2018-01-09 DIAGNOSIS — O418X10 Other specified disorders of amniotic fluid and membranes, first trimester, not applicable or unspecified: Secondary | ICD-10-CM | POA: Diagnosis not present

## 2018-01-09 LAB — CBC
HEMATOCRIT: 32.8 % — AB (ref 36.0–46.0)
HEMOGLOBIN: 11.4 g/dL — AB (ref 12.0–15.0)
MCH: 26.5 pg (ref 26.0–34.0)
MCHC: 34.8 g/dL (ref 30.0–36.0)
MCV: 76.3 fL — AB (ref 78.0–100.0)
Platelets: 203 10*3/uL (ref 150–400)
RBC: 4.3 MIL/uL (ref 3.87–5.11)
RDW: 13.5 % (ref 11.5–15.5)
WBC: 6.1 10*3/uL (ref 4.0–10.5)

## 2018-01-09 LAB — URINALYSIS, ROUTINE W REFLEX MICROSCOPIC
Bacteria, UA: NONE SEEN
Bilirubin Urine: NEGATIVE
Glucose, UA: NEGATIVE mg/dL
Ketones, ur: NEGATIVE mg/dL
Leukocytes, UA: NEGATIVE
Nitrite: NEGATIVE
PH: 5 (ref 5.0–8.0)
Protein, ur: NEGATIVE mg/dL
SPECIFIC GRAVITY, URINE: 1.018 (ref 1.005–1.030)

## 2018-01-09 NOTE — MAU Note (Signed)
This is the 3rd time she has been here for bleeding, now is heavy, is soaking pads..no pain.  Does not know why she is bleeding.

## 2018-01-09 NOTE — Telephone Encounter (Signed)
Patient called stating that she was bleeding heavily, enough to use a pad since last night. Patient [facility].

## 2018-01-09 NOTE — Discharge Instructions (Signed)
Pelvic Rest °Pelvic rest may be recommended if: °· Your placenta is partially or completely covering the opening of your cervix (placenta previa). °· There is bleeding between the wall of the uterus and the amniotic sac in the first trimester of pregnancy (subchorionic hemorrhage). °· You went into labor too early (preterm labor). ° °Based on your overall health and the health of your baby, your health care provider will decide if pelvic rest is right for you. °How do I rest my pelvis? °For as long as told by your health care provider: °· Do not have sex, sexual stimulation, or an orgasm. °· Do not use tampons. Do not douche. Do not put anything in your vagina. °· Do not lift anything that is heavier than 10 lb (4.5 kg). °· Avoid activities that take a lot of effort (are strenuous). °· Avoid any activity in which your pelvic muscles could become strained. ° °When should I seek medical care? °Seek medical care if you have: °· Cramping pain in your lower abdomen. °· Vaginal discharge. °· A low, dull backache. °· Regular contractions. °· Uterine tightening. ° °When should I seek immediate medical care? °Seek immediate medical care if: °· You have vaginal bleeding and you are pregnant. ° °This information is not intended to replace advice given to you by your health care provider. Make sure you discuss any questions you have with your health care provider. °Document Released: 04/06/2011 Document Revised: 05/17/2016 Document Reviewed: 06/13/2015 °Elsevier Interactive Patient Education © 2018 [employer] ° °Subchorionic Hematoma °A subchorionic hematoma is a gathering of blood between the outer wall of the placenta and the inner wall of the womb (uterus). The placenta is the organ that connects the fetus to the wall of the uterus. The placenta performs the feeding, breathing (oxygen to the fetus), and waste removal (excretory work) of the fetus. °Subchorionic hematoma is the most common abnormality found on a result  from ultrasonography done during the first trimester or early second trimester of pregnancy. If there has been little or no vaginal bleeding, early small hematomas usually shrink on their own and do not affect your baby or pregnancy. The blood is gradually absorbed over 1-2 weeks. When bleeding starts later in pregnancy or the hematoma is larger or occurs in an older pregnant woman, the outcome may not be as good. Larger hematomas may get bigger, which increases the chances for miscarriage. Subchorionic hematoma also increases the risk of premature detachment of the placenta from the uterus, preterm (premature) labor, and stillbirth. °Follow these instructions at home: °· Stay on bed rest if your health care provider recommends this. Although bed rest will not prevent more bleeding or prevent a miscarriage, your health care provider may recommend bed rest until you are advised otherwise. °· Avoid heavy lifting (more than 10 lb [4.5 kg]), exercise, sexual intercourse, or douching as directed by your health care provider. °· Keep track of the number of pads you use each day and how soaked (saturated) they are. Write down this information. °· Do not use tampons. °· Keep all follow-up appointments as directed by your health care provider. Your health care provider may ask you to have follow-up blood tests or ultrasound tests or both. °Get help right away if: °· You have severe cramps in your stomach, back, abdomen, or pelvis. °· You have a fever. °· You pass large clots or tissue. Save any tissue for your health care provider to look at. °· Your bleeding increases or you become lightheaded,   feel weak, or have fainting episodes. °This information is not intended to replace advice given to you by your health care provider. Make sure you discuss any questions you have with your health care provider. °Document Released: 03/27/2007 Document Revised: 05/17/2016 Document Reviewed: 07/09/2013 °Elsevier Interactive Patient  Education © 2017 Elsevier Inc. ° °

## 2018-01-09 NOTE — MAU Provider Note (Signed)
History     CSN: [id number]  Arrival date and time: 01/09/18 05/18/10   First Provider Initiated Contact with Patient 01/09/18 1151     Chief Complaint  Patient presents with  . Vaginal Bleeding   HPI Kari [name] is a 22 y.o. [phone] at [redacted]w[redacted]d who presents with vaginal bleeding. She reports a big gush of bleeding that started this morning at 0900. She has changed her pad 3 times since then and the pad she put on at 11am was saturated upon arrival to MAU. She denies any pain. Last intercourse last night.   OB History    Gravida Para Term Preterm AB Living   4 2 1 1 1 1    SAB TAB Ectopic Multiple Live Births   1     0 1      Past Medical History:  Diagnosis Date  . Anemia   . Anxiety   . Depression   . Headache    migrains  . IUFD (intrauterine fetal death) 05-19-15    Past Surgical History:  Procedure Laterality Date  . APPENDECTOMY    . HERNIA REPAIR      Family History  Problem Relation Age of Onset  . Alcohol abuse Neg Hx   . Arthritis Neg Hx   . Asthma Neg Hx   . Birth defects Neg Hx   . Cancer Neg Hx   . COPD Neg Hx   . Depression Neg Hx   . Diabetes Neg Hx   . Drug abuse Neg Hx   . Early death Neg Hx   . Hearing loss Neg Hx   . Heart disease Neg Hx   . Hyperlipidemia Neg Hx   . Hypertension Neg Hx   . Kidney disease Neg Hx   . Learning disabilities Neg Hx   . Mental illness Neg Hx   . Mental retardation Neg Hx   . Miscarriages / Stillbirths Neg Hx   . Stroke Neg Hx   . Vision loss Neg Hx   . Varicose Veins Neg Hx     Social History   Tobacco Use  . Smoking status: Never Smoker  . Smokeless tobacco: Never Used  Substance Use Topics  . Alcohol use: No  . Drug use: No    Allergies: No Known Allergies  Medications Prior to Admission  Medication Sig Dispense Refill Last Dose  . Prenatal Vit-Fe Fumarate-FA (PRENATAL MULTIVITAMIN) TABS tablet Take 1 tablet by mouth daily at 12 noon.   01/02/2018 at Unknown time    Review of Systems   Constitutional: Negative.  Negative for fatigue and fever.  HENT: Negative.   Respiratory: Negative.  Negative for shortness of breath.   Cardiovascular: Negative.  Negative for chest pain.  Gastrointestinal: Negative.  Negative for abdominal pain, constipation, diarrhea, nausea and vomiting.  Genitourinary: Positive for vaginal bleeding. Negative for dysuria.  Neurological: Negative.  Negative for dizziness and headaches.   Physical Exam   Blood pressure 108/67, pulse 89, temperature 98.1 F (36.7 C), temperature source Oral, resp. rate 16, weight 104 lb (47.2 kg), SpO2 100 %, unknown if currently breastfeeding.  Physical Exam  Nursing note and vitals reviewed. Constitutional: She is oriented to person, place, and time. She appears well-developed and well-nourished. No distress.  HENT:  Head: Normocephalic.  Eyes: Pupils are equal, round, and reactive to light.  Cardiovascular: Normal rate, regular rhythm and normal heart sounds.  Respiratory: Effort normal and breath sounds normal. No respiratory distress.  GI: Soft. Bowel sounds  are normal. She exhibits no distension. There is no tenderness.  Genitourinary: Cervix exhibits friability. Cervix exhibits no motion tenderness. There is bleeding in the vagina.  Genitourinary Comments: Small amount of bright red vaginal bleeding in vault and oozing from cervix. Cervical os open  Neurological: She is alert and oriented to person, place, and time.  Skin: Skin is warm and dry.  Psychiatric: She has a normal mood and affect. Her behavior is normal. Judgment and thought content normal.   FHT: 166bpm  MAU Course  Procedures Results for orders placed or performed during the hospital encounter of 01/09/18 (from the past 24 hour(s))  Urinalysis, Routine w reflex microscopic     Status: Abnormal   Collection Time: 01/09/18 11:29 AM  Result Value Ref Range   Color, Urine YELLOW YELLOW   APPearance CLEAR CLEAR   Specific Gravity, Urine 1.018  1.005 - 1.030   pH 5.0 5.0 - 8.0   Glucose, UA NEGATIVE NEGATIVE mg/dL   Hgb urine dipstick LARGE (A) NEGATIVE   Bilirubin Urine NEGATIVE NEGATIVE   Ketones, ur NEGATIVE NEGATIVE mg/dL   Protein, ur NEGATIVE NEGATIVE mg/dL   Nitrite NEGATIVE NEGATIVE   Leukocytes, UA NEGATIVE NEGATIVE   RBC / HPF TOO NUMEROUS TO COUNT 0 - 5 RBC/hpf   WBC, UA 6-30 0 - 5 WBC/hpf   Bacteria, UA NONE SEEN NONE SEEN   Squamous Epithelial / LPF 0-5 (A) NONE SEEN   Mucus PRESENT   CBC     Status: Abnormal   Collection Time: 01/09/18 12:11 PM  Result Value Ref Range   WBC 6.1 4.0 - 10.5 K/uL   RBC 4.30 3.87 - 5.11 MIL/uL   Hemoglobin 11.4 (L) 12.0 - 15.0 g/dL   HCT 82.932.8 (L) 56.236.0 - 13.046.0 %   MCV 76.3 (L) 78.0 - 100.0 fL   MCH 26.5 26.0 - 34.0 pg   MCHC 34.8 30.0 - 36.0 g/dL   RDW 86.513.5 78.411.5 - 69.615.5 %   Platelets 203 150 - 400 K/uL   [place] Ob Transvaginal  Result Date: 01/09/2018 CLINICAL DATA:  Pregnant patient with vaginal bleeding. EXAM: TRANSVAGINAL OB ULTRASOUND TECHNIQUE: Transvaginal ultrasound was performed for complete evaluation of the gestation as well as the maternal uterus, adnexal regions, and pelvic cul-de-sac. COMPARISON:  Pelvic ultrasound 12/24/2017 FINDINGS: Intrauterine gestational sac: Single Yolk sac:  Not Visualized. Embryo:  Visualized. Cardiac Activity: Visualized. Heart Rate: 177 bpm CRL:   63 mm   12 w 5 d                  US EDC: 07/19/2018 Subchorionic hemorrhage:  Large subchorionic hemorrhage Maternal uterus/adnexae: Normal right and left ovaries. No free fluid in the pelvis. IMPRESSION: Large subchorionic hemorrhage. Single live intrauterine gestation. Electronically Signed   By: Annia Beltrew  Davis M.D.   On: 01/09/2018 13:28   MDM US OB Transvaginal- large subchorionic hemorrhage CBC  Assessment and Plan   1. Subchorionic hemorrhage of placenta in first trimester, single or unspecified fetus   2. [redacted] weeks gestation of pregnancy    -Discharge home in stable condition -Vaginal  bleeding precautions discussed -[facility] -Patient may return to MAU as needed or if her condition were to change or worsen  Rolm BookbinderCaroline M Candis Kabel CNM 01/09/2018, 1:32 PM

## 2018-01-16 ENCOUNTER — Ambulatory Visit (INDEPENDENT_AMBULATORY_CARE_PROVIDER_SITE_OTHER): Payer: 59 | Admitting: Certified Nurse Midwife

## 2018-01-16 ENCOUNTER — Encounter: Payer: Self-pay | Admitting: Certified Nurse Midwife

## 2018-01-16 ENCOUNTER — Other Ambulatory Visit (HOSPITAL_COMMUNITY)
Admission: RE | Admit: 2018-01-16 | Discharge: 2018-01-16 | Disposition: A | Discharge status: Home / Self Care | Payer: 59 | Source: Ambulatory Visit | Attending: Certified Nurse Midwife | Admitting: Certified Nurse Midwife

## 2018-01-16 VITALS — BP 100/67 | HR 88 | Wt 105.0 lb

## 2018-01-16 DIAGNOSIS — Z3482 Encounter for supervision of other normal pregnancy, second trimester: Secondary | ICD-10-CM | POA: Diagnosis not present

## 2018-01-16 DIAGNOSIS — Z3A13 13 weeks gestation of pregnancy: Secondary | ICD-10-CM | POA: Insufficient documentation

## 2018-01-16 DIAGNOSIS — O0990 Supervision of high risk pregnancy, unspecified, unspecified trimester: Secondary | ICD-10-CM | POA: Insufficient documentation

## 2018-01-16 DIAGNOSIS — Z3480 Encounter for supervision of other normal pregnancy, unspecified trimester: Secondary | ICD-10-CM

## 2018-01-16 DIAGNOSIS — Z8759 Personal history of other complications of pregnancy, childbirth and the puerperium: Secondary | ICD-10-CM

## 2018-01-16 DIAGNOSIS — O09212 Supervision of pregnancy with history of pre-term labor, second trimester: Secondary | ICD-10-CM

## 2018-01-16 MED ORDER — PRENATE PIXIE 10-0.6-0.4-200 MG PO CAPS: 1.0000 | ORAL_CAPSULE | Freq: Every day | ORAL | 12 refills | Status: DC

## 2018-01-16 NOTE — Progress Notes (Signed)
Pt has been seen at MAU on 3 occasions for vaginal bleeding. Declines Flu vaccine.  Declines Genetic Screening.

## 2018-01-16 NOTE — Progress Notes (Signed)
Subjective:   Kari [name] is a 22 y.o. [id number] at [redacted]w[redacted]d by early ultrasound being seen today for her first obstetrical visit.  Her obstetrical history is significant for hx of IUFD @30  wks, history preterm labor last pregnancy with delivery at term. Patient does intend to breast feed. Pregnancy history fully reviewed.  Patient reports no contractions, no cramping, no leaking and vaginal bleeding since start of pregnancy, pelvic rest restrictions given.[name]  HISTORY: Obstetric History   G4   P2   T1   P1   A1   L1    SAB1   TAB0   Ectopic0   Multiple0   Live Births1     # Outcome Date GA Lbr Len/2nd Weight Sex Delivery Anes PTL Lv  4 Current           3 Term 02/27/17 [redacted]w[redacted]d 06:44 / 00:12 7 lb 7 oz (3.374 kg) M Vag-Spont EPI  LIV     Name: Kari [name]     Apgar1:  9                Apgar5: 9  2 SAB 01/2016          1 Preterm 10/08/15 [redacted]w[redacted]d 03:20 / 00:02 3 lb 0.9 oz (1.385 kg) M Vag-Spont EPI  FD     Name: Kari [name]     Apgar1:  0                Apgar5: 0     Past Medical History:  Diagnosis Date  . Anemia   . Anxiety   . Depression   . Headache    migrains  . IUFD (intrauterine fetal death) May 07, 2015   Past Surgical History:  Procedure Laterality Date  . APPENDECTOMY    . HERNIA REPAIR     Family History  Problem Relation Age of Onset  . Alcohol abuse Neg Hx   . Arthritis Neg Hx   . Asthma Neg Hx   . Birth defects Neg Hx   . Cancer Neg Hx   . COPD Neg Hx   . Depression Neg Hx   . Diabetes Neg Hx   . Drug abuse Neg Hx   . Early death Neg Hx   . Hearing loss Neg Hx   . Heart disease Neg Hx   . Hyperlipidemia Neg Hx   . Hypertension Neg Hx   . Kidney disease Neg Hx   . Learning disabilities Neg Hx   . Mental illness Neg Hx   . Mental retardation Neg Hx   . Miscarriages / Stillbirths Neg Hx   . Stroke Neg Hx   . Vision loss Neg Hx   . Varicose Veins Neg Hx    Social History   Tobacco Use  . Smoking status: Never Smoker  . Smokeless  tobacco: Never Used  Substance Use Topics  . Alcohol use: No  . Drug use: No   No Known Allergies Current Outpatient Medications on File Prior to Visit  Medication Sig Dispense Refill  . Prenatal Vit-Fe Fumarate-FA (PRENATAL MULTIVITAMIN) TABS tablet Take 1 tablet by mouth daily at 12 noon.     No current facility-administered medications on file prior to visit.      Exam   Vitals:   01/16/18 1007  BP: 100/67  Pulse: 88  Weight: 105 lb (47.6 kg)      Uterus:     Pelvic Exam: Perineum: no hemorrhoids, normal perineum   Vulva: normal external  genitalia, no lesions   Vagina:  normal mucosa, normal discharge   Cervix: no lesions and normal, pap smear done. CL shortened on digital exam.  OS closed.  Brown/red bleeding noted from OS before and after pap smear.    Adnexa: normal adnexa and no mass, fullness, tenderness   Bony Pelvis: average  System: General: well-developed, well-nourished female in no acute distress   Breast:  normal appearance, no masses or tenderness   Skin: normal coloration and turgor, no rashes   Neurologic: oriented, normal, negative, normal mood   Extremities: normal strength, tone, and muscle mass, ROM of all joints is normal   HEENT PERRLA, extraocular movement intact and sclera clear, anicteric   Mouth/Teeth mucous membranes moist, pharynx normal without lesions and dental hygiene good   Neck supple and no masses   Cardiovascular: regular rate and rhythm   Respiratory:  no respiratory distress, normal breath sounds   Abdomen: soft, non-tender; bowel sounds normal; no masses,  no organomegaly     Assessment:   Pregnancy: [id number] Patient Active Problem List   Diagnosis Date Noted  . Supervision of other normal pregnancy, antepartum 01/16/2018  . Ovarian cyst during pregnancy 07/18/2016  . History of IUFD 10/07/2015     Plan:  1. Supervision of other normal pregnancy, antepartum    - US MFM OB DETAIL +14 WK; Future - Hemoglobinopathy  evaluation - Hemoglobin A1c - Obstetric Panel, Including HIV - Cystic Fibrosis Mutation 97 - Cervicovaginal ancillary only - Vitamin D (25 hydroxy) - Culture, OB Urine - Prenat-FeAsp-Meth-FA-DHA w/o A (PRENATE PIXIE) 10-0.6-0.4-200 MG CAPS; Take 1 tablet by mouth daily.  Dispense: 30 capsule; Refill: 12  2. History of IUFD   - US MFM OB DETAIL +14 WK; Future - US MFM OB Transvaginal; Future  3. Current pregnancy with history of preterm labor, second trimester      - US MFM OB Transvaginal; Future   Initial labs drawn. Continue prenatal vitamins. Genetic Screening discussed, NIPS: declined. Ultrasound discussed; fetal anatomic survey: ordered. Problem list reviewed and updated. The nature of [facility] with multiple MDs and other Advanced Practice Providers was explained to patient; also emphasized that residents, students are part of our team. Routine obstetric precautions reviewed. Return in about 4 weeks (around 02/13/2018) for ROB, Needs to see FP MD here: may need cerclage?[place].     Manasseh Pittsley, CNM [facility], [facility]

## 2018-01-16 NOTE — Addendum Note (Signed)
Addended by: Tim LairLARK, Jamarie Mussa on: 01/16/2018 03:43 PM   Modules accepted: Orders

## 2018-01-17 LAB — CYTOLOGY - PAP: Diagnosis: NEGATIVE

## 2018-01-17 LAB — CERVICOVAGINAL ANCILLARY ONLY
BACTERIAL VAGINITIS: NEGATIVE
CHLAMYDIA, DNA PROBE: NEGATIVE
Candida vaginitis: NEGATIVE
NEISSERIA GONORRHEA: NEGATIVE
Trichomonas: NEGATIVE

## 2018-01-19 LAB — URINE CULTURE, OB REFLEX

## 2018-01-19 LAB — CULTURE, OB URINE

## 2018-01-20 ENCOUNTER — Other Ambulatory Visit: Payer: Self-pay | Admitting: Certified Nurse Midwife

## 2018-01-20 DIAGNOSIS — O2342 Unspecified infection of urinary tract in pregnancy, second trimester: Secondary | ICD-10-CM

## 2018-01-20 DIAGNOSIS — O2340 Unspecified infection of urinary tract in pregnancy, unspecified trimester: Secondary | ICD-10-CM | POA: Insufficient documentation

## 2018-01-20 MED ORDER — CLINDAMYCIN HCL 300 MG PO CAPS: 300.0000 mg | ORAL_CAPSULE | Freq: Three times a day (TID) | ORAL | 0 refills | Status: DC

## 2018-01-21 LAB — OBSTETRIC PANEL, INCLUDING HIV
ANTIBODY SCREEN: NEGATIVE
BASOS: 0 %
Basophils Absolute: 0 10*3/uL (ref 0.0–0.2)
EOS (ABSOLUTE): 0.1 10*3/uL (ref 0.0–0.4)
Eos: 1 %
HEMOGLOBIN: 11.9 g/dL (ref 11.1–15.9)
HIV Screen 4th Generation wRfx: NONREACTIVE
Hematocrit: 36.8 % (ref 34.0–46.6)
Hepatitis B Surface Ag: NEGATIVE
IMMATURE GRANS (ABS): 0 10*3/uL (ref 0.0–0.1)
Immature Granulocytes: 0 %
LYMPHS: 21 %
Lymphocytes Absolute: 1.2 10*3/uL (ref 0.7–3.1)
MCH: 26.3 pg — AB (ref 26.6–33.0)
MCHC: 32.3 g/dL (ref 31.5–35.7)
MCV: 81 fL (ref 79–97)
MONOS ABS: 0.4 10*3/uL (ref 0.1–0.9)
Monocytes: 7 %
Neutrophils Absolute: 3.9 10*3/uL (ref 1.4–7.0)
Neutrophils: 71 %
Platelets: 198 10*3/uL (ref 150–379)
RBC: 4.52 x10E6/uL (ref 3.77–5.28)
RDW: 15 % (ref 12.3–15.4)
RPR: NONREACTIVE
Rh Factor: POSITIVE
Rubella Antibodies, IGG: 1.74 index (ref >0.99)
WBC: 5.5 10*3/uL (ref 3.4–10.8)

## 2018-01-21 LAB — HEMOGLOBINOPATHY EVALUATION
HEMOGLOBIN F QUANTITATION: 0 % (ref 0.0–2.0)
HGB C: 0 %
HGB S: 34.3 % — AB
HGB VARIANT: 0 %
Hemoglobin A2 Quantitation: 4.2 % — ABNORMAL HIGH (ref 1.8–3.2)
Hgb A: 61.5 % — ABNORMAL LOW (ref 96.4–98.8)

## 2018-01-21 LAB — HEMOGLOBIN A1C
Est. average glucose Bld gHb Est-mCnc: 85 mg/dL
HEMOGLOBIN A1C: 4.6 % — AB (ref 4.8–5.6)

## 2018-01-21 LAB — VITAMIN D 25 HYDROXY (VIT D DEFICIENCY, FRACTURES): Vit D, 25-Hydroxy: 28.5 ng/mL — ABNORMAL LOW (ref 30.0–100.0)

## 2018-01-22 ENCOUNTER — Other Ambulatory Visit: Payer: Self-pay | Admitting: Certified Nurse Midwife

## 2018-01-22 DIAGNOSIS — E559 Vitamin D deficiency, unspecified: Secondary | ICD-10-CM

## 2018-01-22 DIAGNOSIS — Z3480 Encounter for supervision of other normal pregnancy, unspecified trimester: Secondary | ICD-10-CM

## 2018-01-22 MED ORDER — VITAMIN D (ERGOCALCIFEROL) 1.25 MG (50000 UNIT) PO CAPS: 50000.0000 [IU] | ORAL_CAPSULE | ORAL | 2 refills | Status: DC

## 2018-01-23 ENCOUNTER — Other Ambulatory Visit: Payer: Self-pay | Admitting: Certified Nurse Midwife

## 2018-01-23 DIAGNOSIS — Z3480 Encounter for supervision of other normal pregnancy, unspecified trimester: Secondary | ICD-10-CM

## 2018-01-23 LAB — CYSTIC FIBROSIS MUTATION 97: GENE DIS ANAL CARRIER INTERP BLD/T-IMP: NOT DETECTED

## 2018-01-24 ENCOUNTER — Inpatient Hospital Stay (HOSPITAL_COMMUNITY)
Admission: AD | Admit: 2018-01-24 | Discharge: 2018-01-24 | Disposition: A | Discharge status: Home / Self Care | Payer: 59 | Source: Ambulatory Visit | Attending: Obstetrics and Gynecology | Admitting: Obstetrics and Gynecology

## 2018-01-24 ENCOUNTER — Encounter (HOSPITAL_COMMUNITY): Payer: Self-pay

## 2018-01-24 ENCOUNTER — Other Ambulatory Visit: Payer: Self-pay

## 2018-01-24 DIAGNOSIS — J069 Acute upper respiratory infection, unspecified: Secondary | ICD-10-CM | POA: Diagnosis not present

## 2018-01-24 DIAGNOSIS — R05 Cough: Secondary | ICD-10-CM | POA: Diagnosis present

## 2018-01-24 DIAGNOSIS — Z3A14 14 weeks gestation of pregnancy: Secondary | ICD-10-CM | POA: Diagnosis not present

## 2018-01-24 DIAGNOSIS — O26893 Other specified pregnancy related conditions, third trimester: Secondary | ICD-10-CM | POA: Diagnosis not present

## 2018-01-24 DIAGNOSIS — O99512 Diseases of the respiratory system complicating pregnancy, second trimester: Secondary | ICD-10-CM | POA: Insufficient documentation

## 2018-01-24 DIAGNOSIS — G43009 Migraine without aura, not intractable, without status migrainosus: Secondary | ICD-10-CM | POA: Diagnosis not present

## 2018-01-24 LAB — URINALYSIS, ROUTINE W REFLEX MICROSCOPIC
BILIRUBIN URINE: NEGATIVE
GLUCOSE, UA: NEGATIVE mg/dL
KETONES UR: NEGATIVE mg/dL
LEUKOCYTES UA: NEGATIVE
Nitrite: NEGATIVE
PH: 8 (ref 5.0–8.0)
Protein, ur: NEGATIVE mg/dL
Specific Gravity, Urine: 1.012 (ref 1.005–1.030)

## 2018-01-24 LAB — INFLUENZA PANEL BY PCR (TYPE A & B)
Influenza A By PCR: NEGATIVE
Influenza B By PCR: NEGATIVE

## 2018-01-24 LAB — GROUP B STREP BY PCR: Group B strep by PCR: NEGATIVE

## 2018-01-24 MED ORDER — LACTATED RINGERS IV BOLUS (SEPSIS)
1000.0000 mL | Freq: Once | INTRAVENOUS | Status: AC
  Administered 2018-01-24: 1000 mL via INTRAVENOUS

## 2018-01-24 MED ORDER — METOCLOPRAMIDE HCL 5 MG/ML IJ SOLN
10.0000 mg | Freq: Once | INTRAMUSCULAR | Status: AC
  Administered 2018-01-24: 10 mg via INTRAVENOUS
  Filled 2018-01-24: qty 2

## 2018-01-24 MED ORDER — DEXAMETHASONE SODIUM PHOSPHATE 10 MG/ML IJ SOLN
10.0000 mg | Freq: Once | INTRAMUSCULAR | Status: AC
  Administered 2018-01-24: 10 mg via INTRAVENOUS
  Filled 2018-01-24: qty 1

## 2018-01-24 MED ORDER — DIPHENHYDRAMINE HCL 50 MG/ML IJ SOLN
25.0000 mg | Freq: Once | INTRAMUSCULAR | Status: AC
  Administered 2018-01-24: 25 mg via INTRAVENOUS
  Filled 2018-01-24: qty 1

## 2018-01-24 NOTE — MAU Provider Note (Signed)
Chief Complaint: Headache; Chills; and sorethroat   First Provider Initiated Contact with Patient 01/24/18 1357      SUBJECTIVE HPI: Kari [name] is a 22 y.o. [phone] at 8167w3d by LMP who presents to maternity admissions reporting cough x 2 weeks, sore throat x 2 days, migraine h/a x 2 days and chills x 24 hours. She has known exposure to flu since her boyfriend tested positive yesterday. Her cough is nonproductive and occasional, less severe now than when it started.  Her sore throat is mild, likely associated with drainage per the pt.  She has intermittent congestion and rhinorrhea.  She has hx of migraines and feels like this is similar with frontal pressure and pain that is constant and unchanged since onset.  It is not associated with light sensitivity. She has not tried any treatments. There are no other associated symptoms.  She denies abdominal pain, vaginal bleeding, vaginal itching/burning, urinary symptoms, h/a, dizziness, or n/v.    HPI  Past Medical History:  Diagnosis Date  . Anemia   . Anxiety   . Depression   . Headache    migrains  . IUFD (intrauterine fetal death) 2016   Past Surgical History:  Procedure Laterality Date  . APPENDECTOMY    . HERNIA REPAIR     Social History   Socioeconomic History  . Marital status: Single    Spouse name: Not on file  . Number of children: Not on file  . Years of education: Not on file  . Highest education level: Not on file  Social Needs  . Financial resource strain: Not on file  . Food insecurity - worry: Not on file  . Food insecurity - inability: Not on file  . Transportation needs - medical: Not on file  . Transportation needs - non-medical: Not on file  Occupational History  . Not on file  Tobacco Use  . Smoking status: Never Smoker  . Smokeless tobacco: Never Used  Substance and Sexual Activity  . Alcohol use: No  . Drug use: No  . Sexual activity: Not Currently    Birth control/protection: None     Comment: last intercourse Nov 17  Other Topics Concern  . Not on file  Social History Narrative  . Not on file   No current facility-administered medications on file prior to encounter.    Current Outpatient Medications on File Prior to Encounter  Medication Sig Dispense Refill  . clindamycin (CLEOCIN) 300 MG capsule Take 1 capsule (300 mg total) by mouth 3 (three) times daily. 21 capsule 0  . Prenat-FeAsp-Meth-FA-DHA w/o A (PRENATE PIXIE) 10-0.6-0.4-200 MG CAPS Take 1 tablet by mouth daily. 30 capsule 12  . Prenatal Vit-Fe Fumarate-FA (PRENATAL MULTIVITAMIN) TABS tablet Take 1 tablet by mouth daily at 12 noon.    . Vitamin D, Ergocalciferol, (DRISDOL) 50000 units CAPS capsule Take 1 capsule (50,000 Units total) by mouth every 7 (seven) days. 30 capsule 2   No Known Allergies  ROS:  Review of Systems  Constitutional: Positive for chills. Negative for fatigue and fever.  HENT: Positive for congestion, rhinorrhea and sore throat.   Eyes: Negative for photophobia.  Respiratory: Negative for shortness of breath.   Cardiovascular: Negative for chest pain.  Gastrointestinal: Negative for abdominal pain, nausea and vomiting.  Genitourinary: Negative for difficulty urinating, dysuria, flank pain, pelvic pain, vaginal bleeding, vaginal discharge and vaginal pain.  Neurological: Negative for dizziness and headaches.  Psychiatric/Behavioral: Negative.      I have reviewed patient's Past  Medical Hx, Surgical Hx, Family Hx, Social Hx, medications and allergies.   Physical Exam   Patient Vitals for the past 24 hrs:  BP Temp Temp src Pulse Resp SpO2 Weight  01/24/18 1507 104/63 - - - - - -  01/24/18 1308 102/60 99.1 F (37.3 C) Oral 90 16 100 % 106 lb 12 oz (48.4 kg)   Constitutional: Well-developed, well-nourished female in no acute distress.  Cardiovascular: normal rate Respiratory: normal effort GI: Abd soft, non-tender. Pos BS x 4 MS: Extremities nontender, no edema, normal  ROM Neurologic: Alert and oriented x 4.  GU: Neg CVAT.  FHT 169 by doppler  LAB RESULTS Results for orders placed or performed during the hospital encounter of 01/24/18 (from the past 24 hour(s))  Urinalysis, Routine w reflex microscopic     Status: Abnormal   Collection Time: 01/24/18  1:10 PM  Result Value Ref Range   Color, Urine YELLOW YELLOW   APPearance CLOUDY (A) CLEAR   Specific Gravity, Urine 1.012 1.005 - 1.030   pH 8.0 5.0 - 8.0   Glucose, UA NEGATIVE NEGATIVE mg/dL   Hgb urine dipstick MODERATE (A) NEGATIVE   Bilirubin Urine NEGATIVE NEGATIVE   Ketones, ur NEGATIVE NEGATIVE mg/dL   Protein, ur NEGATIVE NEGATIVE mg/dL   Nitrite NEGATIVE NEGATIVE   Leukocytes, UA NEGATIVE NEGATIVE   RBC / HPF 0-5 0 - 5 RBC/hpf   WBC, UA 6-30 0 - 5 WBC/hpf   Bacteria, UA RARE (A) NONE SEEN   Squamous Epithelial / LPF 0-5 (A) NONE SEEN   Mucus PRESENT   Influenza panel by PCR (type A & B)     Status: None   Collection Time: 01/24/18  2:01 PM  Result Value Ref Range   Influenza A By PCR NEGATIVE NEGATIVE   Influenza B By PCR NEGATIVE NEGATIVE  Group B strep by PCR     Status: None   Collection Time: 01/24/18  2:01 PM  Result Value Ref Range   Group B strep by PCR NEGATIVE NEGATIVE    B/Positive/-- (01/24 1038)  IMAGING   MAU Management/MDM: Pt with low-grade fever and likely migraine h/a triggered by upper respiratory illness.  Headache cocktail with LR x 1000 ml, Reglan 10 mg IV, Benadryl 25 mg, Decadron 10 mg IV.  Pt with improvement in h/a.  Influenza swab negative.  Treat with safe OTC cold medications, pt given list of safe meds in pregnancy.  F/U in office with prenatal care as scheduled, return to MAU as needed for emergencies.  Pt discharged with strict infection/second trimester precautions.  ASSESSMENT 1. Upper respiratory infection, viral   2. Migraine without aura and without status migrainosus, not intractable     PLAN Discharge home  Allergies as of 01/24/2018    No Known Allergies     Medication List    TAKE these medications   clindamycin 300 MG capsule Commonly known as:  CLEOCIN Take 1 capsule (300 mg total) by mouth 3 (three) times daily.   prenatal multivitamin Tabs tablet Take 1 tablet by mouth daily at 12 noon.   PRENATE PIXIE 10-0.6-0.4-200 MG Caps Take 1 tablet by mouth daily.   Vitamin D (Ergocalciferol) 50000 units Caps capsule Commonly known as:  DRISDOL Take 1 capsule (50,000 Units total) by mouth every 7 (seven) days.      Follow-up Information    [facility] Follow up.   Why:  As scheduled, return to MAU as needed for emergencies Contact information: [address]  [address] [place] [address] [phone]          Sharen Counter Certified Nurse-Midwife 01/24/2018  3:53 PM

## 2018-01-24 NOTE — MAU Note (Addendum)
Has a really bad migraines (yesterday).  Has been getting the chills and her throat is sore(started 2 days ago). Has had a cough for 2 wks. Boyfriend tested + for flu, she did not get flu shot

## 2018-02-10 ENCOUNTER — Encounter (HOSPITAL_COMMUNITY): Payer: Self-pay

## 2018-02-10 ENCOUNTER — Ambulatory Visit (HOSPITAL_COMMUNITY)
Admission: RE | Admit: 2018-02-10 | Discharge: 2018-02-10 | Disposition: A | Discharge status: Home / Self Care | Payer: 59 | Source: Ambulatory Visit | Attending: Certified Nurse Midwife | Admitting: Certified Nurse Midwife

## 2018-02-10 ENCOUNTER — Other Ambulatory Visit: Payer: Self-pay | Admitting: Certified Nurse Midwife

## 2018-02-10 DIAGNOSIS — Z3686 Encounter for antenatal screening for cervical length: Secondary | ICD-10-CM | POA: Diagnosis not present

## 2018-02-10 DIAGNOSIS — O09212 Supervision of pregnancy with history of pre-term labor, second trimester: Secondary | ICD-10-CM | POA: Diagnosis not present

## 2018-02-10 DIAGNOSIS — Z3A16 16 weeks gestation of pregnancy: Secondary | ICD-10-CM

## 2018-02-10 DIAGNOSIS — O208 Other hemorrhage in early pregnancy: Secondary | ICD-10-CM | POA: Diagnosis not present

## 2018-02-10 DIAGNOSIS — O418X20 Other specified disorders of amniotic fluid and membranes, second trimester, not applicable or unspecified: Secondary | ICD-10-CM | POA: Diagnosis not present

## 2018-02-10 DIAGNOSIS — O468X2 Other antepartum hemorrhage, second trimester: Secondary | ICD-10-CM | POA: Diagnosis not present

## 2018-02-10 DIAGNOSIS — O09292 Supervision of pregnancy with other poor reproductive or obstetric history, second trimester: Secondary | ICD-10-CM | POA: Diagnosis not present

## 2018-02-10 DIAGNOSIS — Z8759 Personal history of other complications of pregnancy, childbirth and the puerperium: Secondary | ICD-10-CM

## 2018-02-13 ENCOUNTER — Ambulatory Visit (INDEPENDENT_AMBULATORY_CARE_PROVIDER_SITE_OTHER): Payer: 59 | Admitting: Obstetrics and Gynecology

## 2018-02-13 ENCOUNTER — Encounter: Payer: Self-pay | Admitting: Obstetrics and Gynecology

## 2018-02-13 VITALS — BP 110/65 | HR 98 | Wt 111.1 lb

## 2018-02-13 DIAGNOSIS — Z8759 Personal history of other complications of pregnancy, childbirth and the puerperium: Secondary | ICD-10-CM

## 2018-02-13 DIAGNOSIS — Z3480 Encounter for supervision of other normal pregnancy, unspecified trimester: Secondary | ICD-10-CM

## 2018-02-13 NOTE — Progress Notes (Signed)
   PRENATAL VISIT NOTE  Subjective:  Margreta JourneyCheyenne C Gilson is a 22 y.o. [id number] at 174w2d being seen today for ongoing prenatal care.  She is currently monitored for the following issues for this high-risk pregnancy and has History of IUFD; Ovarian cyst during pregnancy; Supervision of other normal pregnancy, antepartum; UTI (urinary tract infection) during pregnancy; and Vitamin D deficiency on their problem list.  Patient reports no complaints.  Contractions: Not present. Vag. Bleeding: None.  Movement: Present. Denies leaking of fluid.   The following portions of the patient's history were reviewed and updated as appropriate: allergies, current medications, past family history, past medical history, past social history, past surgical history and problem list. Problem list updated.  Objective:   Vitals:   02/13/18 1003 02/13/18 1013  BP: 110/65   Pulse: (!) 123 98  Weight: 111 lb 1.6 oz (50.4 kg)     Fetal Status: Fetal Heart Rate (bpm): 178   Movement: Present     General:  Alert, oriented and cooperative. Patient is in no acute distress.  Skin: Skin is warm and dry. No rash noted.   Cardiovascular: Normal heart rate noted  Respiratory: Normal respiratory effort, no problems with respiration noted  Abdomen: Soft, gravid, appropriate for gestational age.  Pain/Pressure: Absent     Pelvic: Cervical exam deferred        Extremities: Normal range of motion.  Edema: None  Mental Status:  Normal mood and affect. Normal behavior. Normal judgment and thought content.   Assessment and Plan:  Pregnancy: [id number] at 594w2d  1. Supervision of other normal pregnancy, antepartum Patient is doing well without complaints Follow up anatomy ultrasound and cervical length on 3/4 Last cervical length was 3.3 cm on 2/18  2. History of IUFD Antenatal testing starting at 28 weeks  Preterm labor symptoms and general obstetric precautions including but not limited to vaginal bleeding, contractions,  leaking of fluid and fetal movement were reviewed in detail with the patient. Please refer to After Visit Summary for other counseling recommendations.  Return in about 4 weeks (around 03/13/2018) for ROB.   Catalina AntiguaPeggy Naleah Kofoed, MD

## 2018-02-13 NOTE — Progress Notes (Signed)
Pt denies concerns at this time. 

## 2018-02-23 ENCOUNTER — Encounter (HOSPITAL_BASED_OUTPATIENT_CLINIC_OR_DEPARTMENT_OTHER): Payer: Self-pay | Admitting: Emergency Medicine

## 2018-02-23 ENCOUNTER — Other Ambulatory Visit: Payer: Self-pay

## 2018-02-23 ENCOUNTER — Emergency Department (HOSPITAL_BASED_OUTPATIENT_CLINIC_OR_DEPARTMENT_OTHER)
Admission: EM | Admit: 2018-02-23 | Discharge: 2018-02-23 | Disposition: A | Discharge status: Home / Self Care | Payer: 59 | Attending: Emergency Medicine | Admitting: Emergency Medicine

## 2018-02-23 DIAGNOSIS — O26892 Other specified pregnancy related conditions, second trimester: Secondary | ICD-10-CM | POA: Diagnosis not present

## 2018-02-23 DIAGNOSIS — Z3A20 20 weeks gestation of pregnancy: Secondary | ICD-10-CM | POA: Insufficient documentation

## 2018-02-23 DIAGNOSIS — L03011 Cellulitis of right finger: Secondary | ICD-10-CM | POA: Diagnosis not present

## 2018-02-23 DIAGNOSIS — Z79899 Other long term (current) drug therapy: Secondary | ICD-10-CM | POA: Diagnosis not present

## 2018-02-23 MED ORDER — CEPHALEXIN 500 MG PO CAPS: 500.0000 mg | ORAL_CAPSULE | Freq: Four times a day (QID) | ORAL | 0 refills | Status: DC

## 2018-02-23 MED ORDER — TETANUS-DIPHTH-ACELL PERTUSSIS 5-2.5-18.5 LF-MCG/0.5 IM SUSP
0.5000 mL | Freq: Once | INTRAMUSCULAR | Status: AC
  Administered 2018-02-23: 0.5 mL via INTRAMUSCULAR
  Filled 2018-02-23: qty 0.5

## 2018-02-23 MED ORDER — LIDOCAINE HCL (PF) 1 % IJ SOLN
10.0000 mL | Freq: Once | INTRAMUSCULAR | Status: AC
Start: 2018-02-23 — End: 2018-02-23
  Administered 2018-02-23: 10 mL
  Filled 2018-02-23: qty 10

## 2018-02-23 NOTE — ED Provider Notes (Signed)
[facility] EMERGENCY DEPARTMENT Provider Note   CSN: [id number] Arrival date & time: 02/23/18  1138     History   Chief Complaint Chief Complaint  Patient presents with  . Finger pain    HPI Kari [name] is a 22 y.o. female.  Kari [name] is a 22 y.o. Female history of anxiety, depression, anemia, currently 5 months pregnant, presents to the ED for evaluation of right middle finger pain and swelling for the past 4 days.  Patient reports pain and swelling surrounding the nailbed of the right middle finger for the past 4 days over 2 days she has noted a little green area along the side of her nail bed.  Patient denies any pain on the palmar surface of the finger, swelling does not extend below the DIP joint.  Patient able to move the finger without difficulty.  She denies fevers or chills, no nausea or vomiting.  Denies hangnails, or recent manicures.  No meds prior to arrival, no other aggravating or alleviating factors.      Past Medical History:  Diagnosis Date  . Anemia   . Anxiety   . Depression   . Headache    migrains  . IUFD (intrauterine fetal death) 2015-05-31    Patient Active Problem List   Diagnosis Date Noted  . Vitamin D deficiency 01/22/2018  . UTI (urinary tract infection) during pregnancy 01/20/2018  . Supervision of other normal pregnancy, antepartum 01/16/2018  . Ovarian cyst during pregnancy 07/18/2016  . History of IUFD 10/07/2015    Past Surgical History:  Procedure Laterality Date  . APPENDECTOMY    . HERNIA REPAIR      OB History    Gravida Para Term Preterm AB Living   4 2 1 1 1 1    SAB TAB Ectopic Multiple Live Births   1     0 1       Home Medications    Prior to Admission medications   Medication Sig Start Date End Date Taking? Authorizing Provider  cephALEXin (KEFLEX) 500 MG capsule Take 1 capsule (500 mg total) by mouth 4 (four) times daily. 02/23/18   Dartha Lodge, PA-C  Prenat-FeAsp-Meth-FA-DHA w/o A  (PRENATE PIXIE) 10-0.6-0.4-200 MG CAPS Take 1 tablet by mouth daily. 01/16/18   Roe Coombs, CNM  Prenatal Vit-Fe Fumarate-FA (PRENATAL MULTIVITAMIN) TABS tablet Take 1 tablet by mouth daily at 12 noon.    [provider]  Vitamin D, Ergocalciferol, (DRISDOL) 50000 units CAPS capsule Take 1 capsule (50,000 Units total) by mouth every 7 (seven) days. Patient not taking: Reported on 02/10/2018 01/22/18   Roe Coombs, CNM    Family History Family History  Problem Relation Age of Onset  . Alcohol abuse Neg Hx   . Arthritis Neg Hx   . Asthma Neg Hx   . Birth defects Neg Hx   . Cancer Neg Hx   . COPD Neg Hx   . Depression Neg Hx   . Diabetes Neg Hx   . Drug abuse Neg Hx   . Early death Neg Hx   . Hearing loss Neg Hx   . Heart disease Neg Hx   . Hyperlipidemia Neg Hx   . Hypertension Neg Hx   . Kidney disease Neg Hx   . Learning disabilities Neg Hx   . Mental illness Neg Hx   . Mental retardation Neg Hx   . Miscarriages / Stillbirths Neg Hx   . Stroke Neg Hx   .  Vision loss Neg Hx   . Varicose Veins Neg Hx     Social History Social History   Tobacco Use  . Smoking status: Never Smoker  . Smokeless tobacco: Never Used  Substance Use Topics  . Alcohol use: No  . Drug use: No     Allergies   Patient has no known allergies.   Review of Systems Review of Systems  Constitutional: Negative for chills and fever.  Musculoskeletal: Positive for joint swelling (Right middle finger).  Skin: Positive for color change.  Neurological: Negative for weakness and numbness.     Physical Exam Updated Vital Signs BP 106/74 (BP Location: Left Arm)   Pulse 80   Temp 99 F (37.2 C) (Oral)   Resp 18   Ht 5\' 2"  (1.575 m)   Wt 51.1 kg (112 lb 10.5 oz)   LMP 10/16/2017 (LMP Unknown)   SpO2 100%   BMI 20.60 kg/m   Physical Exam  Constitutional: She appears well-developed and well-nourished. No distress.  HENT:  Head: Normocephalic and atraumatic.  Eyes:  Right eye exhibits no discharge. Left eye exhibits no discharge.  Pulmonary/Chest: Effort normal. No respiratory distress.  Musculoskeletal:  Right middle finger with redness and swelling surrounding the nailbed with small green pocket of pus on the left side of the nailbed, mild surrounding erythema, no erythema, tenderness or swelling on the palmar aspect of the digit, normal range of motion of the finger at all joints, swelling does not extend past DIP joint  Neurological: She is alert. Coordination normal.  Skin: Skin is warm and dry. Capillary refill takes less than 2 seconds. She is not diaphoretic.  Psychiatric: She has a normal mood and affect. Her behavior is normal.  Nursing note and vitals reviewed.    ED Treatments / Results  Labs (all labs ordered are listed, but only abnormal results are displayed) Labs Reviewed - No data to display  EKG  EKG Interpretation None       Radiology No results found.  Procedures Procedures (including critical care time)  Medications Ordered in ED Medications  lidocaine (PF) (XYLOCAINE) 1 % injection 10 mL (10 mLs Infiltration Given 02/23/18 1256)  Tdap (BOOSTRIX) injection 0.5 mL (0.5 mLs Intramuscular Given 02/23/18 1254)     Initial Impression / Assessment and Plan / ED Course  I have reviewed the triage vital signs and the nursing notes.  Pertinent labs & imaging results that were available during my care of the patient were reviewed by me and considered in my medical decision making (see chart for details).  Presents to the ED for evaluation of pain and swelling to the right middle finger for the past few days, pain is located around the nailbed with small abscess present on the left side of the nailbed.  There is no erythema swelling or tenderness noted on the palmar surface of the finger to suggest felon, swelling and redness does not extend past the DIP joint, no concern for flexor tenosynovitis, exam consistent with paronychia.   No fevers or chills, patient is well-appearing and not systemically ill.  Tetanus updated here in the ED plan to perform incision and drainage of paronychia.  Anesthesia achieved with digital block, incision and drainage of paronychia with large amount of purulent drainage, improvement in pain after drainage.  Will place patient on Keflex, encourage warm soaks, patient to keep area clean dry and covered.  Return precautions discussed.  Patient to follow-up with her primary care doctor.  Final Clinical Impressions(s) /  ED Diagnoses   Final diagnoses:  Paronychia of finger of right hand    ED Discharge Orders        Ordered    cephALEXin (KEFLEX) 500 MG capsule  4 times daily     02/23/18 [address]13       Amarisa Wilinski [place], [place]-C 02/23/18 1745    Rolan BuccoBelfi, Melanie, MD 02/24/18 [phone]

## 2018-02-23 NOTE — Discharge Instructions (Signed)
You have a paronychia, please read the information provided, this usually improves on its own after drainage, please soak finger in warm clean water a couple times a day, please also take entire course of Keflex is provided you may use Tylenol for pain. Keep area clean and dry, cover with bandage when working. If you note worsening swelling or redness especially if there is pain or tenderness on her side of the finger please return to the ED for reevaluation otherwise follow-up with your primary care doctor as needed.

## 2018-02-23 NOTE — ED Triage Notes (Addendum)
R middle finger pain and swelling x 2 days. Denies injury. Pt is also 5 months pregnant.

## 2018-02-24 ENCOUNTER — Encounter (HOSPITAL_COMMUNITY): Payer: Self-pay

## 2018-02-24 ENCOUNTER — Other Ambulatory Visit: Payer: Self-pay | Admitting: Certified Nurse Midwife

## 2018-02-24 ENCOUNTER — Ambulatory Visit (HOSPITAL_COMMUNITY)
Admission: RE | Admit: 2018-02-24 | Discharge: 2018-02-24 | Disposition: A | Discharge status: Home / Self Care | Payer: 59 | Source: Ambulatory Visit | Attending: Certified Nurse Midwife | Admitting: Certified Nurse Midwife

## 2018-02-24 ENCOUNTER — Other Ambulatory Visit (HOSPITAL_COMMUNITY): Payer: Self-pay | Admitting: *Deleted

## 2018-02-24 DIAGNOSIS — Z3A18 18 weeks gestation of pregnancy: Secondary | ICD-10-CM

## 2018-02-24 DIAGNOSIS — Z3480 Encounter for supervision of other normal pregnancy, unspecified trimester: Secondary | ICD-10-CM

## 2018-02-24 DIAGNOSIS — Z3686 Encounter for antenatal screening for cervical length: Secondary | ICD-10-CM | POA: Insufficient documentation

## 2018-02-24 DIAGNOSIS — O09292 Supervision of pregnancy with other poor reproductive or obstetric history, second trimester: Secondary | ICD-10-CM | POA: Insufficient documentation

## 2018-02-24 DIAGNOSIS — Z362 Encounter for other antenatal screening follow-up: Secondary | ICD-10-CM

## 2018-02-24 DIAGNOSIS — Z8759 Personal history of other complications of pregnancy, childbirth and the puerperium: Secondary | ICD-10-CM

## 2018-02-24 DIAGNOSIS — O0990 Supervision of high risk pregnancy, unspecified, unspecified trimester: Secondary | ICD-10-CM

## 2018-02-24 DIAGNOSIS — Z363 Encounter for antenatal screening for malformations: Secondary | ICD-10-CM | POA: Diagnosis not present

## 2018-03-03 ENCOUNTER — Encounter (HOSPITAL_COMMUNITY): Payer: Self-pay | Admitting: *Deleted

## 2018-03-03 ENCOUNTER — Inpatient Hospital Stay (HOSPITAL_COMMUNITY)
Admission: AD | Admit: 2018-03-03 | Discharge: 2018-03-03 | Disposition: A | Discharge status: Home / Self Care | Payer: 59 | Source: Ambulatory Visit | Attending: Obstetrics & Gynecology | Admitting: Obstetrics & Gynecology

## 2018-03-03 DIAGNOSIS — O219 Vomiting of pregnancy, unspecified: Secondary | ICD-10-CM | POA: Diagnosis not present

## 2018-03-03 DIAGNOSIS — F419 Anxiety disorder, unspecified: Secondary | ICD-10-CM | POA: Insufficient documentation

## 2018-03-03 DIAGNOSIS — Z3A19 19 weeks gestation of pregnancy: Secondary | ICD-10-CM | POA: Insufficient documentation

## 2018-03-03 DIAGNOSIS — O99342 Other mental disorders complicating pregnancy, second trimester: Secondary | ICD-10-CM | POA: Insufficient documentation

## 2018-03-03 DIAGNOSIS — F329 Major depressive disorder, single episode, unspecified: Secondary | ICD-10-CM | POA: Diagnosis not present

## 2018-03-03 DIAGNOSIS — R112 Nausea with vomiting, unspecified: Secondary | ICD-10-CM | POA: Diagnosis present

## 2018-03-03 LAB — URINALYSIS, ROUTINE W REFLEX MICROSCOPIC
Bacteria, UA: NONE SEEN
Bilirubin Urine: NEGATIVE
GLUCOSE, UA: 50 mg/dL — AB
HGB URINE DIPSTICK: NEGATIVE
Ketones, ur: 20 mg/dL — AB
Leukocytes, UA: NEGATIVE
NITRITE: NEGATIVE
PH: 5 (ref 5.0–8.0)
Protein, ur: 30 mg/dL — AB
Specific Gravity, Urine: 1.019 (ref 1.005–1.030)

## 2018-03-03 MED ORDER — SODIUM CHLORIDE 0.9 % IV SOLN
25.0000 mg | Freq: Once | INTRAVENOUS | Status: AC
  Administered 2018-03-03: 25 mg via INTRAVENOUS
  Filled 2018-03-03: qty 1

## 2018-03-03 MED ORDER — METOCLOPRAMIDE HCL 10 MG PO TABS: 10.0000 mg | ORAL_TABLET | Freq: Four times a day (QID) | ORAL | 0 refills | Status: DC

## 2018-03-03 MED ORDER — PROMETHAZINE HCL 25 MG RE SUPP: 25.0000 mg | Freq: Four times a day (QID) | RECTAL | 0 refills | Status: DC | PRN

## 2018-03-03 MED ORDER — RANITIDINE HCL 150 MG PO TABS: 150.0000 mg | ORAL_TABLET | Freq: Two times a day (BID) | ORAL | 0 refills | Status: DC

## 2018-03-03 MED ORDER — SODIUM CHLORIDE 0.9 % IV SOLN
Freq: Once | INTRAVENOUS | Status: AC
  Administered 2018-03-03: 12:00:00 via INTRAVENOUS
  Filled 2018-03-03: qty 1000

## 2018-03-03 MED ORDER — FAMOTIDINE IN NACL 20-0.9 MG/50ML-% IV SOLN
20.0000 mg | Freq: Once | INTRAVENOUS | Status: AC
  Administered 2018-03-03: 20 mg via INTRAVENOUS
  Filled 2018-03-03: qty 50

## 2018-03-03 NOTE — MAU Provider Note (Signed)
History     CSN: [id number]  Arrival date and time: 03/03/18 0930   First Provider Initiated Contact with Patient 03/03/18 1008      Chief Complaint  Patient presents with  . Emesis  . Nausea  . Abdominal Pain   HPI  Ms.  Kari [name] is a 22 y.o. year old [id number] female at [redacted]w[redacted]d weeks gestation who presents to MAU reporting N/V since 2300 last night. She reports 1 episode of loose stool yesterday morning. She also complains of lower abdominal pain since 0400 today. She denies any fever or further diarrhea. She reports this is a new problem.  Past Medical History:  Diagnosis Date  . Anemia   . Anxiety   . Depression   . Headache    migrains  . IUFD (intrauterine fetal death) 05-16-15    Past Surgical History:  Procedure Laterality Date  . APPENDECTOMY    . HERNIA REPAIR      Family History  Problem Relation Age of Onset  . Alcohol abuse Neg Hx   . Arthritis Neg Hx   . Asthma Neg Hx   . Birth defects Neg Hx   . Cancer Neg Hx   . COPD Neg Hx   . Depression Neg Hx   . Diabetes Neg Hx   . Drug abuse Neg Hx   . Early death Neg Hx   . Hearing loss Neg Hx   . Heart disease Neg Hx   . Hyperlipidemia Neg Hx   . Hypertension Neg Hx   . Kidney disease Neg Hx   . Learning disabilities Neg Hx   . Mental illness Neg Hx   . Mental retardation Neg Hx   . Miscarriages / Stillbirths Neg Hx   . Stroke Neg Hx   . Vision loss Neg Hx   . Varicose Veins Neg Hx     Social History   Tobacco Use  . Smoking status: Never Smoker  . Smokeless tobacco: Never Used  Substance Use Topics  . Alcohol use: No  . Drug use: No    Allergies: No Known Allergies  Medications Prior to Admission  Medication Sig Dispense Refill Last Dose  . cephALEXin (KEFLEX) 500 MG capsule Take 1 capsule (500 mg total) by mouth 4 (four) times daily. 20 capsule 0 Taking  . Prenat-FeAsp-Meth-FA-DHA w/o A (PRENATE PIXIE) 10-0.6-0.4-200 MG CAPS Take 1 tablet by mouth daily. 30 capsule 12 Taking  .  Prenatal Vit-Fe Fumarate-FA (PRENATAL MULTIVITAMIN) TABS tablet Take 1 tablet by mouth daily at 12 noon.   Taking  . Vitamin D, Ergocalciferol, (DRISDOL) 50000 units CAPS capsule Take 1 capsule (50,000 Units total) by mouth every 7 (seven) days. 30 capsule 2 Taking    Review of Systems  Constitutional: Negative.   HENT: Negative.   Eyes: Negative.   Respiratory: Negative.   Cardiovascular: Negative.   Gastrointestinal: Positive for abdominal pain (lower since 0400), nausea and vomiting. Negative for diarrhea (1 episode yesterday morning).  Endocrine: Negative.   Genitourinary: Negative.   Musculoskeletal: Negative.   Skin: Negative.   Allergic/Immunologic: Negative.   Neurological: Negative.   Hematological: Negative.   Psychiatric/Behavioral: Negative.    Physical Exam   Blood pressure 105/68, pulse (!) 106, temperature 98.2 F (36.8 C), temperature source Oral, resp. rate 15, height 5\' 2"  (1.575 m), weight 111 lb (50.3 kg), last menstrual period 10/16/2017, SpO2 100 %.  Physical Exam  Nursing note and vitals reviewed. Constitutional: She is oriented to person, place, and time.  She appears well-developed and well-nourished.  HENT:  Head: Normocephalic and atraumatic.  Eyes: Pupils are equal, round, and reactive to light.  Neck: Normal range of motion.  Cardiovascular: Normal rate, regular rhythm and normal heart sounds.  Respiratory: Effort normal and breath sounds normal.  GI: Soft.  Musculoskeletal: Normal range of motion.  Neurological: She is alert and oriented to person, place, and time.  Skin: Skin is warm and dry.  Psychiatric: She has a normal mood and affect. Her behavior is normal. Judgment and thought content normal.    MAU Course  Procedures  MDM CCUA FHTs by doppler: 171 bpm IVFs: Phenergan 25 mg in 0.9% NS 1000 ml @ bolus rate, then MVI in 0.9% NS 1000 ml @ 500 ml/hr  Results for orders placed or performed during the hospital encounter of 03/03/18 (from  the past 24 hour(s))  Urinalysis, Routine w reflex microscopic     Status: Abnormal   Collection Time: 03/03/18  9:30 AM  Result Value Ref Range   Color, Urine YELLOW YELLOW   APPearance CLEAR CLEAR   Specific Gravity, Urine 1.019 1.005 - 1.030   pH 5.0 5.0 - 8.0   Glucose, UA 50 (A) NEGATIVE mg/dL   Hgb urine dipstick NEGATIVE NEGATIVE   Bilirubin Urine NEGATIVE NEGATIVE   Ketones, ur 20 (A) NEGATIVE mg/dL   Protein, ur 30 (A) NEGATIVE mg/dL   Nitrite NEGATIVE NEGATIVE   Leukocytes, UA NEGATIVE NEGATIVE   RBC / HPF 0-5 0 - 5 RBC/hpf   WBC, UA 0-5 0 - 5 WBC/hpf   Bacteria, UA NONE SEEN NONE SEEN   Squamous Epithelial / LPF 0-5 (A) NONE SEEN   Mucus PRESENT      Assessment and Plan  Nausea/vomiting in pregnancy - Plan: Discharge patient - Rx for Phenergan 25 mg suppositories, insert vaginally every 6 hrs prn N/V -- when Reglan is not working - Rx for Reglan 10 mg every 6 hrs prn N/V - Information provided on HG in pregnancy - Discharge home - Keep scheduled OB appt with [facility] on 3/20 - Patient verbalized an understanding of the plan of care and agrees.    Raelyn Moraolitta Elward Nocera, MSN, CNM 03/03/2018, 10:08 AM

## 2018-03-03 NOTE — MAU Note (Signed)
Pt reports nausea/vomiting since 11 pm last night. Denies fever or diarrhea ( one loose stool yesterday am). Lower abd pain since 4 am.

## 2018-03-13 ENCOUNTER — Encounter: Payer: Self-pay | Admitting: Obstetrics & Gynecology

## 2018-03-13 ENCOUNTER — Ambulatory Visit (INDEPENDENT_AMBULATORY_CARE_PROVIDER_SITE_OTHER): Payer: 59 | Admitting: Obstetrics & Gynecology

## 2018-03-13 VITALS — BP 102/69 | HR 97 | Wt 116.6 lb

## 2018-03-13 DIAGNOSIS — Z8759 Personal history of other complications of pregnancy, childbirth and the puerperium: Secondary | ICD-10-CM

## 2018-03-13 DIAGNOSIS — O0990 Supervision of high risk pregnancy, unspecified, unspecified trimester: Secondary | ICD-10-CM

## 2018-03-13 NOTE — Patient Instructions (Signed)
Second Trimester of Pregnancy The second trimester is from week 13 through week 28, month 4 through 6. This is often the time in pregnancy that you feel your best. Often times, morning sickness has lessened or quit. You may have more energy, and you may get hungry more often. Your unborn baby (fetus) is growing rapidly. At the end of the sixth month, he or she is about 9 inches long and weighs about 1 pounds. You will likely feel the baby move (quickening) between 18 and 20 weeks of pregnancy. Follow these instructions at home:  Avoid all smoking, herbs, and alcohol. Avoid drugs not approved by your doctor.  Do not use any tobacco products, including cigarettes, chewing tobacco, and electronic cigarettes. If you need help quitting, ask your doctor. You may get counseling or other support to help you quit.  Only take medicine as told by your doctor. Some medicines are safe and some are not during pregnancy.  Exercise only as told by your doctor. Stop exercising if you start having cramps.  Eat regular, healthy meals.  Wear a good support bra if your breasts are tender.  Do not use hot tubs, steam rooms, or saunas.  Wear your seat belt when driving.  Avoid raw meat, uncooked cheese, and liter boxes and soil used by cats.  Take your prenatal vitamins.  Take 1500-2000 milligrams of calcium daily starting at the 20th week of pregnancy until you deliver your baby.  Try taking medicine that helps you poop (stool softener) as needed, and if your doctor approves. Eat more fiber by eating fresh fruit, vegetables, and whole grains. Drink enough fluids to keep your pee (urine) clear or pale yellow.  Take warm water baths (sitz baths) to soothe pain or discomfort caused by hemorrhoids. Use hemorrhoid cream if your doctor approves.  If you have puffy, bulging veins (varicose veins), wear support hose. Raise (elevate) your feet for 15 minutes, 3-4 times a day. Limit salt in your diet.  Avoid heavy  lifting, wear low heals, and sit up straight.  Rest with your legs raised if you have leg cramps or low back pain.  Visit your dentist if you have not gone during your pregnancy. Use a soft toothbrush to brush your teeth. Be gentle when you floss.  You can have sex (intercourse) unless your doctor tells you not to.  Go to your doctor visits. Get help if:  You feel dizzy.  You have mild cramps or pressure in your lower belly (abdomen).  You have a nagging pain in your belly area.  You continue to feel sick to your stomach (nauseous), throw up (vomit), or have watery poop (diarrhea).  You have bad smelling fluid coming from your vagina.  You have pain with peeing (urination). Get help right away if:  You have a fever.  You are leaking fluid from your vagina.  You have spotting or bleeding from your vagina.  You have severe belly cramping or pain.  You lose or gain weight rapidly.  You have trouble catching your breath and have chest pain.  You notice sudden or extreme puffiness (swelling) of your face, hands, ankles, feet, or legs.  You have not felt the baby move in over an hour.  You have severe headaches that do not go away with medicine.  You have vision changes. This information is not intended to replace advice given to you by your health care provider. Make sure you discuss any questions you have with your health care   provider. Document Released: 03/06/2010 Document Revised: 05/17/2016 Document Reviewed: 02/10/2013 Elsevier Interactive Patient Education  2017 [employer]  

## 2018-03-13 NOTE — Progress Notes (Signed)
   PRENATAL VISIT NOTE  Subjective:  Kari [name] is a 22 y.o. [phone] at [redacted]w[redacted]d being seen today for ongoing prenatal care.  She is currently monitored for the following issues for this high-risk pregnancy and has History of IUFD; Ovarian cyst during pregnancy; Supervision of high risk pregnancy, antepartum; UTI (urinary tract infection) during pregnancy; Vitamin D deficiency; and Nausea/vomiting in pregnancy on their problem list.  Patient reports no complaints.  Contractions: Irregular. Vag. Bleeding: None.  Movement: Present. Denies leaking of fluid.   The following portions of the patient's history were reviewed and updated as appropriate: allergies, current medications, past family history, past medical history, past social history, past surgical history and problem list. Problem list updated.  Objective:   Vitals:   03/13/18 0937  BP: 102/69  Pulse: 97  Weight: 116 lb 9.6 oz (52.9 kg)    Fetal Status: Fetal Heart Rate (bpm): 160   Movement: Present     General:  Alert, oriented and cooperative. Patient is in no acute distress.  Skin: Skin is warm and dry. No rash noted.   Cardiovascular: Normal heart rate noted  Respiratory: Normal respiratory effort, no problems with respiration noted  Abdomen: Soft, gravid, appropriate for gestational age.  Pain/Pressure: Absent     Pelvic: Cervical exam deferred        Extremities: Normal range of motion.  Edema: None  Mental Status:  Normal mood and affect. Normal behavior. Normal judgment and thought content.   Assessment and Plan:  Pregnancy: [id number] at [redacted]w[redacted]d  1. Supervision of high risk pregnancy, antepartum [place] reviewed, cx length was normal  2. History of IUFD Start fetal testing 28 weeks  Preterm labor symptoms and general obstetric precautions including but not limited to vaginal bleeding, contractions, leaking of fluid and fetal movement were reviewed in detail with the patient. Please refer to After Visit Summary for  other counseling recommendations.  Return in about 1 month (around 04/10/2018).   Scheryl Darter, MD

## 2018-03-24 ENCOUNTER — Encounter (HOSPITAL_COMMUNITY): Payer: Self-pay

## 2018-03-24 ENCOUNTER — Other Ambulatory Visit: Payer: Self-pay | Admitting: Certified Nurse Midwife

## 2018-03-24 ENCOUNTER — Other Ambulatory Visit (HOSPITAL_COMMUNITY): Payer: Self-pay | Admitting: *Deleted

## 2018-03-24 ENCOUNTER — Other Ambulatory Visit (HOSPITAL_COMMUNITY): Payer: Self-pay | Admitting: Obstetrics and Gynecology

## 2018-03-24 ENCOUNTER — Ambulatory Visit (HOSPITAL_COMMUNITY)
Admission: RE | Admit: 2018-03-24 | Discharge: 2018-03-24 | Disposition: A | Discharge status: Home / Self Care | Payer: 59 | Source: Ambulatory Visit | Attending: Certified Nurse Midwife | Admitting: Certified Nurse Midwife

## 2018-03-24 DIAGNOSIS — O09299 Supervision of pregnancy with other poor reproductive or obstetric history, unspecified trimester: Secondary | ICD-10-CM

## 2018-03-24 DIAGNOSIS — Z3A22 22 weeks gestation of pregnancy: Secondary | ICD-10-CM | POA: Insufficient documentation

## 2018-03-24 DIAGNOSIS — Z362 Encounter for other antenatal screening follow-up: Secondary | ICD-10-CM | POA: Insufficient documentation

## 2018-03-24 DIAGNOSIS — O09292 Supervision of pregnancy with other poor reproductive or obstetric history, second trimester: Secondary | ICD-10-CM

## 2018-03-24 DIAGNOSIS — O0990 Supervision of high risk pregnancy, unspecified, unspecified trimester: Secondary | ICD-10-CM

## 2018-03-24 DIAGNOSIS — Z3686 Encounter for antenatal screening for cervical length: Secondary | ICD-10-CM

## 2018-03-24 NOTE — Addendum Note (Signed)
Encounter addended by: Lenise ArenaBazemore, Shepherd Finnan, RDMS on: 03/24/2018 10:49 AM  Actions taken: Imaging Exam ended

## 2018-03-31 ENCOUNTER — Other Ambulatory Visit: Payer: Self-pay | Admitting: Certified Nurse Midwife

## 2018-03-31 DIAGNOSIS — O0990 Supervision of high risk pregnancy, unspecified, unspecified trimester: Secondary | ICD-10-CM

## 2018-03-31 DIAGNOSIS — Z8759 Personal history of other complications of pregnancy, childbirth and the puerperium: Secondary | ICD-10-CM

## 2018-04-10 ENCOUNTER — Ambulatory Visit (INDEPENDENT_AMBULATORY_CARE_PROVIDER_SITE_OTHER): Payer: 59 | Admitting: Obstetrics and Gynecology

## 2018-04-10 ENCOUNTER — Encounter: Payer: Self-pay | Admitting: Obstetrics and Gynecology

## 2018-04-10 VITALS — BP 99/62 | HR 61 | Wt 118.4 lb

## 2018-04-10 DIAGNOSIS — Z8759 Personal history of other complications of pregnancy, childbirth and the puerperium: Secondary | ICD-10-CM

## 2018-04-10 DIAGNOSIS — O0992 Supervision of high risk pregnancy, unspecified, second trimester: Secondary | ICD-10-CM

## 2018-04-10 DIAGNOSIS — O0990 Supervision of high risk pregnancy, unspecified, unspecified trimester: Secondary | ICD-10-CM

## 2018-04-10 NOTE — Patient Instructions (Signed)
Etonogestrel implant What is this medicine? ETONOGESTREL (et oh noe JES trel) is a contraceptive (birth control) device. It is used to prevent pregnancy. It can be used for up to 3 years. This medicine may be used for other purposes; ask your health care provider or pharmacist if you have questions. COMMON BRAND NAME(S): Implanon, Nexplanon What should I tell my health care provider before I take this medicine? They need to know if you have any of these conditions: -abnormal vaginal bleeding -blood vessel disease or blood clots -cancer of the breast, cervix, or liver -depression -diabetes -gallbladder disease -headaches -heart disease or recent heart attack -high blood pressure -high cholesterol -kidney disease -liver disease -renal disease -seizures -tobacco smoker -an unusual or allergic reaction to etonogestrel, other hormones, anesthetics or antiseptics, medicines, foods, dyes, or preservatives -pregnant or trying to get pregnant -breast-feeding How should I use this medicine? This device is inserted just under the skin on the inner side of your upper arm by a health care professional. Talk to your pediatrician regarding the use of this medicine in children. Special care may be needed. Overdosage: If you think you have taken too much of this medicine contact a poison control center or emergency room at once. NOTE: This medicine is only for you. Do not share this medicine with others. What if I miss a dose? This does not apply. What may interact with this medicine? Do not take this medicine with any of the following medications: -amprenavir -bosentan -fosamprenavir This medicine may also interact with the following medications: -barbiturate medicines for inducing sleep or treating seizures -certain medicines for fungal infections like ketoconazole and itraconazole -grapefruit juice -griseofulvin -medicines to treat seizures like carbamazepine, felbamate, oxcarbazepine,  phenytoin, topiramate -modafinil -phenylbutazone -rifampin -rufinamide -some medicines to treat HIV infection like atazanavir, indinavir, lopinavir, nelfinavir, tipranavir, ritonavir -St. John's wort This list may not describe all possible interactions. Give your health care provider a list of all the medicines, herbs, non-prescription drugs, or dietary supplements you use. Also tell them if you smoke, drink alcohol, or use illegal drugs. Some items may interact with your medicine. What should I watch for while using this medicine? This product does not protect you against HIV infection (AIDS) or other sexually transmitted diseases. You should be able to feel the implant by pressing your fingertips over the skin where it was inserted. Contact your doctor if you cannot feel the implant, and use a non-hormonal birth control method (such as condoms) until your doctor confirms that the implant is in place. If you feel that the implant may have broken or become bent while in your arm, contact your healthcare provider. What side effects may I notice from receiving this medicine? Side effects that you should report to your doctor or health care professional as soon as possible: -allergic reactions like skin rash, itching or hives, swelling of the face, lips, or tongue -breast lumps -changes in emotions or moods -depressed mood -heavy or prolonged menstrual bleeding -pain, irritation, swelling, or bruising at the insertion site -scar at site of insertion -signs of infection at the insertion site such as fever, and skin redness, pain or discharge -signs of pregnancy -signs and symptoms of a blood clot such as breathing problems; changes in vision; chest pain; severe, sudden headache; pain, swelling, warmth in the leg; trouble speaking; sudden numbness or weakness of the face, arm or leg -signs and symptoms of liver injury like dark yellow or brown urine; general ill feeling or flu-like symptoms;  light-colored   stools; loss of appetite; nausea; right upper belly pain; unusually weak or tired; yellowing of the eyes or skin -unusual vaginal bleeding, discharge -signs and symptoms of a stroke like changes in vision; confusion; trouble speaking or understanding; severe headaches; sudden numbness or weakness of the face, arm or leg; trouble walking; dizziness; loss of balance or coordination Side effects that usually do not require medical attention (report to your doctor or health care professional if they continue or are bothersome): -acne -back pain -breast pain -changes in weight -dizziness -general ill feeling or flu-like symptoms -headache -irregular menstrual bleeding -nausea -sore throat -vaginal irritation or inflammation This list may not describe all possible side effects. Call your doctor for medical advice about side effects. You may report side effects to FDA at 1-800-FDA-1088. Where should I keep my medicine? This drug is given in a hospital or clinic and will not be stored at home. NOTE: This sheet is a summary. It may not cover all possible information. If you have questions about this medicine, talk to your doctor, pharmacist, or health care provider.  2018 Elsevier/Gold Standard (2016-06-28 11:19:22) Ethinyl Estradiol; Etonogestrel vaginal ring What is this medicine? ETHINYL ESTRADIOL; ETONOGESTREL (ETH in il es tra DYE ole; et oh noe JES trel) vaginal ring is a flexible, vaginal ring used as a contraceptive (birth control method). This medicine combines two types of female hormones, an estrogen and a progestin. This ring is used to prevent ovulation and pregnancy. Each ring is effective for one month. This medicine may be used for other purposes; ask your health care provider or pharmacist if you have questions. COMMON BRAND NAME(S): NuvaRing What should I tell my health care provider before I take this medicine? They need to know if you have or ever had any of these  conditions: -abnormal vaginal bleeding -blood vessel disease or blood clots -breast, cervical, endometrial, ovarian, liver, or uterine cancer -diabetes -gallbladder disease -heart disease or recent heart attack -high blood pressure -high cholesterol -kidney disease -liver disease -migraine headaches -stroke -systemic lupus erythematosus (SLE) -tobacco smoker -an unusual or allergic reaction to estrogens, progestins, other medicines, foods, dyes, or preservatives -pregnant or trying to get pregnant -breast-feeding How should I use this medicine? Insert the ring into your vagina as directed. Follow the directions on the prescription label. The ring will remain place for 3 weeks and is then removed for a 1-week break. A new ring is inserted 1 week after the last ring was removed, on the same day of the week. Check often to make sure the ring is still in place, especially before and after sexual intercourse. If the ring was out of the vagina for an unknown amount of time, you may not be protected from pregnancy. Perform a pregnancy test and call your doctor. Do not use more often than directed. A patient package insert for the product will be given with each prescription and refill. Read this sheet carefully each time. The sheet may change frequently. Contact your pediatrician regarding the use of this medicine in children. Special care may be needed. This medicine has been used in female children who have started having menstrual periods. Overdosage: If you think you have taken too much of this medicine contact a poison control center or emergency room at once. NOTE: This medicine is only for you. Do not share this medicine with others. What if I miss a dose? You will need to replace your vaginal ring once a month as directed. If the ring should slip  out, or if you leave it in longer or shorter than you should, contact your health care professional for advice. What may interact with this  medicine? Do not take this medicine with the following medication: -dasabuvir; ombitasvir; paritaprevir; ritonavir -ombitasvir; paritaprevir; ritonavir This medicine may also interact with the following medications: -acetaminophen -antibiotics or medicines for infections, especially rifampin, rifabutin, rifapentine, and griseofulvin, and possibly penicillins or tetracyclines -aprepitant -ascorbic acid (vitamin C) -atorvastatin -barbiturate medicines, such as phenobarbital -bosentan -carbamazepine -caffeine -clofibrate -cyclosporine -dantrolene -doxercalciferol -felbamate -grapefruit juice -hydrocortisone -medicines for anxiety or sleeping problems, such as diazepam or temazepam -medicines for diabetes, including pioglitazone -modafinil -mycophenolate -nefazodone -oxcarbazepine -phenytoin -prednisolone -ritonavir or other medicines for HIV infection or AIDS -rosuvastatin -selegiline -soy isoflavones supplements -St. John's wort -tamoxifen or raloxifene -theophylline -thyroid hormones -topiramate -warfarin This list may not describe all possible interactions. Give your health care provider a list of all the medicines, herbs, non-prescription drugs, or dietary supplements you use. Also tell them if you smoke, drink alcohol, or use illegal drugs. Some items may interact with your medicine. What should I watch for while using this medicine? Visit your doctor or health care professional for regular checks on your progress. You will need a regular breast and pelvic exam and Pap smear while on this medicine. Use an additional method of contraception during the first cycle that you use this ring. Do not use a diaphragm or female condom, as the ring can interfere with these birth control methods and their proper placement. If you have any reason to think you are pregnant, stop using this medicine right away and contact your doctor or health care professional. If you are using this  medicine for hormone related problems, it may take several cycles of use to see improvement in your condition. Smoking increases the risk of getting a blood clot or having a stroke while you are using hormonal birth control, especially if you are more than 21 years old. You are strongly advised not to smoke. This medicine can make your body retain fluid, making your fingers, hands, or ankles swell. Your blood pressure can go up. Contact your doctor or health care professional if you feel you are retaining fluid. This medicine can make you more sensitive to the sun. Keep out of the sun. If you cannot avoid being in the sun, wear protective clothing and use sunscreen. Do not use sun lamps or tanning beds/booths. If you wear contact lenses and notice visual changes, or if the lenses begin to feel uncomfortable, consult your eye care specialist. In some women, tenderness, swelling, or minor bleeding of the gums may occur. Notify your dentist if this happens. Brushing and flossing your teeth regularly may help limit this. See your dentist regularly and inform your dentist of the medicines you are taking. If you are going to have elective surgery, you may need to stop using this medicine before the surgery. Consult your health care professional for advice. This medicine does not protect you against HIV infection (AIDS) or any other sexually transmitted diseases. What side effects may I notice from receiving this medicine? Side effects that you should report to your doctor or health care professional as soon as possible: -breast tissue changes or discharge -changes in vaginal bleeding during your period or between your periods -chest pain -coughing up blood -dizziness or fainting spells -headaches or migraines -leg, arm or groin pain -severe or sudden headaches -stomach pain (severe) -sudden shortness of breath -sudden loss of coordination, especially on one  side of the body -speech problems -symptoms  of vaginal infection like itching, irritation or unusual discharge -tenderness in the upper abdomen -vomiting -weakness or numbness in the arms or legs, especially on one side of the body -yellowing of the eyes or skin Side effects that usually do not require medical attention (report to your doctor or health care professional if they continue or are bothersome): -breakthrough bleeding and spotting that continues beyond the 3 initial cycles of pills -breast tenderness -mood changes, anxiety, depression, frustration, anger, or emotional outbursts -increased sensitivity to sun or ultraviolet light -nausea -skin rash, acne, or brown spots on the skin -weight gain (slight) This list may not describe all possible side effects. Call your doctor for medical advice about side effects. You may report side effects to FDA at 1-800-FDA-1088. Where should I keep my medicine? Keep out of the reach of children. Store at room temperature between 15 and 30 degrees C (59 and 86 degrees F) for up to 4 months. The product will expire after 4 months. Protect from light. Throw away any unused medicine after the expiration date. NOTE: This sheet is a summary. It may not cover all possible information. If you have questions about this medicine, talk to your doctor, pharmacist, or health care provider.  2018 Elsevier/Gold Standard (2016-08-17 17:00:31) Intrauterine Device Information An intrauterine device (IUD) is inserted into your uterus to prevent pregnancy. There are two types of IUDs available:  Copper IUD-This type of IUD is wrapped in copper wire and is placed inside the uterus. Copper makes the uterus and fallopian tubes produce a fluid that kills sperm. The copper IUD can stay in place for 10 years.  Hormone IUD-This type of IUD contains the hormone progestin (synthetic progesterone). The hormone thickens the cervical mucus and prevents sperm from entering the uterus. It also thins the uterine lining to  prevent implantation of a fertilized egg. The hormone can weaken or kill the sperm that get into the uterus. One type of hormone IUD can stay in place for 5 years, and another type can stay in place for 3 years.  Your health care provider will make sure you are a good candidate for a contraceptive IUD. Discuss with your health care provider the possible side effects. Advantages of an intrauterine device  IUDs are highly effective, reversible, long acting, and low maintenance.  There are no estrogen-related side effects.  An IUD can be used when breastfeeding.  IUDs are not associated with weight gain.  The copper IUD works immediately after insertion.  The hormone IUD works right away if inserted within 7 days of your period starting. You will need to use a backup method of birth control for 7 days if the hormone IUD is inserted at any other time in your cycle.  The copper IUD does not interfere with your female hormones.  The hormone IUD can make heavy menstrual periods lighter and decrease cramping.  The hormone IUD can be used for 3 or 5 years.  The copper IUD can be used for 10 years. Disadvantages of an intrauterine device  The hormone IUD can be associated with irregular bleeding patterns.  The copper IUD can make your menstrual flow heavier and more painful.  You may experience cramping and vaginal bleeding after insertion. This information is not intended to replace advice given to you by your health care provider. Make sure you discuss any questions you have with your health care provider. Document Released: 11/13/2004 Document Revised: 05/17/2016 Document Reviewed:  05/31/2013 Elsevier Interactive Patient Education  2017 [employer].

## 2018-04-10 NOTE — Progress Notes (Signed)
Patient reports fetal movement and a little bit of back pain, denies contractions.

## 2018-04-10 NOTE — Progress Notes (Signed)
   PRENATAL VISIT NOTE  Subjective:  Kari [name] is a 22 y.o. [phone] at 639w2d being seen today for ongoing prenatal care.  She is currently monitored for the following issues for this high-risk pregnancy and has History of IUFD; Ovarian cyst during pregnancy; Supervision of high risk pregnancy, antepartum; UTI (urinary tract infection) during pregnancy; Vitamin D deficiency; and Nausea/vomiting in pregnancy on their problem list.  Patient reports occasional cramping.  Contractions: Not present. Vag. Bleeding: None.  Movement: Present. Denies leaking of fluid.   The following portions of the patient's history were reviewed and updated as appropriate: allergies, current medications, past family history, past medical history, past social history, past surgical history and problem list. Problem list updated.  Objective:   Vitals:   04/10/18 0914  BP: 99/62  Pulse: 61  Weight: 118 lb 6.4 oz (53.7 kg)    Fetal Status: Fetal Heart Rate (bpm): 153   Movement: Present     General:  Alert, oriented and cooperative. Patient is in no acute distress.  Skin: Skin is warm and dry. No rash noted.   Cardiovascular: Normal heart rate noted  Respiratory: Normal respiratory effort, no problems with respiration noted  Abdomen: Soft, gravid, appropriate for gestational age.  Pain/Pressure: Present     Pelvic: Cervical exam deferred        Extremities: Normal range of motion.  Edema: Trace  Mental Status: Normal mood and affect. Normal behavior. Normal judgment and thought content.   Assessment and Plan:  Pregnancy: [id number] at 2839w2d  1. Supervision of high risk pregnancy, antepartum No issues  2. History of IUFD CL stable To start antenatal testing 28 weeks  Preterm labor symptoms and general obstetric precautions including but not limited to vaginal bleeding, contractions, leaking of fluid and fetal movement were reviewed in detail with the patient. Please refer to After Visit Summary for  other counseling recommendations.  Return in about 2 weeks (around 04/24/2018) for 2 hr GTT, 3rd trim labs, OB visit (MD).  Future Appointments  Date Time Provider Department Center  04/21/2018 12:00 PM [facility] [place] 3 [facility]  04/30/2018 10:45 AM [facility] US 2 [facility]    Conan BowensKelly M Davis, MD

## 2018-04-21 ENCOUNTER — Encounter (HOSPITAL_COMMUNITY): Payer: Self-pay

## 2018-04-21 ENCOUNTER — Ambulatory Visit (HOSPITAL_COMMUNITY)
Admission: RE | Admit: 2018-04-21 | Discharge: 2018-04-21 | Disposition: A | Discharge status: Home / Self Care | Payer: 59 | Source: Ambulatory Visit | Attending: Certified Nurse Midwife | Admitting: Certified Nurse Midwife

## 2018-04-21 ENCOUNTER — Other Ambulatory Visit (HOSPITAL_COMMUNITY): Payer: Self-pay | Admitting: Obstetrics and Gynecology

## 2018-04-21 DIAGNOSIS — Z362 Encounter for other antenatal screening follow-up: Secondary | ICD-10-CM | POA: Diagnosis not present

## 2018-04-21 DIAGNOSIS — Z3A26 26 weeks gestation of pregnancy: Secondary | ICD-10-CM

## 2018-04-21 DIAGNOSIS — O09292 Supervision of pregnancy with other poor reproductive or obstetric history, second trimester: Secondary | ICD-10-CM

## 2018-04-22 ENCOUNTER — Other Ambulatory Visit (HOSPITAL_COMMUNITY): Payer: Self-pay | Admitting: *Deleted

## 2018-04-22 DIAGNOSIS — O09293 Supervision of pregnancy with other poor reproductive or obstetric history, third trimester: Secondary | ICD-10-CM

## 2018-04-24 ENCOUNTER — Encounter: Payer: Self-pay | Admitting: Obstetrics and Gynecology

## 2018-04-24 ENCOUNTER — Ambulatory Visit (INDEPENDENT_AMBULATORY_CARE_PROVIDER_SITE_OTHER): Payer: 59 | Admitting: Obstetrics and Gynecology

## 2018-04-24 ENCOUNTER — Other Ambulatory Visit: Payer: 59

## 2018-04-24 VITALS — BP 96/62 | HR 92 | Wt 119.5 lb

## 2018-04-24 DIAGNOSIS — Z8759 Personal history of other complications of pregnancy, childbirth and the puerperium: Secondary | ICD-10-CM

## 2018-04-24 DIAGNOSIS — O0990 Supervision of high risk pregnancy, unspecified, unspecified trimester: Secondary | ICD-10-CM | POA: Diagnosis not present

## 2018-04-24 NOTE — Progress Notes (Signed)
   PRENATAL VISIT NOTE  Subjective:  Kari [name] is a 22 y.o. [phone] at [redacted]w[redacted]d being seen today for ongoing prenatal care.  She is currently monitored for the following issues for this high-risk pregnancy and has History of IUFD; Ovarian cyst during pregnancy; Supervision of high risk pregnancy, antepartum; UTI (urinary tract infection) during pregnancy; Vitamin D deficiency; and Nausea/vomiting in pregnancy on their problem list.  Patient reports no complaints.  Contractions: Not present. Vag. Bleeding: None.  Movement: Present. Denies leaking of fluid.   The following portions of the patient's history were reviewed and updated as appropriate: allergies, current medications, past family history, past medical history, past social history, past surgical history and problem list. Problem list updated.  Objective:   Vitals:   04/24/18 0915  BP: 96/62  Pulse: 92  Weight: 119 lb 8 oz (54.2 kg)    Fetal Status: Fetal Heart Rate (bpm): 152(Simultaneous filing. User may not have seen previous data.) Fundal Height: 27 cm Movement: Present     General:  Alert, oriented and cooperative. Patient is in no acute distress.  Skin: Skin is warm and dry. No rash noted.   Cardiovascular: Normal heart rate noted  Respiratory: Normal respiratory effort, no problems with respiration noted  Abdomen: Soft, gravid, appropriate for gestational age.  Pain/Pressure: Absent     Pelvic: Cervical exam deferred        Extremities: Normal range of motion.  Edema: None  Mental Status: Normal mood and affect. Normal behavior. Normal judgment and thought content.   Assessment and Plan:  Pregnancy: [id number] at [redacted]w[redacted]d  1. Supervision of high risk pregnancy, antepartum Patient is doing well without complaints Third trimester labs, and glucola today Patient declined tdap  2. History of IUFD BPP scheduled weekly starting next week  Preterm labor symptoms and general obstetric precautions including but not  limited to vaginal bleeding, contractions, leaking of fluid and fetal movement were reviewed in detail with the patient. Please refer to After Visit Summary for other counseling recommendations.  No follow-ups on file.  Future Appointments  Date Time Provider Department Center  04/30/2018 10:45 AM [facility] [place] 2 [facility]  05/08/2018 10:45 AM [facility] [place] 5 [facility]  05/15/2018 10:45 AM [facility] [place] 2 [facility]  05/22/2018 10:45 AM [facility] [place] 2 [facility]    Catalina Antigua, MD

## 2018-04-25 LAB — CBC
Hematocrit: 34.8 % (ref 34.0–46.6)
Hemoglobin: 11.5 g/dL (ref 11.1–15.9)
MCH: 26.5 pg — AB (ref 26.6–33.0)
MCHC: 33 g/dL (ref 31.5–35.7)
MCV: 80 fL (ref 79–97)
PLATELETS: 165 10*3/uL (ref 150–379)
RBC: 4.34 x10E6/uL (ref 3.77–5.28)
RDW: 13.8 % (ref 12.3–15.4)
WBC: 6.8 10*3/uL (ref 3.4–10.8)

## 2018-04-25 LAB — GLUCOSE TOLERANCE, 2 HOURS W/ 1HR
GLUCOSE, FASTING: 64 mg/dL — AB (ref 65–91)
Glucose, 1 hour: 112 mg/dL (ref 65–179)
Glucose, 2 hour: 94 mg/dL (ref 65–152)

## 2018-04-25 LAB — HIV ANTIBODY (ROUTINE TESTING W REFLEX): HIV Screen 4th Generation wRfx: NONREACTIVE

## 2018-04-25 LAB — RPR: RPR Ser Ql: NONREACTIVE

## 2018-04-30 ENCOUNTER — Ambulatory Visit (HOSPITAL_COMMUNITY)
Admission: RE | Admit: 2018-04-30 | Discharge: 2018-04-30 | Disposition: A | Discharge status: Home / Self Care | Payer: 59 | Source: Ambulatory Visit | Attending: Certified Nurse Midwife | Admitting: Certified Nurse Midwife

## 2018-04-30 ENCOUNTER — Other Ambulatory Visit: Payer: Self-pay | Admitting: Certified Nurse Midwife

## 2018-04-30 DIAGNOSIS — Z8759 Personal history of other complications of pregnancy, childbirth and the puerperium: Secondary | ICD-10-CM

## 2018-04-30 DIAGNOSIS — O09293 Supervision of pregnancy with other poor reproductive or obstetric history, third trimester: Secondary | ICD-10-CM | POA: Insufficient documentation

## 2018-04-30 DIAGNOSIS — Z3A28 28 weeks gestation of pregnancy: Secondary | ICD-10-CM

## 2018-04-30 DIAGNOSIS — O0990 Supervision of high risk pregnancy, unspecified, unspecified trimester: Secondary | ICD-10-CM

## 2018-05-08 ENCOUNTER — Ambulatory Visit (HOSPITAL_COMMUNITY)
Admission: RE | Admit: 2018-05-08 | Discharge: 2018-05-08 | Disposition: A | Discharge status: Home / Self Care | Payer: 59 | Source: Ambulatory Visit | Attending: Certified Nurse Midwife | Admitting: Certified Nurse Midwife

## 2018-05-08 ENCOUNTER — Ambulatory Visit (INDEPENDENT_AMBULATORY_CARE_PROVIDER_SITE_OTHER): Payer: 59 | Admitting: Obstetrics and Gynecology

## 2018-05-08 ENCOUNTER — Encounter (HOSPITAL_COMMUNITY): Payer: Self-pay

## 2018-05-08 ENCOUNTER — Encounter: Payer: Self-pay | Admitting: Obstetrics and Gynecology

## 2018-05-08 VITALS — BP 106/61 | HR 108 | Wt 120.4 lb

## 2018-05-08 DIAGNOSIS — O0990 Supervision of high risk pregnancy, unspecified, unspecified trimester: Secondary | ICD-10-CM

## 2018-05-08 DIAGNOSIS — Z8759 Personal history of other complications of pregnancy, childbirth and the puerperium: Secondary | ICD-10-CM

## 2018-05-08 DIAGNOSIS — O0993 Supervision of high risk pregnancy, unspecified, third trimester: Secondary | ICD-10-CM

## 2018-05-08 NOTE — Progress Notes (Signed)
   PRENATAL VISIT NOTE  Subjective:  Kari [name] is a 22 y.o. [phone] at [redacted]w[redacted]d being seen today for ongoing prenatal care.  She is currently monitored for the following issues for this high-risk pregnancy and has History of IUFD; Ovarian cyst during pregnancy; Supervision of high risk pregnancy, antepartum; UTI (urinary tract infection) during pregnancy; Vitamin D deficiency; and Nausea/vomiting in pregnancy on their problem list.  Patient [place]headedness at the beach that resolved..  Contractions: Irregular. Vag. Bleeding: None.  Movement: Present. Denies leaking of fluid.   The following portions of the patient's history were reviewed and updated as appropriate: allergies, current medications, past family history, past medical history, past social history, past surgical history and problem list. Problem list updated.  Objective:   Vitals:   05/08/18 1105  BP: 106/61  Pulse: (!) 108  Weight: 120 lb 6.4 oz (54.6 kg)    Fetal Status: Fetal Heart Rate (bpm): 158   Movement: Present     General:  Alert, oriented and cooperative. Patient is in no acute distress.  Skin: Skin is warm and dry. No rash noted.   Cardiovascular: Normal heart rate noted  Respiratory: Normal respiratory effort, no problems with respiration noted  Abdomen: Soft, gravid, appropriate for gestational age.  Pain/Pressure: Absent     Pelvic: Cervical exam deferred        Extremities: Normal range of motion.  Edema: None  Mental Status: Normal mood and affect. Normal behavior. Normal judgment and thought content.   Assessment and Plan:  Pregnancy: [id number] at [redacted]w[redacted]d  1. Supervision of high risk pregnancy, antepartum No issues  2. History of IUFD To start weekly testing BPP scheduled for today  Preterm labor symptoms and general obstetric precautions including but not limited to vaginal bleeding, contractions, leaking of fluid and fetal movement were reviewed in detail with the  patient. Please refer to After Visit Summary for other counseling recommendations.  Return in about 2 weeks (around 05/22/2018) for OB visit.  Future Appointments  Date Time Provider Department Center  05/15/2018 10:45 AM [facility] [place] 2 [facility]  05/22/2018 10:45 AM [facility] [place] 2 [facility]    Conan Bowens, MD

## 2018-05-08 NOTE — Progress Notes (Signed)
Pt noted over the weekend she had constant contractions and was lightheaded  Pt believes she may have been dehydrated.   Pt has been fine since.

## 2018-05-09 ENCOUNTER — Ambulatory Visit (INDEPENDENT_AMBULATORY_CARE_PROVIDER_SITE_OTHER): Payer: 59

## 2018-05-09 ENCOUNTER — Other Ambulatory Visit: Payer: 59

## 2018-05-09 DIAGNOSIS — Z8759 Personal history of other complications of pregnancy, childbirth and the puerperium: Secondary | ICD-10-CM

## 2018-05-09 DIAGNOSIS — O09293 Supervision of pregnancy with other poor reproductive or obstetric history, third trimester: Secondary | ICD-10-CM

## 2018-05-09 NOTE — Progress Notes (Signed)
Nurse visit for NST/AFI dx: hx of IUFD. NSTR reviewed with CNM. -

## 2018-05-09 NOTE — Progress Notes (Signed)
Chart reviewed for nurse visit. Agree with plan of care.   NST reviewed. Baseline 140, moderate variability, 15x15 accels, no decels  Rolm Bookbinder, [place] 05/09/2018 12:56 PM

## 2018-05-15 ENCOUNTER — Ambulatory Visit (HOSPITAL_COMMUNITY)
Admission: RE | Admit: 2018-05-15 | Discharge: 2018-05-15 | Disposition: A | Discharge status: Home / Self Care | Payer: 59 | Source: Ambulatory Visit | Attending: Certified Nurse Midwife | Admitting: Certified Nurse Midwife

## 2018-05-15 ENCOUNTER — Other Ambulatory Visit (HOSPITAL_COMMUNITY): Payer: Self-pay | Admitting: Maternal and Fetal Medicine

## 2018-05-15 ENCOUNTER — Encounter (HOSPITAL_COMMUNITY): Payer: Self-pay

## 2018-05-15 DIAGNOSIS — O219 Vomiting of pregnancy, unspecified: Secondary | ICD-10-CM

## 2018-05-15 DIAGNOSIS — O09293 Supervision of pregnancy with other poor reproductive or obstetric history, third trimester: Secondary | ICD-10-CM

## 2018-05-15 DIAGNOSIS — Z3A30 30 weeks gestation of pregnancy: Secondary | ICD-10-CM | POA: Diagnosis not present

## 2018-05-22 ENCOUNTER — Other Ambulatory Visit (HOSPITAL_COMMUNITY): Payer: Self-pay | Admitting: *Deleted

## 2018-05-22 ENCOUNTER — Ambulatory Visit (INDEPENDENT_AMBULATORY_CARE_PROVIDER_SITE_OTHER): Payer: 59 | Admitting: Obstetrics and Gynecology

## 2018-05-22 ENCOUNTER — Other Ambulatory Visit: Payer: Self-pay

## 2018-05-22 ENCOUNTER — Encounter: Payer: Self-pay | Admitting: Obstetrics and Gynecology

## 2018-05-22 ENCOUNTER — Ambulatory Visit (HOSPITAL_COMMUNITY)
Admission: RE | Admit: 2018-05-22 | Discharge: 2018-05-22 | Disposition: A | Discharge status: Home / Self Care | Payer: 59 | Source: Ambulatory Visit | Attending: Certified Nurse Midwife | Admitting: Certified Nurse Midwife

## 2018-05-22 ENCOUNTER — Encounter (HOSPITAL_COMMUNITY): Payer: Self-pay

## 2018-05-22 VITALS — BP 95/62 | HR 91 | Wt 126.0 lb

## 2018-05-22 DIAGNOSIS — O09293 Supervision of pregnancy with other poor reproductive or obstetric history, third trimester: Secondary | ICD-10-CM | POA: Insufficient documentation

## 2018-05-22 DIAGNOSIS — O0990 Supervision of high risk pregnancy, unspecified, unspecified trimester: Secondary | ICD-10-CM

## 2018-05-22 DIAGNOSIS — Z362 Encounter for other antenatal screening follow-up: Secondary | ICD-10-CM | POA: Insufficient documentation

## 2018-05-22 DIAGNOSIS — Z8759 Personal history of other complications of pregnancy, childbirth and the puerperium: Secondary | ICD-10-CM

## 2018-05-22 DIAGNOSIS — O219 Vomiting of pregnancy, unspecified: Secondary | ICD-10-CM

## 2018-05-22 DIAGNOSIS — O2342 Unspecified infection of urinary tract in pregnancy, second trimester: Secondary | ICD-10-CM

## 2018-05-22 DIAGNOSIS — Z3A31 31 weeks gestation of pregnancy: Secondary | ICD-10-CM | POA: Diagnosis not present

## 2018-05-22 DIAGNOSIS — Z3480 Encounter for supervision of other normal pregnancy, unspecified trimester: Secondary | ICD-10-CM | POA: Diagnosis not present

## 2018-05-22 NOTE — Patient Instructions (Signed)
Third Trimester of Pregnancy The third trimester is from week 28 through week 40 (months 7 through 9). The third trimester is a time when the unborn baby (fetus) is growing rapidly. At the end of the ninth month, the fetus is about 20 inches in length and weighs 6-10 pounds. Body changes during your third trimester Your body will continue to go through many changes during pregnancy. The changes vary from woman to woman. During the third trimester:  Your weight will continue to increase. You can expect to gain 25-35 pounds (11-16 kg) by the end of the pregnancy.  You may begin to get stretch marks on your hips, abdomen, and breasts.  You may urinate more often because the fetus is moving lower into your pelvis and pressing on your bladder.  You may develop or continue to have heartburn. This is caused by increased hormones that slow down muscles in the digestive tract.  You may develop or continue to have constipation because increased hormones slow digestion and cause the muscles that push waste through your intestines to relax.  You may develop hemorrhoids. These are swollen veins (varicose veins) in the rectum that can itch or be painful.  You may develop swollen, bulging veins (varicose veins) in your legs.  You may have increased body aches in the pelvis, back, or thighs. This is due to weight gain and increased hormones that are relaxing your joints.  You may have changes in your hair. These can include thickening of your hair, rapid growth, and changes in texture. Some women also have hair loss during or after pregnancy, or hair that feels dry or thin. Your hair will most likely return to normal after your baby is born.  Your breasts will continue to grow and they will continue to become tender. A yellow fluid (colostrum) may leak from your breasts. This is the first milk you are producing for your baby.  Your belly button may stick out.  You may notice more swelling in your hands,  face, or ankles.  You may have increased tingling or numbness in your hands, arms, and legs. The skin on your belly may also feel numb.  You may feel short of breath because of your expanding uterus.  You may have more problems sleeping. This can be caused by the size of your belly, increased need to urinate, and an increase in your body's metabolism.  You may notice the fetus "dropping," or moving lower in your abdomen (lightening).  You may have increased vaginal discharge.  You may notice your joints feel loose and you may have pain around your pelvic bone.  What to expect at prenatal visits You will have prenatal exams every 2 weeks until week 36. Then you will have weekly prenatal exams. During a routine prenatal visit:  You will be weighed to make sure you and the baby are growing normally.  Your blood pressure will be taken.  Your abdomen will be measured to track your baby's growth.  The fetal heartbeat will be listened to.  Any test results from the previous visit will be discussed.  You may have a cervical check near your due date to see if your cervix has softened or thinned (effaced).  You will be tested for Group B streptococcus. This happens between 35 and 37 weeks.  Your health care provider may ask you:  What your birth plan is.  How you are feeling.  If you are feeling the baby move.  If you have had   any abnormal symptoms, such as leaking fluid, bleeding, severe headaches, or abdominal cramping.  If you are using any tobacco products, including cigarettes, chewing tobacco, and electronic cigarettes.  If you have any questions.  Other tests or screenings that may be performed during your third trimester include:  Blood tests that check for low iron levels (anemia).  Fetal testing to check the health, activity level, and growth of the fetus. Testing is done if you have certain medical conditions or if there are problems during the  pregnancy.  Nonstress test (NST). This test checks the health of your baby to make sure there are no signs of problems, such as the baby not getting enough oxygen. During this test, a belt is placed around your belly. The baby is made to move, and its heart rate is monitored during movement.  What is false labor? False labor is a condition in which you feel small, irregular tightenings of the muscles in the womb (contractions) that usually go away with rest, changing position, or drinking water. These are called Braxton Hicks contractions. Contractions may last for hours, days, or even weeks before true labor sets in. If contractions come at regular intervals, become more frequent, increase in intensity, or become painful, you should see your health care provider. What are the signs of labor?  Abdominal cramps.  Regular contractions that start at 10 minutes apart and become stronger and more frequent with time.  Contractions that start on the top of the uterus and spread down to the lower abdomen and back.  Increased pelvic pressure and dull back pain.  A watery or bloody mucus discharge that comes from the vagina.  Leaking of amniotic fluid. This is also known as your "water breaking." It could be a slow trickle or a gush. Let your health care provider know if it has a color or strange odor. If you have any of these signs, call your health care provider right away, even if it is before your due date. Follow these instructions at home: Medicines  Follow your health care provider's instructions regarding medicine use. Specific medicines may be either safe or unsafe to take during pregnancy.  Take a prenatal vitamin that contains at least 600 micrograms (mcg) of folic acid.  If you develop constipation, try taking a stool softener if your health care provider approves. Eating and drinking  Eat a balanced diet that includes fresh fruits and vegetables, whole grains, good sources of protein  such as meat, eggs, or tofu, and low-fat dairy. Your health care provider will help you determine the amount of weight gain that is right for you.  Avoid raw meat and uncooked cheese. These carry germs that can cause birth defects in the baby.  If you have low calcium intake from food, talk to your health care provider about whether you should take a daily calcium supplement.  Eat four or five small meals rather than three large meals a day.  Limit foods that are high in fat and processed sugars, such as fried and sweet foods.  To prevent constipation: ? Drink enough fluid to keep your urine clear or pale yellow. ? Eat foods that are high in fiber, such as fresh fruits and vegetables, whole grains, and beans. Activity  Exercise only as directed by your health care provider. Most women can continue their usual exercise routine during pregnancy. Try to exercise for 30 minutes at least 5 days a week. Stop exercising if you experience uterine contractions.  Avoid heavy   lifting.  Do not exercise in extreme heat or humidity, or at high altitudes.  Wear low-heel, comfortable shoes.  Practice good posture.  You may continue to have sex unless your health care provider tells you otherwise. Relieving pain and discomfort  Take frequent breaks and rest with your legs elevated if you have leg cramps or low back pain.  Take warm sitz baths to soothe any pain or discomfort caused by hemorrhoids. Use hemorrhoid cream if your health care provider approves.  Wear a good support bra to prevent discomfort from breast tenderness.  If you develop varicose veins: ? Wear support pantyhose or compression stockings as told by your healthcare provider. ? Elevate your feet for 15 minutes, 3-4 times a day. Prenatal care  Write down your questions. Take them to your prenatal visits.  Keep all your prenatal visits as told by your health care provider. This is important. Safety  Wear your seat belt at  all times when driving.  Make a list of emergency phone numbers, including numbers for family, friends, the hospital, and police and fire departments. General instructions  Avoid cat litter boxes and soil used by cats. These carry germs that can cause birth defects in the baby. If you have a cat, ask someone to clean the litter box for you.  Do not travel far distances unless it is absolutely necessary and only with the approval of your health care provider.  Do not use hot tubs, steam rooms, or saunas.  Do not drink alcohol.  Do not use any products that contain nicotine or tobacco, such as cigarettes and e-cigarettes. If you need help quitting, ask your health care provider.  Do not use any medicinal herbs or unprescribed drugs. These chemicals affect the formation and growth of the baby.  Do not douche or use tampons or scented sanitary pads.  Do not cross your legs for long periods of time.  To prepare for the arrival of your baby: ? Take prenatal classes to understand, practice, and ask questions about labor and delivery. ? Make a trial run to the hospital. ? Visit the hospital and tour the maternity area. ? Arrange for maternity or paternity leave through employers. ? Arrange for family and friends to take care of pets while you are in the hospital. ? Purchase a rear-facing car seat and make sure you know how to install it in your car. ? Pack your hospital bag. ? Prepare the baby's nursery. Make sure to remove all pillows and stuffed animals from the baby's crib to prevent suffocation.  Visit your dentist if you have not gone during your pregnancy. Use a soft toothbrush to brush your teeth and be gentle when you floss. Contact a health care provider if:  You are unsure if you are in labor or if your water has broken.  You become dizzy.  You have mild pelvic cramps, pelvic pressure, or nagging pain in your abdominal area.  You have lower back pain.  You have persistent  nausea, vomiting, or diarrhea.  You have an unusual or bad smelling vaginal discharge.  You have pain when you urinate. Get help right away if:  Your water breaks before 37 weeks.  You have regular contractions less than 5 minutes apart before 37 weeks.  You have a fever.  You are leaking fluid from your vagina.  You have spotting or bleeding from your vagina.  You have severe abdominal pain or cramping.  You have rapid weight loss or weight gain.    You have shortness of breath with chest pain.  You notice sudden or extreme swelling of your face, hands, ankles, feet, or legs.  Your baby makes fewer than 10 movements in 2 hours.  You have severe headaches that do not go away when you take medicine.  You have vision changes. Summary  The third trimester is from week 28 through week 40, months 7 through 9. The third trimester is a time when the unborn baby (fetus) is growing rapidly.  During the third trimester, your discomfort may increase as you and your baby continue to gain weight. You may have abdominal, leg, and back pain, sleeping problems, and an increased need to urinate.  During the third trimester your breasts will keep growing and they will continue to become tender. A yellow fluid (colostrum) may leak from your breasts. This is the first milk you are producing for your baby.  False labor is a condition in which you feel small, irregular tightenings of the muscles in the womb (contractions) that eventually go away. These are called Braxton Hicks contractions. Contractions may last for hours, days, or even weeks before true labor sets in.  Signs of labor can include: abdominal cramps; regular contractions that start at 10 minutes apart and become stronger and more frequent with time; watery or bloody mucus discharge that comes from the vagina; increased pelvic pressure and dull back pain; and leaking of amniotic fluid. This information is not intended to replace advice  given to you by your health care provider. Make sure you discuss any questions you have with your health care provider. Document Released: 12/04/2001 Document Revised: 05/17/2016 Document Reviewed: 02/10/2013 Elsevier Interactive Patient Education  2017 [employer]  

## 2018-05-22 NOTE — Progress Notes (Signed)
Subjective:  Kari [name] is a 22 y.o. [phone] at [redacted]w[redacted]d being seen today for ongoing prenatal care.  She is currently monitored for the following issues for this high-risk pregnancy and has History of IUFD; Ovarian cyst during pregnancy; Supervision of high risk pregnancy, antepartum; UTI (urinary tract infection) during pregnancy; Vitamin D deficiency; and Nausea/vomiting in pregnancy on their problem list.  Patient reports no complaints.  Contractions: Irritability. Vag. Bleeding: None.  Movement: Present. Denies leaking of fluid.   The following portions of the patient's history were reviewed and updated as appropriate: allergies, current medications, past family history, past medical history, past social history, past surgical history and problem list. Problem list updated.  Objective:   Vitals:   05/22/18 0909  BP: 95/62  Pulse: 91  Weight: 126 lb (57.2 kg)    Fetal Status:     Movement: Present     General:  Alert, oriented and cooperative. Patient is in no acute distress.  Skin: Skin is warm and dry. No rash noted.   Cardiovascular: Normal heart rate noted  Respiratory: Normal respiratory effort, no problems with respiration noted  Abdomen: Soft, gravid, appropriate for gestational age. Pain/Pressure: Absent     Pelvic:  Cervical exam deferred        Extremities: Normal range of motion.  Edema: Trace  Mental Status: Normal mood and affect. Normal behavior. Normal judgment and thought content.   Urinalysis:      Assessment and Plan:  Pregnancy: [id number] at [redacted]w[redacted]d  1. Supervision of high risk pregnancy, antepartum Stable  2. History of IUFD Growth and BPP today Continue with weekly BPP  3. Urinary tract infection in mother during second trimester of pregnancy TOC today  Preterm labor symptoms and general obstetric precautions including but not limited to vaginal bleeding, contractions, leaking of fluid and fetal movement were reviewed in detail with the  patient. Please refer to After Visit Summary for other counseling recommendations.  Return in about 2 weeks (around 06/05/2018) for OB visit.   Hermina Staggers, MD

## 2018-05-24 LAB — CULTURE, OB URINE

## 2018-05-24 LAB — URINE CULTURE, OB REFLEX

## 2018-05-29 ENCOUNTER — Other Ambulatory Visit (HOSPITAL_COMMUNITY): Payer: Self-pay | Admitting: Maternal and Fetal Medicine

## 2018-05-29 ENCOUNTER — Encounter (HOSPITAL_COMMUNITY): Payer: Self-pay

## 2018-05-29 ENCOUNTER — Ambulatory Visit (HOSPITAL_COMMUNITY)
Admission: RE | Admit: 2018-05-29 | Discharge: 2018-05-29 | Disposition: A | Discharge status: Home / Self Care | Payer: 59 | Source: Ambulatory Visit | Attending: Certified Nurse Midwife | Admitting: Certified Nurse Midwife

## 2018-05-29 DIAGNOSIS — Z3A32 32 weeks gestation of pregnancy: Secondary | ICD-10-CM | POA: Diagnosis not present

## 2018-05-29 DIAGNOSIS — O09293 Supervision of pregnancy with other poor reproductive or obstetric history, third trimester: Secondary | ICD-10-CM | POA: Insufficient documentation

## 2018-06-05 ENCOUNTER — Ambulatory Visit (HOSPITAL_COMMUNITY)
Admission: RE | Admit: 2018-06-05 | Discharge: 2018-06-05 | Disposition: A | Discharge status: Home / Self Care | Payer: 59 | Source: Ambulatory Visit | Attending: Certified Nurse Midwife | Admitting: Certified Nurse Midwife

## 2018-06-05 ENCOUNTER — Other Ambulatory Visit (HOSPITAL_COMMUNITY): Payer: Self-pay | Admitting: Maternal and Fetal Medicine

## 2018-06-05 ENCOUNTER — Ambulatory Visit (INDEPENDENT_AMBULATORY_CARE_PROVIDER_SITE_OTHER): Payer: 59 | Admitting: Obstetrics and Gynecology

## 2018-06-05 ENCOUNTER — Encounter (HOSPITAL_COMMUNITY): Payer: Self-pay

## 2018-06-05 ENCOUNTER — Encounter: Payer: Self-pay | Admitting: Obstetrics and Gynecology

## 2018-06-05 VITALS — BP 95/60 | HR 98 | Wt 126.7 lb

## 2018-06-05 DIAGNOSIS — Z8759 Personal history of other complications of pregnancy, childbirth and the puerperium: Secondary | ICD-10-CM

## 2018-06-05 DIAGNOSIS — Z3A33 33 weeks gestation of pregnancy: Secondary | ICD-10-CM

## 2018-06-05 DIAGNOSIS — O09293 Supervision of pregnancy with other poor reproductive or obstetric history, third trimester: Secondary | ICD-10-CM | POA: Diagnosis not present

## 2018-06-05 DIAGNOSIS — O0990 Supervision of high risk pregnancy, unspecified, unspecified trimester: Secondary | ICD-10-CM

## 2018-06-05 NOTE — Progress Notes (Signed)
   PRENATAL VISIT NOTE  Subjective:  Kari [name] is a 22 y.o. [phone] at 2459w2d being seen today for ongoing prenatal care.  She is currently monitored for the following issues for this high-risk pregnancy and has History of IUFD; Ovarian cyst during pregnancy; Supervision of high risk pregnancy, antepartum; UTI (urinary tract infection) during pregnancy; Vitamin D deficiency; and Nausea/vomiting in pregnancy on their problem list.  Patient reports occasional contractions.  Contractions: Irregular. Vag. Bleeding: None.  Movement: Present. Denies leaking of fluid.   The following portions of the patient's history were reviewed and updated as appropriate: allergies, current medications, past family history, past medical history, past social history, past surgical history and problem list. Problem list updated.  Objective:   Vitals:   06/05/18 0852  BP: 95/60  Pulse: 98  Weight: 126 lb 11.2 oz (57.5 kg)    Fetal Status: Fetal Heart Rate (bpm): 150   Movement: Present     General:  Alert, oriented and cooperative. Patient is in no acute distress.  Skin: Skin is warm and dry. No rash noted.   Cardiovascular: Normal heart rate noted  Respiratory: Normal respiratory effort, no problems with respiration noted  Abdomen: Soft, gravid, appropriate for gestational age.  Pain/Pressure: Absent     Pelvic: Cervical exam deferred        Extremities: Normal range of motion.  Edema: Trace  Mental Status: Normal mood and affect. Normal behavior. Normal judgment and thought content.   Assessment and Plan:  Pregnancy: [id number] at 6759w2d  1. Supervision of high risk pregnancy, antepartum Undecided on contraception Considering depo  2. History of IUFD Weekly BPP Next one today   Preterm labor symptoms and general obstetric precautions including but not limited to vaginal bleeding, contractions, leaking of fluid and fetal movement were reviewed in detail with the patient. Please refer to  After Visit Summary for other counseling recommendations.  Return in about 2 weeks (around 06/19/2018) for OB visit (MD).  Future Appointments  Date Time Provider Department Center  06/05/2018 10:45 AM [facility] US 2 [facility]  06/12/2018 10:45 AM [facility] US 2 [facility]  06/19/2018 10:45 AM [facility] US 2 [facility]    Conan BowensKelly M Davis, MD

## 2018-06-12 ENCOUNTER — Ambulatory Visit (HOSPITAL_COMMUNITY)
Admission: RE | Admit: 2018-06-12 | Discharge: 2018-06-12 | Disposition: A | Discharge status: Home / Self Care | Payer: 59 | Source: Ambulatory Visit | Attending: Certified Nurse Midwife | Admitting: Certified Nurse Midwife

## 2018-06-12 ENCOUNTER — Encounter (HOSPITAL_COMMUNITY): Payer: Self-pay

## 2018-06-12 ENCOUNTER — Other Ambulatory Visit (HOSPITAL_COMMUNITY): Payer: Self-pay | Admitting: Maternal and Fetal Medicine

## 2018-06-12 DIAGNOSIS — Z362 Encounter for other antenatal screening follow-up: Secondary | ICD-10-CM | POA: Diagnosis not present

## 2018-06-12 DIAGNOSIS — O09293 Supervision of pregnancy with other poor reproductive or obstetric history, third trimester: Secondary | ICD-10-CM | POA: Diagnosis not present

## 2018-06-12 DIAGNOSIS — Z8759 Personal history of other complications of pregnancy, childbirth and the puerperium: Secondary | ICD-10-CM

## 2018-06-12 DIAGNOSIS — Z3A34 34 weeks gestation of pregnancy: Secondary | ICD-10-CM

## 2018-06-12 NOTE — Procedures (Signed)
Kari JourneyCheyenne C [name] Mar 02, 1996 5138w2d  Fetus A Non-Stress Test Interpretation for 06/12/18  Indication: Unsatisfactory BPP  Fetal Heart Rate A Mode: External Baseline Rate (A): 135 bpm Variability: Moderate Accelerations: 10 x 10, 15 x 15 Decelerations: None Multiple birth?: No  Uterine Activity Mode: Palpation, Toco Contraction Frequency (min): U/I Contraction Duration (sec): 30 Contraction Quality: Mild(Pt denies feeling) Resting Tone Palpated: Relaxed Resting Time: Adequate  Interpretation (Fetal Testing) Nonstress Test Interpretation: Reactive Overall Impression: Reassuring for gestational age Comments: Reviewed tracing with Dr. Sherrie Georgeecker

## 2018-06-19 ENCOUNTER — Ambulatory Visit (INDEPENDENT_AMBULATORY_CARE_PROVIDER_SITE_OTHER): Payer: 59 | Admitting: Obstetrics & Gynecology

## 2018-06-19 ENCOUNTER — Encounter: Payer: Self-pay | Admitting: Obstetrics & Gynecology

## 2018-06-19 ENCOUNTER — Ambulatory Visit (HOSPITAL_COMMUNITY)
Admission: RE | Admit: 2018-06-19 | Discharge: 2018-06-19 | Disposition: A | Discharge status: Home / Self Care | Payer: 59 | Source: Ambulatory Visit | Attending: Family Medicine | Admitting: Family Medicine

## 2018-06-19 ENCOUNTER — Encounter (HOSPITAL_COMMUNITY): Payer: Self-pay

## 2018-06-19 ENCOUNTER — Other Ambulatory Visit (HOSPITAL_COMMUNITY): Payer: Self-pay | Admitting: *Deleted

## 2018-06-19 ENCOUNTER — Other Ambulatory Visit (HOSPITAL_COMMUNITY)
Admission: RE | Admit: 2018-06-19 | Discharge: 2018-06-19 | Disposition: A | Discharge status: Home / Self Care | Payer: 59 | Source: Ambulatory Visit | Attending: Obstetrics & Gynecology | Admitting: Obstetrics & Gynecology

## 2018-06-19 ENCOUNTER — Inpatient Hospital Stay (HOSPITAL_COMMUNITY)
Admission: AD | Admit: 2018-06-19 | Discharge: 2018-06-19 | Disposition: A | Discharge status: Home / Self Care | Payer: Medicaid Other | Source: Ambulatory Visit | Attending: Obstetrics and Gynecology | Admitting: Obstetrics and Gynecology

## 2018-06-19 VITALS — BP 102/66 | HR 99 | Wt 128.0 lb

## 2018-06-19 DIAGNOSIS — O09293 Supervision of pregnancy with other poor reproductive or obstetric history, third trimester: Secondary | ICD-10-CM | POA: Insufficient documentation

## 2018-06-19 DIAGNOSIS — O4703 False labor before 37 completed weeks of gestation, third trimester: Secondary | ICD-10-CM

## 2018-06-19 DIAGNOSIS — Z362 Encounter for other antenatal screening follow-up: Secondary | ICD-10-CM | POA: Diagnosis not present

## 2018-06-19 DIAGNOSIS — O3663X0 Maternal care for excessive fetal growth, third trimester, not applicable or unspecified: Secondary | ICD-10-CM | POA: Diagnosis not present

## 2018-06-19 DIAGNOSIS — Z3A35 35 weeks gestation of pregnancy: Secondary | ICD-10-CM | POA: Insufficient documentation

## 2018-06-19 DIAGNOSIS — Z8759 Personal history of other complications of pregnancy, childbirth and the puerperium: Secondary | ICD-10-CM

## 2018-06-19 DIAGNOSIS — O4702 False labor before 37 completed weeks of gestation, second trimester: Secondary | ICD-10-CM

## 2018-06-19 DIAGNOSIS — O0990 Supervision of high risk pregnancy, unspecified, unspecified trimester: Secondary | ICD-10-CM | POA: Insufficient documentation

## 2018-06-19 LAB — URINALYSIS, ROUTINE W REFLEX MICROSCOPIC
Bilirubin Urine: NEGATIVE
GLUCOSE, UA: NEGATIVE mg/dL
HGB URINE DIPSTICK: NEGATIVE
Ketones, ur: 5 mg/dL — AB
Leukocytes, UA: NEGATIVE
Nitrite: NEGATIVE
PROTEIN: NEGATIVE mg/dL
Specific Gravity, Urine: 1.013 (ref 1.005–1.030)
pH: 5 (ref 5.0–8.0)

## 2018-06-19 NOTE — Patient Instructions (Signed)
Return to clinic for any scheduled appointments or obstetric concerns, or go to [facility] for evaluation  

## 2018-06-19 NOTE — Progress Notes (Signed)
   PRENATAL VISIT NOTE  Subjective:  Kari [name] is a 22 y.o. [phone] at 8278w2d being seen today for ongoing prenatal care.  She is currently monitored for the following issues for this high-risk pregnancy and has History of IUFD; Ovarian cyst during pregnancy; Supervision of high risk pregnancy, antepartum; UTI (urinary tract infection) during pregnancy; and Vitamin D deficiency on their problem list.  Patient reports occasional contractions, pressure and back pain. .  Contractions: Irregular. Vag. Bleeding: Scant.  Movement: Present. Denies leaking of fluid.   The following portions of the patient's history were reviewed and updated as appropriate: allergies, current medications, past family history, past medical history, past social history, past surgical history and problem list. Problem list updated.  Objective:   Vitals:   06/19/18 0825  BP: 102/66  Pulse: 99  Weight: 128 lb (58.1 kg)    Fetal Status: Fetal Heart Rate (bpm): 140 Fundal Height: 36 cm Movement: Present  Presentation: Vertex  General:  Alert, oriented and cooperative. Patient is in no acute distress.  Skin: Skin is warm and dry. No rash noted.   Cardiovascular: Normal heart rate noted  Respiratory: Normal respiratory effort, no problems with respiration noted  Abdomen: Soft, gravid, appropriate for gestational age.  Pain/Pressure: Present     Pelvic: Cervical exam performed Dilation: 3 Effacement (%): 50 Station: -2  Extremities: Normal range of motion.  Edema: None  Mental Status: Normal mood and affect. Normal behavior. Normal judgment and thought content.   Assessment and Plan:  Pregnancy: [id number] at 8878w2d  1. History of IUFD Already getting weekly BPP at [facility], will follow up results and manage accordingly. IOL at 39 weeks; already has a favorable cervical exam.  2. Supervision of high risk pregnancy, antepartum - Strep Gp B NAA - GC/Chlamydia probe amp [facility] Pelvic cultures done  today. Preterm labor symptoms and general obstetric precautions including but not limited to vaginal bleeding, contractions, leaking of fluid and fetal movement were reviewed in detail with the patient. Please refer to After Visit Summary for other counseling recommendations.  Return in about 1 week (around 06/26/2018) for OB Visit.  Future Appointments  Date Time Provider Department Center  06/19/2018 10:45 AM [facility] US 2 [facility]  06/27/2018  8:10 AM Sharyon Cableogers, Veronica C, CNM [facility] None    Jaynie CollinsUgonna Anyanwu, MD

## 2018-06-19 NOTE — Addendum Note (Signed)
Encounter addended by: Emeline DarlingKiser, Wendell Nicoson E, RT on: 06/19/2018 12:18 PM  Actions taken: Imaging Exam ended

## 2018-06-19 NOTE — Discharge Instructions (Signed)
Pelvic Rest °Pelvic rest may be recommended if: °· Your placenta is partially or completely covering the opening of your cervix (placenta previa). °· There is bleeding between the wall of the uterus and the amniotic sac in the first trimester of pregnancy (subchorionic hemorrhage). °· You went into labor too early (preterm labor). ° °Based on your overall health and the health of your baby, your health care provider will decide if pelvic rest is right for you. °How do I rest my pelvis? °For as long as told by your health care provider: °· Do not have sex, sexual stimulation, or an orgasm. °· Do not use tampons. Do not douche. Do not put anything in your vagina. °· Do not lift anything that is heavier than 10 lb (4.5 kg). °· Avoid activities that take a lot of effort (are strenuous). °· Avoid any activity in which your pelvic muscles could become strained. ° °When should I seek medical care? °Seek medical care if you have: °· Cramping pain in your lower abdomen. °· Vaginal discharge. °· A low, dull backache. °· Regular contractions. °· Uterine tightening. ° °When should I seek immediate medical care? °Seek immediate medical care if: °· You have vaginal bleeding and you are pregnant. ° °This information is not intended to replace advice given to you by your health care provider. Make sure you discuss any questions you have with your health care provider. °Document Released: 04/06/2011 Document Revised: 05/17/2016 Document Reviewed: 06/13/2015 °Elsevier Interactive Patient Education © 2018 Elsevier Inc. ° ° °Preterm Labor and Birth Information °Pregnancy normally lasts 39-41 weeks. Preterm labor is when labor starts early. It starts before you have been pregnant for 37 whole weeks. °What are the risk factors for preterm labor? °Preterm labor is more likely to occur in women who: °· Have an infection while pregnant. °· Have a cervix that is short. °· Have gone into preterm labor before. °· Have had surgery on their  cervix. °· Are younger than age 17. °· Are older than age 35. °· Are African American. °· Are pregnant with two or more babies. °· Take street drugs while pregnant. °· Smoke while pregnant. °· Do not gain enough weight while pregnant. °· Got pregnant right after another pregnancy. ° °What are the symptoms of preterm labor? °Symptoms of preterm labor include: °· Cramps. The cramps may feel like the cramps some women get during their period. The cramps may happen with watery poop (diarrhea). °· Pain in the belly (abdomen). °· Pain in the lower back. °· Regular contractions or tightening. It may feel like your belly is getting tighter. °· Pressure in the lower belly that seems to get stronger. °· More fluid (discharge) leaking from the vagina. The fluid may be watery or bloody. °· Water breaking. ° °Why is it important to notice signs of preterm labor? °Babies who are born early may not be fully developed. They have a higher chance for: °· Long-term heart problems. °· Long-term lung problems. °· Trouble controlling body systems, like breathing. °· Bleeding in the brain. °· A condition called cerebral palsy. °· Learning difficulties. °· Death. ° °These risks are highest for babies who are born before 34 weeks of pregnancy. °How is preterm labor treated? °Treatment depends on: °· How long you were pregnant. °· Your condition. °· The health of your baby. ° °Treatment may involve: °· Having a stitch (suture) placed in your cervix. When you give birth, your cervix opens so the baby can come out. The stitch keeps   the cervix from opening too soon. °· Staying at the hospital. °· Taking or getting medicines, such as: °? Hormone medicines. °? Medicines to stop contractions. °? Medicines to help the baby’s lungs develop. °? Medicines to prevent your baby from having cerebral palsy. ° °What should I do if I am in preterm labor? °If you think you are going into labor too soon, call your doctor right away. °How can I prevent preterm  labor? °· Do not use any tobacco products. °? Examples of these are cigarettes, chewing tobacco, and e-cigarettes. °? If you need help quitting, ask your doctor. °· Do not use street drugs. °· Do not use any medicines unless you ask your doctor if they are safe for you. °· Talk with your doctor before taking any herbal supplements. °· Make sure you gain enough weight. °· Watch for infection. If you think you might have an infection, get it checked right away. °· If you have gone into preterm labor before, tell your doctor. °This information is not intended to replace advice given to you by your health care provider. Make sure you discuss any questions you have with your health care provider. °Document Released: 03/08/2009 Document Revised: 05/22/2016 Document Reviewed: 05/02/2016 °Elsevier Interactive Patient Education © 2018 Elsevier Inc. ° °

## 2018-06-19 NOTE — MAU Provider Note (Signed)
Chief Complaint:  Contractions   First Provider Initiated Contact with Patient 06/19/18 2228     HPI: Kari [name] is a 22 y.o. [phone] at 7735w2dwho presents to maternity admissions reporting preterm uterine contractions for several hours. .Had some bloody mucous She reports good fetal movement, denies LOF, vaginal itching/burning, urinary symptoms, h/a, dizziness, n/v, diarrhea, constipation or fever/chills.  Being followed for hx IUFD with twice weekly testing  Abdominal Pain  This is a new problem. The current episode started today. The onset quality is gradual. The problem occurs intermittently. The problem has been waxing and waning. The pain is located in the suprapubic region, LLQ and RLQ. The pain is mild. The quality of the pain is cramping. The abdominal pain does not radiate. Pertinent negatives include no dysuria or fever. Nothing aggravates the pain. The pain is relieved by nothing. She has tried nothing for the symptoms.   RN Note: Pt repotrs contractions every 3-4 mins since 7pm. Pt denies LOF or vaginal bleeding. States she had some bloody mucous earlier today. Reports good fetal movement    Past Medical History: Past Medical History:  Diagnosis Date  . Anemia   . Anxiety   . Depression   . Headache    migrains  . IUFD (intrauterine fetal death) 2016    Past obstetric history: OB History  Gravida Para Term Preterm AB Living  4 2 1 1 1 1   SAB TAB Ectopic Multiple Live Births  1     0 1    # Outcome Date GA Lbr Len/2nd Weight Sex Delivery Anes PTL Lv  4 Current           3 Term 02/27/17 3155w3d 06:44 / 00:12 7 lb 7 oz (3.374 kg) M Vag-Spont EPI  LIV  2 SAB 01/2016          1 Preterm 10/08/15 9625w0d 03:20 / 00:02 3 lb 0.9 oz (1.385 kg) M Vag-Spont EPI  FD     Birth Comments: none    Past Surgical History: Past Surgical History:  Procedure Laterality Date  . APPENDECTOMY    . HERNIA REPAIR      Family History: Family History  Problem Relation Age of  Onset  . Alcohol abuse Neg Hx   . Arthritis Neg Hx   . Asthma Neg Hx   . Birth defects Neg Hx   . Cancer Neg Hx   . COPD Neg Hx   . Depression Neg Hx   . Diabetes Neg Hx   . Drug abuse Neg Hx   . Early death Neg Hx   . Hearing loss Neg Hx   . Heart disease Neg Hx   . Hyperlipidemia Neg Hx   . Hypertension Neg Hx   . Kidney disease Neg Hx   . Learning disabilities Neg Hx   . Mental illness Neg Hx   . Mental retardation Neg Hx   . Miscarriages / Stillbirths Neg Hx   . Stroke Neg Hx   . Vision loss Neg Hx   . Varicose Veins Neg Hx     Social History: Social History   Tobacco Use  . Smoking status: Never Smoker  . Smokeless tobacco: Never Used  Substance Use Topics  . Alcohol use: No  . Drug use: No    Allergies: No Known Allergies  Meds:  Medications Prior to Admission  Medication Sig Dispense Refill Last Dose  . Prenat-FeAsp-Meth-FA-DHA w/o A (PRENATE PIXIE) 10-0.6-0.4-200 MG CAPS Take 1 tablet by mouth  daily. 30 capsule 12 06/18/2018 at Unknown time    I have reviewed patient's Past Medical Hx, Surgical Hx, Family Hx, Social Hx, medications and allergies.   ROS:  Review of Systems  Constitutional: Negative for chills, fatigue and fever.  Respiratory: Negative for shortness of breath.   Cardiovascular: Negative for leg swelling.  Gastrointestinal: Positive for abdominal pain.  Genitourinary: Positive for pelvic pain and vaginal bleeding. Negative for dysuria and flank pain.  Neurological: Negative for dizziness and weakness.   Other systems negative  Physical Exam   Patient Vitals for the past 24 hrs:  BP Temp Temp src Pulse Resp SpO2 Height Weight  06/19/18 2138 114/67 98.7 F (37.1 C) Oral 90 16 97 % 5\' 2"  (1.575 m) 129 lb (58.5 kg)   Constitutional: Well-developed, well-nourished female in no acute distress.  Cardiovascular: normal rate and rhythm Respiratory: normal effort, clear to auscultation bilaterally GI: Abd soft, non-tender, gravid  appropriate for gestational age.   No rebound or guarding. MS: Extremities nontender, no edema, normal ROM Neurologic: Alert and oriented x 4.  GU: Neg CVAT.  PELVIC EXAM:   Dilation: 3 Effacement (%): 60 Station: -2 Presentation: Vertex Exam by:: Artelia Laroche CNM   FHT:  Baseline 145 , moderate variability, accelerations present, no decelerations Contractions: q 3-7 mins Irregular     Labs: No results found for this or any previous visit (from the past 24 hour(s)). B/Positive/-- (01/24 1038)  Imaging:    MAU Course/MDM: I have ordered labs and reviewed results.  NST reviewed and is reassuring and reactive  Treatments in MAU included EFM.    Assessment: Single intrauterine pregnancy at [redacted]w[redacted]d Preterm uterine contractions, not in labor Hx IUFD with reassuring FHR tracing, category I  Plan: Discharge home Labor precautions and fetal kick counts Follow up in Office for prenatal visits and recheck of cervix  Encouraged to return here or to other Urgent Care/ED if she develops worsening of symptoms, increase in pain, fever, or other concerning symptoms.   Pt stable at time of discharge.  Wynelle Bourgeois CNM, MSN Certified Nurse-Midwife 06/19/2018 10:28 PM

## 2018-06-19 NOTE — Addendum Note (Signed)
Encounter addended by: Emeline DarlingKiser, Dyana Magner E, RT on: 06/19/2018 12:05 PM  Actions taken: Imaging Exam begun

## 2018-06-19 NOTE — MAU Note (Signed)
Pt repotrs contractions every 3-4 mins since 7pm. Pt denies LOF or vaginal bleeding. States she had some bloody mucous earlier today. Reports good fetal movement.

## 2018-06-20 LAB — GC/CHLAMYDIA PROBE AMP (~~LOC~~) NOT AT ARMC
CHLAMYDIA, DNA PROBE: NEGATIVE
Neisseria Gonorrhea: NEGATIVE

## 2018-06-21 ENCOUNTER — Other Ambulatory Visit: Payer: Self-pay

## 2018-06-21 ENCOUNTER — Inpatient Hospital Stay (HOSPITAL_COMMUNITY)
Admission: AD | Admit: 2018-06-21 | Discharge: 2018-06-21 | Disposition: A | Discharge status: Home / Self Care | Payer: 59 | Source: Ambulatory Visit | Attending: Obstetrics and Gynecology | Admitting: Obstetrics and Gynecology

## 2018-06-21 ENCOUNTER — Encounter (HOSPITAL_COMMUNITY): Payer: Self-pay | Admitting: *Deleted

## 2018-06-21 DIAGNOSIS — Z0371 Encounter for suspected problem with amniotic cavity and membrane ruled out: Secondary | ICD-10-CM | POA: Diagnosis not present

## 2018-06-21 DIAGNOSIS — Z3A35 35 weeks gestation of pregnancy: Secondary | ICD-10-CM | POA: Diagnosis not present

## 2018-06-21 LAB — URINALYSIS, ROUTINE W REFLEX MICROSCOPIC
Bilirubin Urine: NEGATIVE
GLUCOSE, UA: 50 mg/dL — AB
Hgb urine dipstick: NEGATIVE
Ketones, ur: NEGATIVE mg/dL
LEUKOCYTES UA: NEGATIVE
NITRITE: NEGATIVE
PH: 5 (ref 5.0–8.0)
PROTEIN: NEGATIVE mg/dL
Specific Gravity, Urine: 1.015 (ref 1.005–1.030)

## 2018-06-21 LAB — AMNISURE RUPTURE OF MEMBRANE (ROM) NOT AT ARMC: AMNISURE: NEGATIVE

## 2018-06-21 LAB — POCT FERN TEST

## 2018-06-21 LAB — STREP GP B NAA: Strep Gp B NAA: NEGATIVE

## 2018-06-21 NOTE — Discharge Instructions (Signed)
Braxton Hicks Contractions °Contractions of the uterus can occur throughout pregnancy, but they are not always a sign that you are in labor. You may have practice contractions called Braxton Hicks contractions. These false labor contractions are sometimes confused with true labor. °What are Braxton Hicks contractions? °Braxton Hicks contractions are tightening movements that occur in the muscles of the uterus before labor. Unlike true labor contractions, these contractions do not result in opening (dilation) and thinning of the cervix. Toward the end of pregnancy (32-34 weeks), Braxton Hicks contractions can happen more often and may become stronger. These contractions are sometimes difficult to tell apart from true labor because they can be very uncomfortable. You should not feel embarrassed if you go to the hospital with false labor. °Sometimes, the only way to tell if you are in true labor is for your health care provider to look for changes in the cervix. The health care provider will do a physical exam and may monitor your contractions. If you are not in true labor, the exam should show that your cervix is not dilating and your water has not broken. °If there are other health problems associated with your pregnancy, it is completely safe for you to be sent home with false labor. You may continue to have Braxton Hicks contractions until you go into true labor. °How to tell the difference between true labor and false labor °True labor °· Contractions last 30-70 seconds. °· Contractions become very regular. °· Discomfort is usually felt in the top of the uterus, and it spreads to the lower abdomen and low back. °· Contractions do not go away with walking. °· Contractions usually become more intense and increase in frequency. °· The cervix dilates and gets thinner. °False labor °· Contractions are usually shorter and not as strong as true labor contractions. °· Contractions are usually irregular. °· Contractions  are often felt in the front of the lower abdomen and in the groin. °· Contractions may go away when you walk around or change positions while lying down. °· Contractions get weaker and are shorter-lasting as time goes on. °· The cervix usually does not dilate or become thin. °Follow these instructions at home: °· Take over-the-counter and prescription medicines only as told by your health care provider. °· Keep up with your usual exercises and follow other instructions from your health care provider. °· Eat and drink lightly if you think you are going into labor. °· If Braxton Hicks contractions are making you uncomfortable: °? Change your position from lying down or resting to walking, or change from walking to resting. °? Sit and rest in a tub of warm water. °? Drink enough fluid to keep your urine pale yellow. Dehydration may cause these contractions. °? Do slow and deep breathing several times an hour. °· Keep all follow-up prenatal visits as told by your health care provider. This is important. °Contact a health care provider if: °· You have a fever. °· You have continuous pain in your abdomen. °Get help right away if: °· Your contractions become stronger, more regular, and closer together. °· You have fluid leaking or gushing from your vagina. °· You pass blood-tinged mucus (bloody show). °· You have bleeding from your vagina. °· You have low back pain that you never had before. °· You feel your baby’s head pushing down and causing pelvic pressure. °· Your baby is not moving inside you as much as it used to. °Summary °· Contractions that occur before labor are called Braxton   Hicks contractions, false labor, or practice contractions. °· Braxton Hicks contractions are usually shorter, weaker, farther apart, and less regular than true labor contractions. True labor contractions usually become progressively stronger and regular and they become more frequent. °· Manage discomfort from Braxton Hicks contractions by  changing position, resting in a warm bath, drinking plenty of water, or practicing deep breathing. °This information is not intended to replace advice given to you by your health care provider. Make sure you discuss any questions you have with your health care provider. °Document Released: 04/25/2017 Document Revised: 04/25/2017 Document Reviewed: 04/25/2017 °Elsevier Interactive Patient Education © 2018 Elsevier Inc. ° °

## 2018-06-21 NOTE — MAU Provider Note (Signed)
Chief Complaint  Patient presents with  . Contractions  . Rupture of Membranes     First Provider Initiated Contact with Patient 06/21/18 2145      S: Kari [name]  is a 22 y.o. y.o. year old [id number] female at 6280w4d weeks gestation who presents to MAU reporting leaking of clear fluid a few times since 1900. None while in MAU   Contractions: Rare, mild Vaginal bleeding: Denies Fetal movement: Normal  O:  Patient Vitals for the past 24 hrs:  BP Temp Temp src Pulse Resp SpO2 Height Weight  06/21/18 2050 (!) 109/58 98.3 F (36.8 C) Oral 94 18 99 % 5\' 2"  (1.575 m) 129 lb 12 oz (58.9 kg)   General: NAD Heart: Regular rate Lungs: Normal rate and effort Abd: Soft, NT, Gravid, S=D Pelvic: NEFG, negative pooling, moderate amount of mucus, no blood.  Dilation: 3 Effacement (%): Thick Station: Ballotable Presentation: Vertex Exam by:: Dorathy K[place] Yorel Redder, CNM  EFM: 150, Moderate variability, 15 x 15 accelerations, no decelerations Toco: Rare, mild  Negative Fern Negative Amnisure  A: 7680w4d week IUP No evidence of rupture of membranes or active labor.  Likely passing mucous plug Cervical exam stable from last visit. FHR reactive  P: Discharge home in stable condition. Return labor precautions and fetal kick counts. Follow-up as scheduled for prenatal visit or sooner as needed if symptoms worsen. Return to maternity admissions as needed if symptoms worsen.  Katrinka BlazingSmith, [place], CNM 06/21/2018 11:07 PM  2

## 2018-06-21 NOTE — MAU Note (Signed)
Pt reports cramping  since she had LOF @ 1900. Pt was 3 cm 2 days ago

## 2018-06-21 NOTE — MAU Note (Signed)
Pt. Leaking warm fluid around 7pm. States cramping since then and some leaking of fluid. States she was here on earlier in the week and was 3 cm. Denies vaginal bleeding. States some mild ctx sporadically.

## 2018-06-23 ENCOUNTER — Inpatient Hospital Stay (HOSPITAL_COMMUNITY)
Admission: AD | Admit: 2018-06-23 | Discharge: 2018-06-24 | Disposition: A | Discharge status: Home / Self Care | Payer: 59 | Source: Ambulatory Visit | Attending: Obstetrics & Gynecology | Admitting: Obstetrics & Gynecology

## 2018-06-23 ENCOUNTER — Other Ambulatory Visit: Payer: Self-pay

## 2018-06-23 ENCOUNTER — Inpatient Hospital Stay (HOSPITAL_BASED_OUTPATIENT_CLINIC_OR_DEPARTMENT_OTHER): Payer: 59

## 2018-06-23 ENCOUNTER — Encounter (HOSPITAL_COMMUNITY): Payer: Self-pay | Admitting: *Deleted

## 2018-06-23 DIAGNOSIS — O09293 Supervision of pregnancy with other poor reproductive or obstetric history, third trimester: Secondary | ICD-10-CM

## 2018-06-23 DIAGNOSIS — O479 False labor, unspecified: Secondary | ICD-10-CM

## 2018-06-23 DIAGNOSIS — O4703 False labor before 37 completed weeks of gestation, third trimester: Secondary | ICD-10-CM | POA: Insufficient documentation

## 2018-06-23 DIAGNOSIS — Z3A35 35 weeks gestation of pregnancy: Secondary | ICD-10-CM

## 2018-06-23 DIAGNOSIS — O289 Unspecified abnormal findings on antenatal screening of mother: Secondary | ICD-10-CM | POA: Diagnosis not present

## 2018-06-23 NOTE — MAU Note (Signed)
Pt presents to MAU c/o ctx every 3-275min for the last two hours. Pt denies bleeding or LOF and reports +FM. No complications per pt. Pt states she was 3/60/-2 at her last check.

## 2018-06-24 NOTE — MAU Provider Note (Addendum)
Chief Complaint:  Contractions   First Provider Initiated Contact with Patient 06/23/18 2220      S: Kari [name] is a 22 y.o. [phone] at 5835w6dwho presents to maternity admissions reporting contractions. She reports contractions started around 2000 tonight. Reports contractions are 6-7 minutes apart when they first began then became 3-5 minutes apart. She is rating pain 7/10. Presented to MAU on 6/29 for contractions and was noted to be 3cm. She reports good fetal movement, denies LOF, vaginal bleeding.  O: Patient Vitals for the past 24 hrs:  BP Temp Temp src Pulse Resp Weight  06/24/18 0029 104/66 98 F (36.7 C) Oral 91 17 -  06/23/18 2206 106/70 98.1 F (36.7 C) Oral (!) 105 17 -  06/23/18 2202 - - - - - 128 lb 1.9 oz (58.1 kg)   Constitutional: Well-developed, well-nourished female in no acute distress.  Cardiovascular: normal rate Respiratory: normal effort GI: Abd soft, non-tender, gravid appropriate for gestational age. Mild contractions palpated  Neurologic: Alert and oriented x 4.   CERVICAL EXAM: no cervical change after reassessment in 2 hours  Dilation: 2(3cm external os) Effacement (%): Thick Cervical Position: Posterior Station: Ballotable Presentation: Vertex Exam by:: V.Candiss Galeana CNM   FHT:  Baseline 135 , moderate variability, accelerations present, no decelerations Contractions: q 7-8 mins  Imaging:  BPP done on 06/23/2018: 8/8  FHR: 133 AFV: WNL  Presentation: cephalic   MAU Course/MDM: Orders Placed This Encounter  Procedures  . US MFM Fetal BPP Wo Non Stress  . Discharge patient Discharge disposition: 01-Home or Self Care; Discharge patient date: 06/24/2018   No cervical change after reassessment of cervix in 2 hours  Discussed results of US with patient  Discussed labor precautions and reasons to return to MAU  NST reviewed- reactive for gestational age Pt discharge with labor precautions.  A/P: 1. Braxton Hicks contractions    Discharge  home Labor precautions and fetal kick counts Follow up as scheduled in the office Return to MAU as needed   Follow-up Information    [facility].   Specialty:  Obstetrics and Gynecology Why:  Follow up as scheduled for prenatal appointments  Contact information: [address], [address] [place] [id number] [phone]          Allergies as of 06/24/2018   No Known Allergies     Medication List    TAKE these medications   PRENATE PIXIE 10-0.6-0.4-200 MG Caps Take 1 tablet by mouth daily.       Steward DroneVeronica Justeen Hehr Certified Nurse-Midwife 06/24/2018 12:17 AM

## 2018-06-24 NOTE — Discharge Instructions (Signed)
Braxton Hicks Contractions °Contractions of the uterus can occur throughout pregnancy, but they are not always a sign that you are in labor. You may have practice contractions called Braxton Hicks contractions. These false labor contractions are sometimes confused with true labor. °What are Braxton Hicks contractions? °Braxton Hicks contractions are tightening movements that occur in the muscles of the uterus before labor. Unlike true labor contractions, these contractions do not result in opening (dilation) and thinning of the cervix. Toward the end of pregnancy (32-34 weeks), Braxton Hicks contractions can happen more often and may become stronger. These contractions are sometimes difficult to tell apart from true labor because they can be very uncomfortable. You should not feel embarrassed if you go to the hospital with false labor. °Sometimes, the only way to tell if you are in true labor is for your health care provider to look for changes in the cervix. The health care provider will do a physical exam and may monitor your contractions. If you are not in true labor, the exam should show that your cervix is not dilating and your water has not broken. °If there are other health problems associated with your pregnancy, it is completely safe for you to be sent home with false labor. You may continue to have Braxton Hicks contractions until you go into true labor. °How to tell the difference between true labor and false labor °True labor °· Contractions last 30-70 seconds. °· Contractions become very regular. °· Discomfort is usually felt in the top of the uterus, and it spreads to the lower abdomen and low back. °· Contractions do not go away with walking. °· Contractions usually become more intense and increase in frequency. °· The cervix dilates and gets thinner. °False labor °· Contractions are usually shorter and not as strong as true labor contractions. °· Contractions are usually irregular. °· Contractions  are often felt in the front of the lower abdomen and in the groin. °· Contractions may go away when you walk around or change positions while lying down. °· Contractions get weaker and are shorter-lasting as time goes on. °· The cervix usually does not dilate or become thin. °Follow these instructions at home: °· Take over-the-counter and prescription medicines only as told by your health care provider. °· Keep up with your usual exercises and follow other instructions from your health care provider. °· Eat and drink lightly if you think you are going into labor. °· If Braxton Hicks contractions are making you uncomfortable: °? Change your position from lying down or resting to walking, or change from walking to resting. °? Sit and rest in a tub of warm water. °? Drink enough fluid to keep your urine pale yellow. Dehydration may cause these contractions. °? Do slow and deep breathing several times an hour. °· Keep all follow-up prenatal visits as told by your health care provider. This is important. °Contact a health care provider if: °· You have a fever. °· You have continuous pain in your abdomen. °Get help right away if: °· Your contractions become stronger, more regular, and closer together. °· You have fluid leaking or gushing from your vagina. °· You pass blood-tinged mucus (bloody show). °· You have bleeding from your vagina. °· You have low back pain that you never had before. °· You feel your baby’s head pushing down and causing pelvic pressure. °· Your baby is not moving inside you as much as it used to. °Summary °· Contractions that occur before labor are called Braxton   Hicks contractions, false labor, or practice contractions. °· Braxton Hicks contractions are usually shorter, weaker, farther apart, and less regular than true labor contractions. True labor contractions usually become progressively stronger and regular and they become more frequent. °· Manage discomfort from Braxton Hicks contractions by  changing position, resting in a warm bath, drinking plenty of water, or practicing deep breathing. °This information is not intended to replace advice given to you by your health care provider. Make sure you discuss any questions you have with your health care provider. °Document Released: 04/25/2017 Document Revised: 04/25/2017 Document Reviewed: 04/25/2017 °Elsevier Interactive Patient Education © 2018 Elsevier Inc. ° °

## 2018-06-25 ENCOUNTER — Encounter (HOSPITAL_COMMUNITY): Payer: Self-pay

## 2018-06-25 ENCOUNTER — Other Ambulatory Visit (HOSPITAL_COMMUNITY): Payer: Self-pay | Admitting: Obstetrics and Gynecology

## 2018-06-25 ENCOUNTER — Ambulatory Visit (HOSPITAL_COMMUNITY)
Admission: RE | Admit: 2018-06-25 | Discharge: 2018-06-25 | Disposition: A | Discharge status: Home / Self Care | Payer: 59 | Source: Ambulatory Visit | Attending: Certified Nurse Midwife | Admitting: Certified Nurse Midwife

## 2018-06-25 ENCOUNTER — Other Ambulatory Visit (HOSPITAL_COMMUNITY): Payer: Self-pay | Admitting: *Deleted

## 2018-06-25 DIAGNOSIS — Z3A36 36 weeks gestation of pregnancy: Secondary | ICD-10-CM | POA: Diagnosis not present

## 2018-06-25 DIAGNOSIS — O09293 Supervision of pregnancy with other poor reproductive or obstetric history, third trimester: Secondary | ICD-10-CM | POA: Diagnosis not present

## 2018-06-25 DIAGNOSIS — Z8759 Personal history of other complications of pregnancy, childbirth and the puerperium: Secondary | ICD-10-CM

## 2018-06-27 ENCOUNTER — Ambulatory Visit (INDEPENDENT_AMBULATORY_CARE_PROVIDER_SITE_OTHER): Payer: 59 | Admitting: Certified Nurse Midwife

## 2018-06-27 ENCOUNTER — Encounter: Payer: Self-pay | Admitting: Certified Nurse Midwife

## 2018-06-27 VITALS — BP 102/65 | HR 114 | Wt 131.7 lb

## 2018-06-27 DIAGNOSIS — O0990 Supervision of high risk pregnancy, unspecified, unspecified trimester: Secondary | ICD-10-CM

## 2018-06-27 DIAGNOSIS — O0993 Supervision of high risk pregnancy, unspecified, third trimester: Secondary | ICD-10-CM

## 2018-06-27 DIAGNOSIS — Z8759 Personal history of other complications of pregnancy, childbirth and the puerperium: Secondary | ICD-10-CM

## 2018-06-27 NOTE — Progress Notes (Signed)
Patient reports good fetal movement with irregular contractions.  

## 2018-06-27 NOTE — Patient Instructions (Signed)
Reasons to go to MAU:  1.  Contractions are  5 minutes apart or less, each last 1 minute, these have been going on for 1-2 hours, and you cannot walk or talk during them 2.  You have a large gush of fluid, or a trickle of fluid that will not stop and you have to wear a pad 3.  You have bleeding that is bright red, heavier than spotting--like menstrual bleeding (spotting can be normal in early labor or after a check of your cervix) 4.  You do not feel the baby moving like he/she normally does  

## 2018-06-27 NOTE — Progress Notes (Signed)
   PRENATAL VISIT NOTE  Subjective:  Kari [name] is a 22 y.o. [phone] at 6076w3d being seen today for ongoing prenatal care.  She is currently monitored for the following issues for this high-risk pregnancy and has History of IUFD; Ovarian cyst during pregnancy; Supervision of high risk pregnancy, antepartum; and Vitamin D deficiency on their problem list.  Patient reports occasional contractions.  Contractions: Irregular. Vag. Bleeding: None.  Movement: Present. Denies leaking of fluid.   The following portions of the patient's history were reviewed and updated as appropriate: allergies, current medications, past family history, past medical history, past social history, past surgical history and problem list. Problem list updated.  Objective:   Vitals:   06/27/18 0821  BP: 102/65  Pulse: (!) 114  Weight: 131 lb 11.2 oz (59.7 kg)    Fetal Status: Fetal Heart Rate (bpm): 148   Movement: Present     General:  Alert, oriented and cooperative. Patient is in no acute distress.  Skin: Skin is warm and dry. No rash noted.   Cardiovascular: Normal heart rate noted  Respiratory: Normal respiratory effort, no problems with respiration noted  Abdomen: Soft, gravid, appropriate for gestational age.  Pain/Pressure: Present     Pelvic: Cervical exam deferred        Extremities: Normal range of motion.  Edema: None  Mental Status: Normal mood and affect. Normal behavior. Normal judgment and thought content.   Assessment and Plan:  Pregnancy: [id number] at 4576w3d  1. Supervision of high risk pregnancy, antepartum -Patient doing well, complaints of irregular contractions. She reports contractions occur every 5 minutes for 2-3 hours then completely stop.  -Educated and discussed reasons to go to MAU to be evaluated especially with hx   2. History of IUFD -hx of IUFD at 30weeks  -BPP on 7/1 and 7/3 showed 8/8 BPP  -MFM recommends if nonreactive NST after 37 weeks to delivery patient due to  unexplained stillbirth. BPP not necessary.  -BPP and NST scheduled for 07/03/18.    Term labor symptoms and general obstetric precautions including but not limited to vaginal bleeding, contractions, leaking of fluid and fetal movement were reviewed in detail with the patient. Please refer to After Visit Summary for other counseling recommendations.  Return in about 6 days (around 07/03/2018) for ROB, NST.  Future Appointments  Date Time Provider Department Center  07/03/2018  9:45 AM [facility] US 2 [facility]  07/03/2018 10:45 AM [facility] NST [facility]  07/04/2018 11:10 AM Marvetta GibbonsBurleson, Brand Maleserri L, NP [facility] None  07/10/2018 10:00 AM [facility] US 3 [facility]  07/17/2018 10:00 AM [facility] US 3 [facility]    Sharyon CableVeronica C Nissim Fleischer, CNM

## 2018-07-03 ENCOUNTER — Ambulatory Visit (HOSPITAL_COMMUNITY)
Admission: RE | Admit: 2018-07-03 | Discharge: 2018-07-03 | Disposition: A | Discharge status: Home / Self Care | Payer: 59 | Source: Ambulatory Visit | Attending: Certified Nurse Midwife | Admitting: Certified Nurse Midwife

## 2018-07-03 ENCOUNTER — Ambulatory Visit (HOSPITAL_COMMUNITY)
Admission: RE | Admit: 2018-07-03 | Discharge: 2018-07-03 | Disposition: A | Discharge status: Home / Self Care | Payer: 59 | Source: Ambulatory Visit | Attending: Family Medicine | Admitting: Family Medicine

## 2018-07-03 ENCOUNTER — Encounter (HOSPITAL_COMMUNITY): Payer: Self-pay

## 2018-07-03 DIAGNOSIS — O09293 Supervision of pregnancy with other poor reproductive or obstetric history, third trimester: Secondary | ICD-10-CM | POA: Diagnosis not present

## 2018-07-03 DIAGNOSIS — Z3A37 37 weeks gestation of pregnancy: Secondary | ICD-10-CM

## 2018-07-03 DIAGNOSIS — Z8759 Personal history of other complications of pregnancy, childbirth and the puerperium: Secondary | ICD-10-CM

## 2018-07-03 NOTE — Procedures (Signed)
Kari JourneyCheyenne C [name] [birth year] 3078w2d  Fetus A Non-Stress Test Interpretation for 07/03/18  Indication: prev IUFD at 30 wks  Fetal Heart Rate A Mode: External Baseline Rate (A): 150 bpm Variability: Moderate Accelerations: 15 x 15 Decelerations: None Multiple birth?: No  Uterine Activity Mode: Toco Contraction Frequency (min): Occ UC noted Contraction Duration (sec): 40-150 Contraction Quality: Mild Resting Tone Palpated: Relaxed Resting Time: Adequate  Interpretation (Fetal Testing) Nonstress Test Interpretation: Reactive Comments: FHR tracing rev'd by Dr. Judeth CornfieldShankar

## 2018-07-04 ENCOUNTER — Inpatient Hospital Stay (HOSPITAL_COMMUNITY)
Admission: AD | Admit: 2018-07-04 | Discharge: 2018-07-04 | Disposition: A | Discharge status: Home / Self Care | Payer: 59 | Source: Ambulatory Visit | Attending: Obstetrics and Gynecology | Admitting: Obstetrics and Gynecology

## 2018-07-04 ENCOUNTER — Ambulatory Visit (INDEPENDENT_AMBULATORY_CARE_PROVIDER_SITE_OTHER): Payer: 59 | Admitting: Nurse Practitioner

## 2018-07-04 ENCOUNTER — Encounter (HOSPITAL_COMMUNITY): Payer: Self-pay

## 2018-07-04 VITALS — BP 107/69 | HR 99 | Wt 131.2 lb

## 2018-07-04 DIAGNOSIS — Z3A37 37 weeks gestation of pregnancy: Secondary | ICD-10-CM | POA: Insufficient documentation

## 2018-07-04 DIAGNOSIS — O0993 Supervision of high risk pregnancy, unspecified, third trimester: Secondary | ICD-10-CM

## 2018-07-04 DIAGNOSIS — O0990 Supervision of high risk pregnancy, unspecified, unspecified trimester: Secondary | ICD-10-CM

## 2018-07-04 DIAGNOSIS — O479 False labor, unspecified: Secondary | ICD-10-CM

## 2018-07-04 MED ORDER — COMFORT FIT MATERNITY SUPP MED MISC: 0 refills | Status: DC

## 2018-07-04 NOTE — MAU Note (Signed)
Pt reporting sharp pain since 2300. Denies Bleeding/LOF. +FM

## 2018-07-04 NOTE — Progress Notes (Signed)
    Subjective:  Kari [name] is a 22 y.o. [phone] at 3539w3d being seen today for ongoing prenatal care.  She is currently monitored for the following issues for this high-risk pregnancy and has History of IUFD; Ovarian cyst during pregnancy; Supervision of high risk pregnancy, antepartum; and Vitamin D deficiency on their problem list.  Patient reports pain in her side that continues periodically.  Contractions: Irregular. Vag. Bleeding: None.  Movement: Present. Denies leaking of fluid.   The following portions of the patient's history were reviewed and updated as appropriate: allergies, current medications, past family history, past medical history, past social history, past surgical history and problem list. Problem list updated.  Objective:   Vitals:   07/04/18 1128  BP: 107/69  Pulse: 99  Weight: 131 lb 3.2 oz (59.5 kg)    Fetal Status: Fetal Heart Rate (bpm): 164 Fundal Height: 37 cm Movement: Present     General:  Alert, oriented and cooperative. Patient is in no acute distress.  Skin: Skin is warm and dry. No rash noted.   Cardiovascular: Normal heart rate noted  Respiratory: Normal respiratory effort, no problems with respiration noted  Abdomen: Soft, gravid, appropriate for gestational age. Pain/Pressure: Present     Pelvic:  Cervical exam deferred        Extremities: Normal range of motion.  Edema: None  Mental Status: Normal mood and affect. Normal behavior. Normal judgment and thought content.     Assessment and Plan:  Pregnancy: [id number] at 2139w3d  1. Supervision of high risk pregnancy, antepartum Client went to hospital yesterday for evaluation due to a sharp pain in her left abdomen.  Baby was moving well.  Still has the pain from time to time.  Advised a pregnancy support belt to see if that might help.  Prescription written and given for client to take to medical supply store. Advised to seek care for evaluation if the baby is not moving well.  Term labor  symptoms and general obstetric precautions including but not limited to vaginal bleeding, contractions, leaking of fluid and fetal movement were reviewed in detail with the patient. Please refer to After Visit Summary for other counseling recommendations.  Return in about 1 week (around 07/11/2018).  Nolene BernheimERRI Ahria Slappey, RN, MSN, NP-BC Nurse Practitioner, [facility], [facility] 07/04/2018 12:43 PM

## 2018-07-04 NOTE — MAU Note (Signed)
I have communicated with Dr Linwood Dibblesumball and reviewed vital signs:  Vitals:   07/04/18 0141 07/04/18 0331  BP: 116/69 107/72  Pulse: (!) 108   Resp: 18   Temp: 98.3 F (36.8 C)   SpO2: 100%     Vaginal exam:  Dilation: 3 Effacement (%): 50 Cervical Position: Posterior Station: -3 Presentation: Vertex Exam by:: TLYTLE RN,   Also reviewed contraction pattern and that non-stress test is reactive.  It has been documented that patient is contracting every irregularly minutes with no cervical change over 1.5 hours  not indicating active labor.  Patient denies any other complaints.  Based on this report provider has given order for discharge.  A discharge order and diagnosis entered by a provider.   Labor discharge instructions reviewed with patient.

## 2018-07-04 NOTE — Discharge Instructions (Signed)
Braxton Hicks Contractions °Contractions of the uterus can occur throughout pregnancy, but they are not always a sign that you are in labor. You may have practice contractions called Braxton Hicks contractions. These false labor contractions are sometimes confused with true labor. °What are Braxton Hicks contractions? °Braxton Hicks contractions are tightening movements that occur in the muscles of the uterus before labor. Unlike true labor contractions, these contractions do not result in opening (dilation) and thinning of the cervix. Toward the end of pregnancy (32-34 weeks), Braxton Hicks contractions can happen more often and may become stronger. These contractions are sometimes difficult to tell apart from true labor because they can be very uncomfortable. You should not feel embarrassed if you go to the hospital with false labor. °Sometimes, the only way to tell if you are in true labor is for your health care provider to look for changes in the cervix. The health care provider will do a physical exam and may monitor your contractions. If you are not in true labor, the exam should show that your cervix is not dilating and your water has not broken. °If there are other health problems associated with your pregnancy, it is completely safe for you to be sent home with false labor. You may continue to have Braxton Hicks contractions until you go into true labor. °How to tell the difference between true labor and false labor °True labor °· Contractions last 30-70 seconds. °· Contractions become very regular. °· Discomfort is usually felt in the top of the uterus, and it spreads to the lower abdomen and low back. °· Contractions do not go away with walking. °· Contractions usually become more intense and increase in frequency. °· The cervix dilates and gets thinner. °False labor °· Contractions are usually shorter and not as strong as true labor contractions. °· Contractions are usually irregular. °· Contractions  are often felt in the front of the lower abdomen and in the groin. °· Contractions may go away when you walk around or change positions while lying down. °· Contractions get weaker and are shorter-lasting as time goes on. °· The cervix usually does not dilate or become thin. °Follow these instructions at home: °· Take over-the-counter and prescription medicines only as told by your health care provider. °· Keep up with your usual exercises and follow other instructions from your health care provider. °· Eat and drink lightly if you think you are going into labor. °· If Braxton Hicks contractions are making you uncomfortable: °? Change your position from lying down or resting to walking, or change from walking to resting. °? Sit and rest in a tub of warm water. °? Drink enough fluid to keep your urine pale yellow. Dehydration may cause these contractions. °? Do slow and deep breathing several times an hour. °· Keep all follow-up prenatal visits as told by your health care provider. This is important. °Contact a health care provider if: °· You have a fever. °· You have continuous pain in your abdomen. °Get help right away if: °· Your contractions become stronger, more regular, and closer together. °· You have fluid leaking or gushing from your vagina. °· You pass blood-tinged mucus (bloody show). °· You have bleeding from your vagina. °· You have low back pain that you never had before. °· You feel your baby’s head pushing down and causing pelvic pressure. °· Your baby is not moving inside you as much as it used to. °Summary °· Contractions that occur before labor are called Braxton   Hicks contractions, false labor, or practice contractions. °· Braxton Hicks contractions are usually shorter, weaker, farther apart, and less regular than true labor contractions. True labor contractions usually become progressively stronger and regular and they become more frequent. °· Manage discomfort from Braxton Hicks contractions by  changing position, resting in a warm bath, drinking plenty of water, or practicing deep breathing. °This information is not intended to replace advice given to you by your health care provider. Make sure you discuss any questions you have with your health care provider. °Document Released: 04/25/2017 Document Revised: 04/25/2017 Document Reviewed: 04/25/2017 °Elsevier Interactive Patient Education © 2018 Elsevier Inc. ° °

## 2018-07-10 ENCOUNTER — Ambulatory Visit (HOSPITAL_BASED_OUTPATIENT_CLINIC_OR_DEPARTMENT_OTHER)
Admission: RE | Admit: 2018-07-10 | Discharge: 2018-07-10 | Disposition: A | Discharge status: Home / Self Care | Payer: 59 | Source: Ambulatory Visit | Attending: Family Medicine | Admitting: Family Medicine

## 2018-07-10 ENCOUNTER — Ambulatory Visit (HOSPITAL_COMMUNITY)
Admission: RE | Admit: 2018-07-10 | Discharge: 2018-07-10 | Disposition: A | Discharge status: Home / Self Care | Payer: 59 | Source: Ambulatory Visit | Attending: Certified Nurse Midwife | Admitting: Certified Nurse Midwife

## 2018-07-10 ENCOUNTER — Other Ambulatory Visit (HOSPITAL_COMMUNITY): Payer: Self-pay | Admitting: Obstetrics and Gynecology

## 2018-07-10 ENCOUNTER — Other Ambulatory Visit (HOSPITAL_COMMUNITY): Payer: Self-pay | Admitting: Maternal and Fetal Medicine

## 2018-07-10 ENCOUNTER — Encounter (HOSPITAL_COMMUNITY): Payer: Self-pay

## 2018-07-10 DIAGNOSIS — O09293 Supervision of pregnancy with other poor reproductive or obstetric history, third trimester: Secondary | ICD-10-CM | POA: Insufficient documentation

## 2018-07-10 DIAGNOSIS — Z3A38 38 weeks gestation of pregnancy: Secondary | ICD-10-CM

## 2018-07-10 DIAGNOSIS — Z8759 Personal history of other complications of pregnancy, childbirth and the puerperium: Secondary | ICD-10-CM

## 2018-07-10 NOTE — Procedures (Signed)
Margreta JourneyCheyenne C Perritt 23-Sep-1996 684w2d  Fetus A Non-Stress Test Interpretation for 07/10/18  Indication: prev IUFD  Fetal Heart Rate A Mode: External Baseline Rate (A): 140 bpm Variability: Moderate Accelerations: 15 x 15 Decelerations: None Multiple birth?: No  Uterine Activity Mode: Toco Contraction Frequency (min): irreg UC noted Contraction Duration (sec): 60-240 Contraction Quality: Mild Resting Tone Palpated: Relaxed Resting Time: Adequate  Interpretation (Fetal Testing) Nonstress Test Interpretation: Reactive Comments: FHR tracing rev'd by Dr. Judeth CornfieldShankar

## 2018-07-12 ENCOUNTER — Inpatient Hospital Stay (HOSPITAL_COMMUNITY)
Admission: AD | Admit: 2018-07-12 | Discharge: 2018-07-15 | DRG: 807 | Disposition: A | Discharge status: Home / Self Care | Payer: 59 | Attending: Obstetrics and Gynecology | Admitting: Obstetrics and Gynecology

## 2018-07-12 ENCOUNTER — Observation Stay (HOSPITAL_COMMUNITY): Payer: 59 | Admitting: Anesthesiology

## 2018-07-12 ENCOUNTER — Encounter (HOSPITAL_COMMUNITY): Payer: Self-pay

## 2018-07-12 DIAGNOSIS — Z3A38 38 weeks gestation of pregnancy: Secondary | ICD-10-CM

## 2018-07-12 DIAGNOSIS — Z8759 Personal history of other complications of pregnancy, childbirth and the puerperium: Secondary | ICD-10-CM

## 2018-07-12 LAB — TYPE AND SCREEN
ABO/RH(D): B POS
ANTIBODY SCREEN: NEGATIVE

## 2018-07-12 LAB — CBC
HEMATOCRIT: 31.2 % — AB (ref 36.0–46.0)
Hemoglobin: 10.5 g/dL — ABNORMAL LOW (ref 12.0–15.0)
MCH: 24.9 pg — ABNORMAL LOW (ref 26.0–34.0)
MCHC: 33.7 g/dL (ref 30.0–36.0)
MCV: 73.9 fL — AB (ref 78.0–100.0)
Platelets: 143 10*3/uL — ABNORMAL LOW (ref 150–400)
RBC: 4.22 MIL/uL (ref 3.87–5.11)
RDW: 14.1 % (ref 11.5–15.5)
WBC: 9.8 10*3/uL (ref 4.0–10.5)

## 2018-07-12 MED ORDER — LACTATED RINGERS IV SOLN
500.0000 mL | Freq: Once | INTRAVENOUS | Status: AC
  Administered 2018-07-12: 500 mL via INTRAVENOUS

## 2018-07-12 MED ORDER — OXYCODONE-ACETAMINOPHEN 5-325 MG PO TABS: 2.0000 | ORAL_TABLET | ORAL | Status: DC | PRN

## 2018-07-12 MED ORDER — FENTANYL 2.5 MCG/ML BUPIVACAINE 1/10 % EPIDURAL INFUSION (WH - ANES)
14.0000 mL/h | INTRAMUSCULAR | Status: DC | PRN
  Administered 2018-07-12: 14 mL/h via EPIDURAL
  Filled 2018-07-12: qty 100

## 2018-07-12 MED ORDER — SOD CITRATE-CITRIC ACID 500-334 MG/5ML PO SOLN: 30.0000 mL | ORAL | Status: DC | PRN

## 2018-07-12 MED ORDER — DIPHENHYDRAMINE HCL 50 MG/ML IJ SOLN: 12.5000 mg | INTRAMUSCULAR | Status: DC | PRN

## 2018-07-12 MED ORDER — ACETAMINOPHEN 325 MG PO TABS: 650.0000 mg | ORAL_TABLET | ORAL | Status: DC | PRN

## 2018-07-12 MED ORDER — OXYCODONE-ACETAMINOPHEN 5-325 MG PO TABS: 1.0000 | ORAL_TABLET | ORAL | Status: DC | PRN

## 2018-07-12 MED ORDER — ONDANSETRON HCL 4 MG/2ML IJ SOLN: 4.0000 mg | Freq: Four times a day (QID) | INTRAMUSCULAR | Status: DC | PRN

## 2018-07-12 MED ORDER — OXYTOCIN 40 UNITS IN LACTATED RINGERS INFUSION - SIMPLE MED
2.5000 [IU]/h | INTRAVENOUS | Status: DC
  Filled 2018-07-12: qty 1000

## 2018-07-12 MED ORDER — LACTATED RINGERS IV SOLN: 500.0000 mL | INTRAVENOUS | Status: DC | PRN

## 2018-07-12 MED ORDER — EPHEDRINE 5 MG/ML INJ
10.0000 mg | INTRAVENOUS | Status: DC | PRN
  Filled 2018-07-12: qty 2

## 2018-07-12 MED ORDER — LACTATED RINGERS IV SOLN
INTRAVENOUS | Status: DC
  Administered 2018-07-12 (×2): via INTRAVENOUS

## 2018-07-12 MED ORDER — LIDOCAINE HCL (PF) 1 % IJ SOLN
30.0000 mL | INTRAMUSCULAR | Status: DC | PRN
  Filled 2018-07-12: qty 30

## 2018-07-12 MED ORDER — OXYTOCIN BOLUS FROM INFUSION
500.0000 mL | Freq: Once | INTRAVENOUS | Status: AC
  Administered 2018-07-13: 500 mL via INTRAVENOUS

## 2018-07-12 MED ORDER — LIDOCAINE HCL (PF) 1 % IJ SOLN
INTRAMUSCULAR | Status: DC | PRN
  Administered 2018-07-12: 13 mL via EPIDURAL

## 2018-07-12 MED ORDER — PHENYLEPHRINE 40 MCG/ML (10ML) SYRINGE FOR IV PUSH (FOR BLOOD PRESSURE SUPPORT)
80.0000 ug | PREFILLED_SYRINGE | INTRAVENOUS | Status: DC | PRN
  Filled 2018-07-12: qty 5
  Filled 2018-07-12: qty 10

## 2018-07-12 MED ORDER — PHENYLEPHRINE 40 MCG/ML (10ML) SYRINGE FOR IV PUSH (FOR BLOOD PRESSURE SUPPORT)
80.0000 ug | PREFILLED_SYRINGE | INTRAVENOUS | Status: DC | PRN
  Filled 2018-07-12: qty 5

## 2018-07-12 NOTE — MAU Note (Signed)
Pt has been having ctx all day but getting stronger at 1645. Pain is 5/10. No LOF or bleeding + FM

## 2018-07-12 NOTE — Anesthesia Procedure Notes (Signed)
Epidural Patient location during procedure: OB Start time: 07/12/2018 8:47 PM End time: 07/12/2018 9:00 PM  Staffing Anesthesiologist: Lowella CurbMiller, Joanna Borawski Ray, MD Performed: anesthesiologist   Preanesthetic Checklist Completed: patient identified, site marked, surgical consent, pre-op evaluation, timeout performed, IV checked, risks and benefits discussed and monitors and equipment checked  Epidural Patient position: sitting Prep: ChloraPrep Patient monitoring: heart rate, cardiac monitor, continuous pulse ox and blood pressure Approach: midline Location: L2-L3 Injection technique: LOR saline  Needle:  Needle type: Tuohy  Needle gauge: 17 G Needle length: 9 cm Needle insertion depth: 4 cm Catheter type: closed end flexible Catheter size: 20 Guage Catheter at skin depth: 8 cm Test dose: negative  Assessment Events: blood not aspirated, injection not painful, no injection resistance, negative IV test and no paresthesia  Additional Notes Reason for block:procedure for pain

## 2018-07-12 NOTE — Progress Notes (Signed)
Patient ID: Margreta JourneyCheyenne C Mcgriff, female   DOB: 05-25-1996, 22 y.o.   MRN: [id number] Just got epidural, feeling better Vitals:   07/12/18 2105 07/12/18 2110 07/12/18 2115 07/12/18 2120  BP: 107/68 113/67 114/71 112/73  Pulse: 97 80 74 96  Resp: 18 16 17 16   Temp:      TempSrc:      SpO2: 98% 99% 99% 100%  Weight:       FHR stable UCs irregular, somewhat spaced out  Dilation: 4 Effacement (%): 70 Station: -3 Presentation: Vertex Exam by:: nicole druebbisch rn  Will consider amniotomy

## 2018-07-12 NOTE — H&P (Addendum)
OBSTETRIC ADMISSION HISTORY AND PHYSICAL  Kari [name] is a 22 y.o. female [phone] with IUP at 3120w4d presenting for painful bilateral lower abdominal contractions that started at 4:45pm, she has had previous fetal demise at 29w 6d. She reports +FMs. Denies LOF, VB, blurry vision, headaches, peripheral edema trace when sitting, or RUQ pain. She plans on breastfeeding. She requests depo for birth control. Circ outpatient  Dating: By US --->  Estimated Date of Delivery: 07/22/18  Sono:  @[redacted]w[redacted]d , CWD, normal anatomy, cephalic presentation,, >90th%ile, EFW 3815g   Prenatal History/Complications: Had a previous fetal demise at 3627w1d  Past Medical History: Past Medical History:  Diagnosis Date  . Anemia   . Anxiety   . Depression   . Headache    migrains  . IUFD (intrauterine fetal death) 2016    Past Surgical History: Past Surgical History:  Procedure Laterality Date  . APPENDECTOMY    . HERNIA REPAIR      Obstetrical History: OB History    Gravida  4   Para  2   Term  1   Preterm  1   AB  1   Living  1     SAB  1   TAB      Ectopic      Multiple  0   Live Births  1           Social History: Social History   Socioeconomic History  . Marital status: Single    Spouse name: Not on file  . Number of children: Not on file  . Years of education: Not on file  . Highest education level: Not on file  Occupational History  . Not on file  Social Needs  . Financial resource strain: Not on file  . Food insecurity:    Worry: Not on file    Inability: Not on file  . Transportation needs:    Medical: Not on file    Non-medical: Not on file  Tobacco Use  . Smoking status: Never Smoker  . Smokeless tobacco: Never Used  Substance and Sexual Activity  . Alcohol use: No  . Drug use: No  . Sexual activity: Not Currently    Birth control/protection: None  Lifestyle  . Physical activity:    Days per week: Not on file    Minutes per session: Not on file   . Stress: Not on file  Relationships  . Social connections:    Talks on phone: Not on file    Gets together: Not on file    Attends religious service: Not on file    Active member of club or organization: Not on file    Attends meetings of clubs or organizations: Not on file    Relationship status: Not on file  Other Topics Concern  . Not on file  Social History Narrative  . Not on file    Family History: Family History  Problem Relation Age of Onset  . Alcohol abuse Neg Hx   . Arthritis Neg Hx   . Asthma Neg Hx   . Birth defects Neg Hx   . Cancer Neg Hx   . COPD Neg Hx   . Depression Neg Hx   . Diabetes Neg Hx   . Drug abuse Neg Hx   . Early death Neg Hx   . Hearing loss Neg Hx   . Heart disease Neg Hx   . Hyperlipidemia Neg Hx   . Hypertension Neg Hx   .  Kidney disease Neg Hx   . Learning disabilities Neg Hx   . Mental illness Neg Hx   . Mental retardation Neg Hx   . Miscarriages / Stillbirths Neg Hx   . Stroke Neg Hx   . Vision loss Neg Hx   . Varicose Veins Neg Hx     Allergies: No Known Allergies  Medications Prior to Admission  Medication Sig Dispense Refill Last Dose  . Elastic Bandages & Supports (COMFORT FIT MATERNITY SUPP MED) MISC Maternity support belt for client.  Size appropriately for her 1 each 0 Taking  . Prenat-FeAsp-Meth-FA-DHA w/o A (PRENATE PIXIE) 10-0.6-0.4-200 MG CAPS Take 1 tablet by mouth daily. 30 capsule 12 Taking     Review of Systems   All systems reviewed and negative except as stated in HPI  Blood pressure 115/67, pulse 90, temperature 98.5 F (36.9 C), temperature source Oral, resp. rate 18, weight 58.1 kg (128 lb), last menstrual period 10/16/2017, SpO2 100 %, unknown if currently breastfeeding. General appearance: alert and cooperative Lungs: regular rate and effort Heart: regular rate  Abdomen: soft, non-tender Extremities: Homans sign is negative, no sign of DVT Presentation: cephalic Fetal monitoringBaseline: 160s  bpm, Variability: Good {> 6 bpm), Accelerations: Reactive and Decelerations: Late Uterine activityFrequency: Every 4 minutes Dilation: 4 Effacement (%): 70 Station: -3 Exam by:: nicole druebbisch rn   Prenatal labs: ABO, Rh: B/Positive/-- (01/24 1038) Antibody: Negative (01/24 1038) Rubella: 1.74 (01/24 1038) RPR: Non Reactive (05/02 1122)  HBsAg: Negative (01/24 1038)  HIV: Non Reactive (05/02 1122)  GBS: Negative (06/27 1331)  2 hr GTT neg  Prenatal Transfer Tool  Maternal Diabetes: No Genetic Screening: Declined Maternal Ultrasounds/Referrals: Normal Fetal Ultrasounds or other Referrals:  Other:  for regular BPPs due to previous uterine demise Maternal Substance Abuse:  No Significant Maternal Medications:  None Significant Maternal Lab Results: None  No results found for this or any previous visit (from the past 24 hour(s)).  Patient Active Problem List   Diagnosis Date Noted  . Vitamin D deficiency 01/22/2018  . Supervision of high risk pregnancy, antepartum 01/16/2018  . Ovarian cyst during pregnancy 07/18/2016  . History of IUFD 10/07/2015    Assessment: Kari [name] is a 22 y.o. [phone] at [redacted]w[redacted]d here for SOL latent phase of labour with Hx of previous fetal demise  1. Labor: Latent 2. FWB: Cat II 3. Pain: Mod 4. GBS: Neg   Plan: Admit to L&D for expectant management  Consider augmentation of labor  Close fetal monitoring  Expectant management   Sandi Raveling, MD  07/12/2018, 7:11 PM   RESIDENT ADDENDUM I have separately seen and examined the patient. I have discussed the findings and exam with the medical student and agree with the above note. I helped develop the management plan that is described in the student's note, and I agree with the content.   A/P: Patient reports history of 8 hour delivery Requesting epidural upon arrival in [facility] Anticipate NSVD  Boy/Breast/Outpatient circ/Depo  Clayton Bibles, CNM 07/12/18  7:56  PM

## 2018-07-12 NOTE — Progress Notes (Signed)
Patient ID: Kari [name], female   DOB: 1996-12-12, 22 y.o.   MRN: [id number] Comfortable  Vitals:   07/12/18 2200 07/12/18 2214 07/12/18 2230 07/12/18 2300  BP: 116/69  114/77 110/69  Pulse: 92  96 91  Resp: 16  16 16   Temp:      TempSrc:      SpO2: 100%  100% 100%  Weight:  128 lb (58.1 kg)    Height:  5\' 2"  (1.575 m)     FHR reactive with early decels UCs every 3-4 min  Dilation: 6 Effacement (%): 80 Cervical Position: Anterior Station: -2, -1 Presentation: Vertex Exam by:: Laya Letendre CNM  AROM clear fluid  Anticipate SVD

## 2018-07-12 NOTE — Progress Notes (Addendum)
Assumed care of pt after receiving report from RN Ernestene KielNicole D.. Provider Alvis at bs assessing pt.   G4P1 @ 38.[redacted] wksga. R/o labor. Being admitted dt to previoius IUFD @ 30wlsga. Denies LOF or bleeding. No complication with pregnancy. +FM noted on EFM.   GBS neg  fhr 140  Pending report to birthing charge nurse dt shift change.  1925: labs drawn and IV started. All labs and blood band verified and reverified with pt.   Desires epidural.   1932: Birthing charge notified. Report given. Room assigned to 163  Provider at bs.   1940: Pt to birthing via wheelchair.

## 2018-07-12 NOTE — Anesthesia Preprocedure Evaluation (Signed)
Anesthesia Evaluation  Patient identified by MRN, date of birth, ID band Patient awake    Airway Mallampati: I       Dental  (+) Teeth Intact   Pulmonary neg pulmonary ROS,    breath sounds clear to auscultation       Cardiovascular negative cardio ROS   Rhythm:Regular Rate:Normal     Neuro/Psych  Headaches, PSYCHIATRIC DISORDERS Anxiety Depression    GI/Hepatic negative GI ROS, Neg liver ROS,   Endo/Other  negative endocrine ROS  Renal/GU negative Renal ROS  negative genitourinary   Musculoskeletal negative musculoskeletal ROS (+)   Abdominal   Peds negative pediatric ROS (+)  Hematology negative hematology ROS (+)   Anesthesia Other Findings   Reproductive/Obstetrics (+) Pregnancy                             Lab Results  Component Value Date   WBC 9.8 07/12/2018   HGB 10.5 (L) 07/12/2018   HCT 31.2 (L) 07/12/2018   MCV 73.9 (L) 07/12/2018   PLT 143 (L) 07/12/2018     Anesthesia Physical  Anesthesia Plan  ASA: II  Anesthesia Plan: Epidural   Post-op Pain Management:    Induction:   PONV Risk Score and Plan:   Airway Management Planned:   Additional Equipment:   Intra-op Plan:   Post-operative Plan:   Informed Consent: I have reviewed the patients History and Physical, chart, labs and discussed the procedure including the risks, benefits and alternatives for the proposed anesthesia with the patient or authorized representative who has indicated his/her understanding and acceptance.     Plan Discussed with:   Anesthesia Plan Comments:         Anesthesia Quick Evaluation

## 2018-07-13 ENCOUNTER — Encounter (HOSPITAL_COMMUNITY): Payer: Self-pay

## 2018-07-13 DIAGNOSIS — Z3483 Encounter for supervision of other normal pregnancy, third trimester: Secondary | ICD-10-CM | POA: Diagnosis not present

## 2018-07-13 DIAGNOSIS — Z3A38 38 weeks gestation of pregnancy: Secondary | ICD-10-CM | POA: Diagnosis not present

## 2018-07-13 LAB — RPR: RPR Ser Ql: NONREACTIVE

## 2018-07-13 MED ORDER — BENZOCAINE-MENTHOL 20-0.5 % EX AERO: 1.0000 "application " | INHALATION_SPRAY | CUTANEOUS | Status: DC | PRN

## 2018-07-13 MED ORDER — COCONUT OIL OIL
1.0000 "application " | TOPICAL_OIL | Status: DC | PRN
  Administered 2018-07-14: 1 via TOPICAL
  Filled 2018-07-13: qty 120

## 2018-07-13 MED ORDER — SENNOSIDES-DOCUSATE SODIUM 8.6-50 MG PO TABS
2.0000 | ORAL_TABLET | ORAL | Status: DC
  Administered 2018-07-13 – 2018-07-14 (×2): 2 via ORAL
  Filled 2018-07-13 (×2): qty 2

## 2018-07-13 MED ORDER — ZOLPIDEM TARTRATE 5 MG PO TABS: 5.0000 mg | ORAL_TABLET | Freq: Every evening | ORAL | Status: DC | PRN

## 2018-07-13 MED ORDER — ONDANSETRON HCL 4 MG/2ML IJ SOLN: 4.0000 mg | INTRAMUSCULAR | Status: DC | PRN

## 2018-07-13 MED ORDER — TETANUS-DIPHTH-ACELL PERTUSSIS 5-2.5-18.5 LF-MCG/0.5 IM SUSP: 0.5000 mL | Freq: Once | INTRAMUSCULAR | Status: DC

## 2018-07-13 MED ORDER — PRENATAL MULTIVITAMIN CH
1.0000 | ORAL_TABLET | Freq: Every day | ORAL | Status: DC
  Administered 2018-07-13 – 2018-07-15 (×3): 1 via ORAL
  Filled 2018-07-13 (×3): qty 1

## 2018-07-13 MED ORDER — DIPHENHYDRAMINE HCL 25 MG PO CAPS: 25.0000 mg | ORAL_CAPSULE | Freq: Four times a day (QID) | ORAL | Status: DC | PRN

## 2018-07-13 MED ORDER — ACETAMINOPHEN 325 MG PO TABS: 650.0000 mg | ORAL_TABLET | ORAL | Status: DC | PRN

## 2018-07-13 MED ORDER — ONDANSETRON HCL 4 MG PO TABS: 4.0000 mg | ORAL_TABLET | ORAL | Status: DC | PRN

## 2018-07-13 MED ORDER — WITCH HAZEL-GLYCERIN EX PADS: 1.0000 "application " | MEDICATED_PAD | CUTANEOUS | Status: DC | PRN

## 2018-07-13 MED ORDER — DIBUCAINE 1 % RE OINT: 1.0000 "application " | TOPICAL_OINTMENT | RECTAL | Status: DC | PRN

## 2018-07-13 MED ORDER — SIMETHICONE 80 MG PO CHEW: 80.0000 mg | CHEWABLE_TABLET | ORAL | Status: DC | PRN

## 2018-07-13 MED ORDER — IBUPROFEN 600 MG PO TABS
600.0000 mg | ORAL_TABLET | Freq: Four times a day (QID) | ORAL | Status: DC
  Administered 2018-07-13 – 2018-07-15 (×10): 600 mg via ORAL
  Filled 2018-07-13 (×11): qty 1

## 2018-07-13 NOTE — Anesthesia Postprocedure Evaluation (Signed)
Anesthesia Post Note  Patient: Kari [name]  Procedure(s) Performed: AN AD HOC LABOR EPIDURAL     Patient location during evaluation: Mother Baby Anesthesia Type: Epidural Level of consciousness: awake and alert Pain management: pain level controlled Vital Signs Assessment: post-procedure vital signs reviewed and stable Respiratory status: spontaneous breathing, nonlabored ventilation and respiratory function stable Cardiovascular status: stable Postop Assessment: no headache, no backache, epidural receding, able to ambulate, adequate PO intake, no apparent nausea or vomiting and patient able to bend at knees Anesthetic complications: no    Last Vitals:  Vitals:   07/13/18 0250 07/13/18 0402  BP: 115/67 109/74  Pulse: 73 61  Resp: 18 18  Temp: 36.9 C 36.9 C  SpO2:      Last Pain:  Vitals:   07/13/18 0402  TempSrc: Oral  PainSc:    Pain Goal:                 Laban EmperorMalinova,Taysha Majewski Hristova

## 2018-07-13 NOTE — Lactation Note (Signed)
This note was copied from a baby's chart. Lactation Consultation Note  Patient Name: [name] FAOZH'YToday's Date: 07/13/2018   P2 mother whose infant is now 5115 hours old.  Family sleeping; will attempt to return later today.      Meriah Shands R Albaro Deviney 07/13/2018, 4:39 PM

## 2018-07-14 ENCOUNTER — Other Ambulatory Visit: Payer: Self-pay

## 2018-07-14 ENCOUNTER — Encounter (HOSPITAL_COMMUNITY): Payer: Self-pay

## 2018-07-14 ENCOUNTER — Encounter: Payer: 59 | Admitting: Obstetrics and Gynecology

## 2018-07-14 NOTE — Progress Notes (Addendum)
Patient ID: Margreta JourneyCheyenne C Trick, female   DOB: 10-Apr-1996, 22 y.o.   MRN: [id number] POSTPARTUM PROGRESS NOTE  Post Partum Day #1 Subjective:  Margreta JourneyCheyenne C Minnis is a [age] y.o. [id number] 872w5d s/p SVD following SOL.  No acute events overnight.  Pt denies problems with ambulating, voiding or po intake.  She denies nausea or vomiting.  Pain is well controlled.  Lochia Minimal.   Objective: Blood pressure 99/67, pulse 82, temperature 98.2 F (36.8 C), resp. rate 18, height 5\' 2"  (1.575 m), weight 58.1 kg (128 lb), last menstrual period 10/16/2017, SpO2 98 %, unknown if currently breastfeeding.  Physical Exam:  General: alert, cooperative and no distress Lochia:normal flow Chest: no respiratory distress Heart:regular rate, distal pulses intact Abdomen: soft, nontender,  Uterine Fundus: firm, appropriately tender DVT Evaluation: No calf swelling or tenderness Extremities: No pitting edema  Recent Labs    07/12/18 1925  HGB 10.5*  HCT 31.2*    Assessment/Plan:  ASSESSMENT: Margreta JourneyCheyenne C Blaszczyk is a 22 y.o. [id number] 4372w5d s/p SVD following SOL.   Discharge home, Breastfeeding and Contraception Depo   LOS: 1 day   Rolland BimlerLexi J Betancourt MS3 07/14/2018, 7:44 AM   OB FELLOW MEDICAL STUDENT NOTE ATTESTATION  I confirm that I have verified the information documented in the medical student's note and that I have also personally performed the physical exam and all medical decision making activities.  Pt doing well. No concerns.  VS wnl. [employer]. Dispo: pending baby's discharge  Frederik PearJulie P Degele, MD OB Fellow 07/14/2018, 10:45 AM

## 2018-07-14 NOTE — Lactation Note (Signed)
This note was copied from a baby's chart. Lactation Consultation Note  Patient Name: Kari [name]'UToday's Date: 07/14/2018 Reason for consult: Initial assessment  Lactation visit at 41 hours of life. Mom seems to have "rusty pipes" in her R breast. I explained that this was normal & should self-resolve.   Mom reports that her other child (now a toddler) did not latch, so she pumped & BO for 2 months. This infant has gone to the breast many times & the last LS (by the RN) was a 10. Mom has also already given 1 bottle with a small amount of EBM. I suggested that we could cup feed infant instead of bottle feeding, so as not to interfere with how this infant is latching. Mom verbalized understanding.   Mom was made aware of O/P services, breastfeeding support groups, community resources, and our phone # for post-discharge questions.    [name], Sophia Cubero [facility] 07/14/2018, 6:08 PM

## 2018-07-14 NOTE — Progress Notes (Signed)
CSW acknowledges consult.  CSW attempted to meet with MOB, however [facility] and [facility].  CSW will attempt to visit with MOB on tomorrow.   [name], MSW, LCSW Clinical Social Work [phone]

## 2018-07-14 NOTE — Progress Notes (Signed)
CSW acknowledges consult.  When CSW arrived, MOB was co-sleeping with infant.  MOB was easily awaken and CSW placed infant in bassinet.  CSW provided brief education about SIDS.  MOB requested that CSW return after MOB's nap; CSW agreed. CSW will attempt to visit with MOB at a later time.   [name], MSW, LCSW Clinical Social Work [phone]

## 2018-07-15 ENCOUNTER — Other Ambulatory Visit (HOSPITAL_COMMUNITY): Payer: Self-pay | Admitting: Obstetrics and Gynecology

## 2018-07-15 DIAGNOSIS — Z8759 Personal history of other complications of pregnancy, childbirth and the puerperium: Secondary | ICD-10-CM

## 2018-07-15 DIAGNOSIS — Z3A36 36 weeks gestation of pregnancy: Secondary | ICD-10-CM

## 2018-07-15 MED ORDER — IBUPROFEN 600 MG PO TABS: 600.0000 mg | ORAL_TABLET | Freq: Four times a day (QID) | ORAL | 0 refills | Status: DC

## 2018-07-15 MED ORDER — ACETAMINOPHEN 325 MG PO TABS: 650.0000 mg | ORAL_TABLET | ORAL | 0 refills | Status: DC | PRN

## 2018-07-15 NOTE — Discharge Instructions (Signed)
Vaginal Delivery, Care After °Refer to this sheet in the next few weeks. These instructions provide you with information about caring for yourself after vaginal delivery. Your health care provider may also give you more specific instructions. Your treatment has been planned according to current medical practices, but problems sometimes occur. Call your health care provider if you have any problems or questions. °What can I expect after the procedure? °After vaginal delivery, it is common to have: °· Some bleeding from your vagina. °· Soreness in your abdomen, your vagina, and the area of skin between your vaginal opening and your anus (perineum). °· Pelvic cramps. °· Fatigue. ° °Follow these instructions at home: °Medicines °· Take over-the-counter and prescription medicines only as told by your health care provider. °· If you were prescribed an antibiotic medicine, take it as told by your health care provider. Do not stop taking the antibiotic until it is finished. °Driving ° °· Do not drive or operate heavy machinery while taking prescription pain medicine. °· Do not drive for 24 hours if you received a sedative. °Lifestyle °· Do not drink alcohol. This is especially important if you are breastfeeding or taking medicine to relieve pain. °· Do not use tobacco products, including cigarettes, chewing tobacco, or e-cigarettes. If you need help quitting, ask your health care provider. °Eating and drinking °· Drink at least 8 eight-ounce glasses of water every day unless you are told not to by your health care provider. If you choose to breastfeed your baby, you may need to drink more water than this. °· Eat high-fiber foods every day. These foods may help prevent or relieve constipation. High-fiber foods include: °? Whole grain cereals and breads. °? Brown rice. °? Beans. °? Fresh fruits and vegetables. °Activity °· Return to your normal activities as told by your health care provider. Ask your health care provider  what activities are safe for you. °· Rest as much as possible. Try to rest or take a nap when your baby is sleeping. °· Do not lift anything that is heavier than your baby or 10 lb (4.5 kg) until your health care provider says that it is safe. °· Talk with your health care provider about when you can engage in sexual activity. This may depend on your: °? Risk of infection. °? Rate of healing. °? Comfort and desire to engage in sexual activity. °Vaginal Care °· If you have an episiotomy or a vaginal tear, check the area every day for signs of infection. Check for: °? More redness, swelling, or pain. °? More fluid or blood. °? Warmth. °? Pus or a bad smell. °· Do not use tampons or douches until your health care provider says this is safe. °· Watch for any blood clots that may pass from your vagina. These may look like clumps of dark red, brown, or black discharge. °General instructions °· Keep your perineum clean and dry as told by your health care provider. °· Wear loose, comfortable clothing. °· Wipe from front to back when you use the toilet. °· Ask your health care provider if you can shower or take a bath. If you had an episiotomy or a perineal tear during labor and delivery, your health care provider may tell you not to take baths for a certain length of time. °· Wear a bra that supports your breasts and fits you well. °· If possible, have someone help you with household activities and help care for your baby for at least a few days after   you leave the hospital. °· Keep all follow-up visits for you and your baby as told by your health care provider. This is important. °Contact a health care provider if: °· You have: °? Vaginal discharge that has a bad smell. °? Difficulty urinating. °? Pain when urinating. °? A sudden increase or decrease in the frequency of your bowel movements. °? More redness, swelling, or pain around your episiotomy or vaginal tear. °? More fluid or blood coming from your episiotomy or  vaginal tear. °? Pus or a bad smell coming from your episiotomy or vaginal tear. °? A fever. °? A rash. °? Little or no interest in activities you used to enjoy. °? Questions about caring for yourself or your baby. °· Your episiotomy or vaginal tear feels warm to the touch. °· Your episiotomy or vaginal tear is separating or does not appear to be healing. °· Your breasts are painful, hard, or turn red. °· You feel unusually sad or worried. °· You feel nauseous or you vomit. °· You pass large blood clots from your vagina. If you pass a blood clot from your vagina, save it to show to your health care provider. Do not flush blood clots down the toilet without having your health care provider look at them. °· You urinate more than usual. °· You are dizzy or light-headed. °· You have not breastfed at all and you have not had a menstrual period for 12 weeks after delivery. °· You have stopped breastfeeding and you have not had a menstrual period for 12 weeks after you stopped breastfeeding. °Get help right away if: °· You have: °? Pain that does not go away or does not get better with medicine. °? Chest pain. °? Difficulty breathing. °? Blurred vision or spots in your vision. °? Thoughts about hurting yourself or your baby. °· You develop pain in your abdomen or in one of your legs. °· You develop a severe headache. °· You faint. °· You bleed from your vagina so much that you fill two sanitary pads in one hour. °This information is not intended to replace advice given to you by your health care provider. Make sure you discuss any questions you have with your health care provider. °Document Released: 12/07/2000 Document Revised: 05/23/2016 Document Reviewed: 12/25/2015 °Elsevier Interactive Patient Education © 2018 [employer] ° °

## 2018-07-15 NOTE — Lactation Note (Signed)
This note was copied from a baby's chart. Lactation Consultation Note  Patient Name: Boy Lattie HawCheyenne Gemmer ZOXWR'UToday's Date: 07/15/2018 Reason for consult: Follow-up assessment Mom is mostly pumping and bottle feeding.  Milk is in and she is obtaining good amounts of milk.  Mom reports that breasts are comfortable.  Pumping every 2 1/2-3 hours.  She has a pump a home.  No questions at present time.  Lactation outpatient services and support information reviewed and encouraged.  Maternal Data    Feeding Feeding Type: Breast Milk Nipple Type: Slow - flow  LATCH Score                   Interventions    Lactation Tools Discussed/Used     Consult Status Consult Status: Complete Follow-up type: Call as needed    Huston FoleyMOULDEN, Rashea Hoskie S 07/15/2018, 9:38 AM

## 2018-07-15 NOTE — Progress Notes (Signed)
CSW received consult due to score 15 on Edinburgh Depression Screen and hx of depression.   When CSW arrived, MOB was pumping and infant was asleep in bassinet.  MOB was polite, honest, and easy to engage.   CSW reviewed MOB's EDPS score and MOB expressed feeling overwhelmed prior to delivery.  However, MOB shared that currently MOB feels good and is excited about being a new mom again.  MOB acknowledged her loss a couple yeas ago and communicated the effects it has had on MOB's life.  CSW validated and normalized her feelings and expressed the appropriateness of depressive symtoms as it relates to grief and loss.   CSW provided education regarding Baby Blues vs PMADs. CSW encouraged MOB to evaluate her mental health throughout the postpartum period with the use of the New Mom Checklist developed by Postpartum Progress and notify a medical professional if symptoms arise.  CSW assessed for safety and MOB denied HI, SI, and DV. MOB reported having a great support team that consists of MOB and [name]'s family.   MOB also reported having all essentials need for infant. CSW offered outpatient counseling resources and MOB declined.   There are no barriers to discharge.  Zayne Marovich Boyd-Gilyard, MSW, LCSW Clinical Social Work [phone]    

## 2018-07-15 NOTE — Discharge Summary (Signed)
OB Discharge Summary     Patient Name: Kari [name] DOB: 1996/03/02 MRN: [id number]  Date of admission: 07/12/2018 Delivering MD: Aviva Signs   Date of discharge: 07/15/2018  Admitting diagnosis: 38.5 WKS, CTXS, PRESSURE Intrauterine pregnancy: [redacted]w[redacted]d     Secondary diagnosis:  Principal Problem:   Normal labor Active Problems:   History of IUFD   Vaginal delivery  Additional problems: Previous fetal losses      Discharge diagnosis: 38.5wk5d                                                                                                Post partum procedures:None  Augmentation: AROM  Complications: None  Hospital course:  Onset of Labor With Vaginal Delivery     22 y.o. yo [id number] at [redacted]w[redacted]d was admitted in Active Labor on 07/12/2018. Patient had an uncomplicated labor course as follows:  Membrane Rupture Time/Date: 11:27 PM ,07/12/2018   Intrapartum Procedures: Episiotomy: None [1]                                         Lacerations:  None [1]  Patient had a delivery of a viable infant. 07/13/2018  Information for the patient's newborn:  Katharin, Schneider [[id number]]  Delivery Method: Vag-Spont    Pateint had an uncomplicated postpartum course.  She is ambulating, tolerating a regular diet, passing flatus, and urinating well. Patient is discharged home in stable condition on 07/15/18.   Physical exam  Vitals:   07/13/18 2151 07/14/18 0550 07/14/18 1358 07/15/18 0734  BP: 108/60 99/67 108/65 112/74  Pulse: 89 82    Resp: 18 18 16 16   Temp: 98.4 F (36.9 C) 98.2 F (36.8 C) 98 F (36.7 C) 98.1 F (36.7 C)  TempSrc: Oral  Oral Oral  SpO2: 98%  100%   Weight:      Height:       General: alert and cooperative Lochia: appropriate Uterine Fundus: firm Incision: N/A DVT Evaluation: No evidence of DVT seen on physical exam. No cords or calf tenderness. Labs: Lab Results  Component Value Date   WBC 9.8 07/12/2018   HGB 10.5 (L) 07/12/2018   HCT  31.2 (L) 07/12/2018   MCV 73.9 (L) 07/12/2018   PLT 143 (L) 07/12/2018   CMP Latest Ref Rng & Units 09/02/2017  Glucose 65 - 99 mg/dL 86  BUN 6 - 20 mg/dL 15  Creatinine 4.13 - 2.44 mg/dL 0.10  Sodium 272 - 536 mmol/L 139  Potassium 3.5 - 5.1 mmol/L 3.9  Chloride 101 - 111 mmol/L 107  CO2 22 - 32 mmol/L 27  Calcium 8.9 - 10.3 mg/dL 9.2  Total Protein 6.5 - 8.1 g/dL 6.8  Total Bilirubin 0.3 - 1.2 mg/dL 0.6  Alkaline Phos 38 - 126 U/L 88  AST 15 - 41 U/L 17  ALT 14 - 54 U/L 11(L)    Discharge instruction: per After Visit Summary and "Baby and Me Booklet".  After visit meds:  Allergies as  of 07/15/2018   No Known Allergies     Medication List    TAKE these medications   acetaminophen 325 MG tablet Commonly known as:  TYLENOL Take 2 tablets (650 mg total) by mouth every 4 (four) hours as needed (for pain scale < 4).   COMFORT FIT MATERNITY SUPP MED Misc Maternity support belt for client.  Size appropriately for her   ibuprofen 600 MG tablet Commonly known as:  ADVIL,MOTRIN Take 1 tablet (600 mg total) by mouth every 6 (six) hours.   PRENATE PIXIE 10-0.6-0.4-200 MG Caps Take 1 tablet by mouth daily.       Diet: regular  Activity: Advance as tolerated. Pelvic rest for 6 weeks.   Outpatient follow up:4weeks Follow up Appt: Future Appointments  Date Time Provider Department Center  08/11/2018  1:00 PM Calvert CantorWeinhold, Samantha C, CNM [facility] None   Follow up Visit:No follow-ups on file.  Postpartum contraception: Depo Provera  Newborn Data: Live born female  Birth Weight: 8 lb 3.8 oz (3735 g) APGAR: 6, 9  Newborn Delivery   Birth date/time:  07/13/2018 00:45:00 Delivery type:  Vaginal, Spontaneous     Baby Feeding: Breast Disposition:home with mother   07/15/2018 Sandi RavelingAnson B Svara Twyman, MD

## 2018-07-15 NOTE — Progress Notes (Signed)
CSW attempted to meet with MOB this morning.  When CSW arrived, MOB, [name] and infant were asleep.  CSW will attempt to meet with family again prior to discharge.    Yosselyn Tax Boyd-Gilyard, MSW, LCSW Clinical Social Work [phone] 

## 2018-07-17 ENCOUNTER — Ambulatory Visit (HOSPITAL_COMMUNITY): Payer: 59

## 2018-08-11 ENCOUNTER — Ambulatory Visit: Payer: 59 | Admitting: Advanced Practice Midwife

## 2018-08-21 ENCOUNTER — Ambulatory Visit (INDEPENDENT_AMBULATORY_CARE_PROVIDER_SITE_OTHER): Payer: 59 | Admitting: Obstetrics and Gynecology

## 2018-08-21 ENCOUNTER — Encounter: Payer: Self-pay | Admitting: Obstetrics and Gynecology

## 2018-08-21 DIAGNOSIS — Z1389 Encounter for screening for other disorder: Secondary | ICD-10-CM | POA: Diagnosis not present

## 2018-08-21 MED ORDER — MEDROXYPROGESTERONE ACETATE 150 MG/ML IM SUSP: 150.0000 mg | INTRAMUSCULAR | 3 refills | Status: DC

## 2018-08-21 NOTE — Progress Notes (Signed)
Obstetrics and Gynecology Postpartum Visit  Appointment Date: 08/21/2018  OBGYN Clinic: [facility]  Primary Care Provider: Shaune PollackGates, Donna  Chief Complaint:  Chief Complaint  Patient presents with  . Postpartum Care    History of Present Illness: Kari [name] is a 22 y.o. African-American [phone] (Patient's last menstrual period was 08/19/2018.), seen for the above chief complaint. She is s/p 7/21 SVD/intact perineum. Pt admitted for active labor and discharged on PPD#2. Pt desires depo today.   Vaginal bleeding or discharge: period started earlier this eek Breast or formula feeding: plans to breastfeed Intercourse: No  PP depression s/s: No  Pap smear: no abnormalities (date: 12/2017)  Review of Systems:  as noted in the History of Present Illness.  Patient Active Problem List   Diagnosis Date Noted  . Vaginal delivery 07/13/2018  . Normal labor 07/12/2018  . Vitamin D deficiency 01/22/2018  . Supervision of high risk pregnancy, antepartum 01/16/2018  . Ovarian cyst during pregnancy 07/18/2016  . History of IUFD 10/07/2015    Medications Adyline C. Stelzner had no medications administered during this visit. Current Outpatient Medications  Medication Sig Dispense Refill  . acetaminophen (TYLENOL) 325 MG tablet Take 2 tablets (650 mg total) by mouth every 4 (four) hours as needed (for pain scale < 4). 30 tablet 0  . Elastic Bandages & Supports (COMFORT FIT MATERNITY SUPP MED) MISC Maternity support belt for client.  Size appropriately for her (Patient not taking: Reported on 08/21/2018) 1 each 0  . ibuprofen (ADVIL,MOTRIN) 600 MG tablet Take 1 tablet (600 mg total) by mouth every 6 (six) hours. (Patient not taking: Reported on 08/21/2018) 30 tablet 0  . Prenat-FeAsp-Meth-FA-DHA w/o A (PRENATE PIXIE) 10-0.6-0.4-200 MG CAPS Take 1 tablet by mouth daily. (Patient not taking: Reported on 08/21/2018) 30 capsule 12   No current facility-administered medications for this visit.      Allergies Patient has no known allergies.  Physical Exam:  BP 111/70   Pulse (!) 59   Ht 5\' 2"  (1.575 m)   Wt 112 lb 12.8 oz (51.2 kg)   LMP 08/19/2018   Breastfeeding? Yes   BMI 20.63 kg/m  Body mass index is 20.63 kg/m. General appearance: Well nourished, well developed female in no acute distress.  Respiratory:  Normal respiratory effort Abdomen: positive bowel sounds and no masses, hernias; diffusely non tender to palpation, non distended Neuro/Psych:  Normal mood and affect.  Skin:  Warm and dry.   Laboratory: UPT negative  PP Depression Screening:  epds score 6  Assessment: pt doing well  Plan:  Routine care. Desires depo but none in the office today. Rx sent in and pt to come back tomorrow for it. D/w her to wait a week before considering it effective.   RTC 1318m for repeat depo  Cornelia Copaharlie Jhoan Schmieder, Jr MD Attending Center for [facility](Faculty Practice)

## 2018-08-21 NOTE — Progress Notes (Signed)
Pt would like depo shot for contraception.

## 2018-08-22 ENCOUNTER — Ambulatory Visit (INDEPENDENT_AMBULATORY_CARE_PROVIDER_SITE_OTHER): Payer: 59

## 2018-08-22 DIAGNOSIS — Z3042 Encounter for surveillance of injectable contraceptive: Secondary | ICD-10-CM

## 2018-08-22 MED ORDER — MEDROXYPROGESTERONE ACETATE 150 MG/ML IM SUSP
150.0000 mg | Freq: Once | INTRAMUSCULAR | Status: AC
  Administered 2018-08-22: 150 mg via INTRAMUSCULAR

## 2018-08-22 NOTE — Progress Notes (Signed)
Nurse visit for initial Depo. Inj. given L Del w/o difficulty. Next Depo due Nov 15-29, pt agrees.

## 2018-10-15 ENCOUNTER — Encounter (HOSPITAL_COMMUNITY): Payer: Self-pay | Admitting: Emergency Medicine

## 2018-10-15 ENCOUNTER — Other Ambulatory Visit: Payer: Self-pay

## 2018-10-15 ENCOUNTER — Ambulatory Visit (HOSPITAL_COMMUNITY)
Admission: EM | Admit: 2018-10-15 | Discharge: 2018-10-15 | Disposition: A | Discharge status: Home / Self Care | Payer: 59 | Attending: Family Medicine | Admitting: Family Medicine

## 2018-10-15 DIAGNOSIS — R519 Headache, unspecified: Secondary | ICD-10-CM

## 2018-10-15 DIAGNOSIS — R51 Headache: Secondary | ICD-10-CM

## 2018-10-15 DIAGNOSIS — J320 Chronic maxillary sinusitis: Secondary | ICD-10-CM

## 2018-10-15 MED ORDER — DEXAMETHASONE SODIUM PHOSPHATE 10 MG/ML IJ SOLN
INTRAMUSCULAR | Status: AC
Start: 2018-10-15 — End: ?
  Filled 2018-10-15: qty 1

## 2018-10-15 MED ORDER — DEXAMETHASONE SODIUM PHOSPHATE 10 MG/ML IJ SOLN
10.0000 mg | Freq: Once | INTRAMUSCULAR | Status: AC
  Administered 2018-10-15: 10 mg via INTRAMUSCULAR

## 2018-10-15 MED ORDER — AMOXICILLIN 875 MG PO TABS: 875.0000 mg | ORAL_TABLET | Freq: Two times a day (BID) | ORAL | 0 refills | Status: DC

## 2018-10-15 NOTE — ED Triage Notes (Signed)
Headache for the last hour.  Patient had a similar headache yesterday.  Patient reports a cough, cold and runny nose

## 2018-10-15 NOTE — ED Provider Notes (Signed)
[facility]    CSN: [id number] Arrival date & time: 10/15/18  1445     History   Chief Complaint Chief Complaint  Patient presents with  . Headache    HPI Kari [name] is a 22 y.o. female.   This 22 year old female is making her first visit to the [facility] urgent care center.Headache for the last hour.  Patient had a similar headache yesterday.  Patient reports a cough, cold and runny nose for over a week.  Cough is better but ears and sinuses are full with frontal h/a.  Patient denies productive cough.  There is no hemoptysis or epistaxis.  No fever either.  Patient does hair and works for herself.     Past Medical History:  Diagnosis Date  . Anemia   . Anxiety   . Depression   . Headache    migrains  . IUFD (intrauterine fetal death) 05/28/15    Patient Active Problem List   Diagnosis Date Noted  . Vitamin D deficiency 01/22/2018  . History of IUFD 10/07/2015    Past Surgical History:  Procedure Laterality Date  . APPENDECTOMY    . HERNIA REPAIR      OB History    Gravida  4   Para  3   Term  2   Preterm  1   AB  1   Living  2     SAB  1   TAB      Ectopic      Multiple  0   Live Births  2            Home Medications    Prior to Admission medications   Medication Sig Start Date End Date Taking? Authorizing Provider  acetaminophen (TYLENOL) 325 MG tablet Take 2 tablets (650 mg total) by mouth every 4 (four) hours as needed (for pain scale < 4). 07/15/18   Sandi Raveling, MD  amoxicillin (AMOXIL) 875 MG tablet Take 1 tablet (875 mg total) by mouth 2 (two) times daily. 10/15/18   Elvina Sidle, MD  medroxyPROGESTERone (DEPO-PROVERA) 150 MG/ML injection Inject 1 mL (150 mg total) into the muscle every 3 (three) months. 08/21/18   [place] Bing, MD    Family History Family History  Problem Relation Age of Onset  . Alcohol abuse Neg Hx   . Arthritis Neg Hx   . Asthma Neg Hx   . Birth  defects Neg Hx   . Cancer Neg Hx   . COPD Neg Hx   . Depression Neg Hx   . Diabetes Neg Hx   . Drug abuse Neg Hx   . Early death Neg Hx   . Hearing loss Neg Hx   . Heart disease Neg Hx   . Hyperlipidemia Neg Hx   . Hypertension Neg Hx   . Kidney disease Neg Hx   . Learning disabilities Neg Hx   . Mental illness Neg Hx   . Mental retardation Neg Hx   . Miscarriages / Stillbirths Neg Hx   . Stroke Neg Hx   . Vision loss Neg Hx   . Varicose Veins Neg Hx     Social History Social History   Tobacco Use  . Smoking status: Never Smoker  . Smokeless tobacco: Never Used  Substance Use Topics  . Alcohol use: No  . Drug use: No     Allergies   Patient has no known allergies.   Review of Systems Review  of Systems   Physical Exam Triage Vital Signs ED Triage Vitals  Enc Vitals Group     BP 10/15/18 1521 108/68     Pulse Rate 10/15/18 1521 91     Resp 10/15/18 1521 18     Temp 10/15/18 1521 98.7 F (37.1 C)     Temp Source 10/15/18 1521 Oral     SpO2 10/15/18 1521 100 %     Weight --      Height --      Head Circumference --      Peak Flow --      Pain Score 10/15/18 1517 10     Pain Loc --      Pain Edu? --      Excl. in GC? --    No data found.  Updated Vital Signs BP 108/68 (BP Location: Right Arm)   Pulse 91   Temp 98.7 F (37.1 C) (Oral)   Resp 18   LMP 08/15/2018   SpO2 100%    Physical Exam   UC Treatments / Results  Labs (all labs ordered are listed, but only abnormal results are displayed) Labs Reviewed - No data to display  EKG None  Radiology No results found.  Procedures Procedures (including critical care time)  Medications Ordered in UC Medications  dexamethasone (DECADRON) injection 10 mg (has no administration in time range)    Initial Impression / Assessment and Plan / UC Course  I have reviewed the triage vital signs and the nursing notes.  Pertinent labs & imaging results that were available during my care of the  patient were reviewed by me and considered in my medical decision making (see chart for details).    Final Clinical Impressions(s) / UC Diagnoses   Final diagnoses:  Chronic maxillary sinusitis  Frontal headache   Discharge Instructions   None    ED Prescriptions    Medication Sig Dispense Auth. Provider   amoxicillin (AMOXIL) 875 MG tablet Take 1 tablet (875 mg total) by mouth 2 (two) times daily. 20 tablet Elvina Sidle, MD     Controlled Substance Prescriptions [place] Controlled Substance Registry consulted? Not Applicable   Elvina Sidle, MD 10/15/18 1530

## 2018-11-03 ENCOUNTER — Other Ambulatory Visit: Payer: Self-pay

## 2018-11-03 ENCOUNTER — Ambulatory Visit (HOSPITAL_COMMUNITY)
Admission: EM | Admit: 2018-11-03 | Discharge: 2018-11-03 | Disposition: A | Discharge status: Home / Self Care | Payer: 59 | Attending: Family Medicine | Admitting: Family Medicine

## 2018-11-03 ENCOUNTER — Encounter (HOSPITAL_COMMUNITY): Payer: Self-pay

## 2018-11-03 DIAGNOSIS — H66003 Acute suppurative otitis media without spontaneous rupture of ear drum, bilateral: Secondary | ICD-10-CM

## 2018-11-03 MED ORDER — AMOXICILLIN-POT CLAVULANATE 875-125 MG PO TABS: 1.0000 | ORAL_TABLET | Freq: Two times a day (BID) | ORAL | 0 refills | Status: AC

## 2018-11-03 MED ORDER — CETIRIZINE HCL 10 MG PO CAPS: 10.0000 mg | ORAL_CAPSULE | Freq: Every day | ORAL | 0 refills | Status: DC

## 2018-11-03 MED ORDER — FLUTICASONE PROPIONATE 50 MCG/ACT NA SUSP: 1.0000 | Freq: Every day | NASAL | 0 refills | Status: DC

## 2018-11-03 NOTE — Discharge Instructions (Signed)
Please begin taking Augmentin twice daily for the next 10 days to treat her ear infection For congestion I also recommend starting daily allergy pill like Zyrtec or Claritin, and using Flonase nasal spray 1 to 2 sprays in each nostril to help with congestion and draining fluid in ears  Tylenol and ibuprofen for pain and fevers  Please follow-up if symptoms not improving with the above treatment, worsening, developing swelling, neck stiffness or fevers

## 2018-11-03 NOTE — ED Triage Notes (Signed)
Pt states she has a ear infection blood coming from her left ear. Pt states she has right ear discomfort x 1 week.

## 2018-11-03 NOTE — ED Provider Notes (Signed)
[facility]    CSN: [id number] Arrival date & time: 11/03/18  4098     History   Chief Complaint Chief Complaint  Patient presents with  . Otalgia    HPI Kari [name] is a 22 y.o. female no contributing past medical history presenting today for evaluation of bilateral ear pain.  Patient states that a week ago she developed muffled sensation and decreased hearing in both ears.  Since she has developed increased pain and is noticed some drainage out of her left ear.  She also notes that she has had congestion and cough associated with this.  This is been going on off and on over the past 2 months.  She was recently on antibiotics approximately 2 weeks ago.  Denies fevers.  Denies chest pain or shortness of breath.  Taking Tylenol and ibuprofen for pain, as well as Alka-Seltzer cold and flu.  HPI  Past Medical History:  Diagnosis Date  . Anemia   . Anxiety   . Depression   . Headache    migrains  . IUFD (intrauterine fetal death) 2015/05/02    Patient Active Problem List   Diagnosis Date Noted  . Vitamin D deficiency 01/22/2018  . History of IUFD 10/07/2015    Past Surgical History:  Procedure Laterality Date  . APPENDECTOMY    . HERNIA REPAIR      OB History    Gravida  4   Para  3   Term  2   Preterm  1   AB  1   Living  2     SAB  1   TAB      Ectopic      Multiple  0   Live Births  2            Home Medications    Prior to Admission medications   Medication Sig Start Date End Date Taking? Authorizing Provider  acetaminophen (TYLENOL) 325 MG tablet Take 2 tablets (650 mg total) by mouth every 4 (four) hours as needed (for pain scale < 4). 07/15/18   Sandi Raveling, MD  amoxicillin-clavulanate (AUGMENTIN) 875-125 MG tablet Take 1 tablet by mouth every 12 (twelve) hours for 10 days. 11/03/18 11/13/18  ,  C, PA-C  Cetirizine HCl 10 MG CAPS Take 1 capsule (10 mg total) by mouth daily for 10 days. 11/03/18 11/13/18   ,  C, PA-C  fluticasone (FLONASE) 50 MCG/ACT nasal spray Place 1-2 sprays into both nostrils daily for 7 days. 11/03/18 11/10/18  , Junius Creamer, PA-C    Family History Family History  Problem Relation Age of Onset  . Alcohol abuse Neg Hx   . Arthritis Neg Hx   . Asthma Neg Hx   . Birth defects Neg Hx   . Cancer Neg Hx   . COPD Neg Hx   . Depression Neg Hx   . Diabetes Neg Hx   . Drug abuse Neg Hx   . Early death Neg Hx   . Hearing loss Neg Hx   . Heart disease Neg Hx   . Hyperlipidemia Neg Hx   . Hypertension Neg Hx   . Kidney disease Neg Hx   . Learning disabilities Neg Hx   . Mental illness Neg Hx   . Mental retardation Neg Hx   . Miscarriages / Stillbirths Neg Hx   . Stroke Neg Hx   . Vision loss Neg Hx   . Varicose Veins Neg Hx  Social History Social History   Tobacco Use  . Smoking status: Never Smoker  . Smokeless tobacco: Never Used  Substance Use Topics  . Alcohol use: No  . Drug use: No     Allergies   Patient has no known allergies.   Review of Systems Review of Systems  Constitutional: Negative for activity change, appetite change, chills, fatigue and fever.  HENT: Positive for congestion, ear discharge, ear pain and rhinorrhea. Negative for sinus pressure, sore throat and trouble swallowing.   Eyes: Negative for discharge and redness.  Respiratory: Positive for cough. Negative for chest tightness and shortness of breath.   Cardiovascular: Negative for chest pain.  Gastrointestinal: Negative for abdominal pain, diarrhea, nausea and vomiting.  Musculoskeletal: Negative for myalgias.  Skin: Negative for rash.  Neurological: Negative for dizziness, light-headedness and headaches.     Physical Exam Triage Vital Signs ED Triage Vitals  Enc Vitals Group     BP 11/03/18 0831 111/66     Pulse Rate 11/03/18 0831 (!) 112     Resp 11/03/18 0831 16     Temp 11/03/18 0831 98.4 F (36.9 C)     Temp Source 11/03/18 0831 Oral      SpO2 11/03/18 0831 99 %     Weight 11/03/18 0836 115 lb (52.2 kg)     Height --      Head Circumference --      Peak Flow --      Pain Score 11/03/18 0835 7     Pain Loc --      Pain Edu? --      Excl. in GC? --    No data found.  Updated Vital Signs BP 111/66 (BP Location: Right Arm)   Pulse (!) 112   Temp 98.4 F (36.9 C) (Oral)   Resp 16   Wt 115 lb (52.2 kg)   SpO2 99%   BMI 21.03 kg/m  Heart rate rechecked prior to discharge, 105 Visual Acuity Right Eye Distance:   Left Eye Distance:   Bilateral Distance:    Right Eye Near:   Left Eye Near:    Bilateral Near:     Physical Exam  Constitutional: She appears well-developed and well-nourished. No distress.  HENT:  Head: Normocephalic and atraumatic.  Bilateral external auricles and tragus nontender to palpation, left canal with dried blood near opening, bilateral TMs significant erythematous and bulging.  Nasal mucosa pink, clear rhinorrhea present  Oral mucosa pink and moist, no tonsillar enlargement or exudate. Posterior pharynx patent and nonerythematous, no uvula deviation or swelling. Normal phonation.   Eyes: Conjunctivae are normal.  Neck: Neck supple.  Cardiovascular: Regular rhythm.  No murmur heard. Tachycardic  Pulmonary/Chest: Effort normal and breath sounds normal. No respiratory distress.  Breathing comfortably at rest, CTABL, no wheezing, rales or other adventitious sounds auscultated  Abdominal: Soft. There is no tenderness.  Musculoskeletal: She exhibits no edema.  Neurological: She is alert.  Skin: Skin is warm and dry.  Psychiatric: She has a normal mood and affect.  Nursing note and vitals reviewed.    UC Treatments / Results  Labs (all labs ordered are listed, but only abnormal results are displayed) Labs Reviewed - No data to display  EKG None  Radiology No results found.  Procedures Procedures (including critical care time)  Medications Ordered in UC Medications - No  data to display  Initial Impression / Assessment and Plan / UC Course  I have reviewed the triage vital signs and the  nursing notes.  Pertinent labs & imaging results that were available during my care of the patient were reviewed by me and considered in my medical decision making (see chart for details).     Patient with bilateral otitis media, will treat with Augmentin twice daily x10 days.  Also recommended Zyrtec and Flonase to help with congestion and eustachian tube dysfunction.  Tylenol and ibuprofen for pain and fevers.Discussed strict return precautions. Patient verbalized understanding and is agreeable with plan.  Final Clinical Impressions(s) / UC Diagnoses   Final diagnoses:  Non-recurrent acute suppurative otitis media of both ears without spontaneous rupture of tympanic membranes     Discharge Instructions     Please begin taking Augmentin twice daily for the next 10 days to treat her ear infection For congestion I also recommend starting daily allergy pill like Zyrtec or Claritin, and using Flonase nasal spray 1 to 2 sprays in each nostril to help with congestion and draining fluid in ears  Tylenol and ibuprofen for pain and fevers  Please follow-up if symptoms not improving with the above treatment, worsening, developing swelling, neck stiffness or fevers   ED Prescriptions    Medication Sig Dispense Auth. Provider   amoxicillin-clavulanate (AUGMENTIN) 875-125 MG tablet Take 1 tablet by mouth every 12 (twelve) hours for 10 days. 20 tablet ,  C, PA-C   Cetirizine HCl 10 MG CAPS Take 1 capsule (10 mg total) by mouth daily for 10 days. 10 capsule ,  C, PA-C   fluticasone (FLONASE) 50 MCG/ACT nasal spray Place 1-2 sprays into both nostrils daily for 7 days. 1 g , [place] C, PA-C     Controlled Substance Prescriptions [place] Controlled Substance Registry consulted? Not Applicable   Lew Dawes, [place] 11/03/18 [phone]

## 2018-11-13 ENCOUNTER — Ambulatory Visit (INDEPENDENT_AMBULATORY_CARE_PROVIDER_SITE_OTHER): Payer: 59

## 2018-11-13 DIAGNOSIS — Z3042 Encounter for surveillance of injectable contraceptive: Secondary | ICD-10-CM

## 2018-11-13 MED ORDER — MEDROXYPROGESTERONE ACETATE 150 MG/ML IM SUSP
150.0000 mg | Freq: Once | INTRAMUSCULAR | Status: AC
  Administered 2018-11-13: 150 mg via INTRAMUSCULAR

## 2018-11-13 NOTE — Progress Notes (Signed)
Pt presents for depo shot. Pt is within her window. Inj given in R deltoid. Pt tolerated well. Next depo due 2/5-2/19

## 2018-12-24 NOTE — L&D Delivery Note (Signed)
Delivery Note At 8:52 AM a viable female was delivered via Vaginal, Spontaneous (Presentation:ROA Right Occiput Anterior).  APGAR: 9, 9; weight pending .   Placenta status: Spontaneous, Intact.  Cord: 3 vessels with the following complications: None.  Cord pH: NA  Anesthesia: Epidural Episiotomy: None Lacerations: None Suture Repair: NA Est. Blood Loss (mL): 250  Mom to postpartum.  Baby to Couplet care / Skin to Skin.  Kari [name] 12/04/2019, 9:23 AM

## 2019-02-04 ENCOUNTER — Ambulatory Visit: Payer: 59

## 2019-04-04 ENCOUNTER — Ambulatory Visit (HOSPITAL_COMMUNITY)
Admission: EM | Admit: 2019-04-04 | Discharge: 2019-04-04 | Disposition: A | Discharge status: Home / Self Care | Payer: 59 | Attending: Physician Assistant | Admitting: Physician Assistant

## 2019-04-04 ENCOUNTER — Other Ambulatory Visit: Payer: Self-pay

## 2019-04-04 ENCOUNTER — Encounter (HOSPITAL_COMMUNITY): Payer: Self-pay | Admitting: Physician Assistant

## 2019-04-04 DIAGNOSIS — Z3202 Encounter for pregnancy test, result negative: Secondary | ICD-10-CM

## 2019-04-04 DIAGNOSIS — N898 Other specified noninflammatory disorders of vagina: Secondary | ICD-10-CM | POA: Diagnosis not present

## 2019-04-04 LAB — POCT URINALYSIS DIP (DEVICE)
Bilirubin Urine: NEGATIVE
Glucose, UA: NEGATIVE mg/dL
Hgb urine dipstick: NEGATIVE
Ketones, ur: NEGATIVE mg/dL
Leukocytes,Ua: NEGATIVE
Nitrite: NEGATIVE
Protein, ur: NEGATIVE mg/dL
Specific Gravity, Urine: >=1.03 (ref 1.005–1.030)
Urobilinogen, UA: 0.2 mg/dL (ref 0.0–1.0)
pH: 5.5 (ref 5.0–8.0)

## 2019-04-04 LAB — POCT PREGNANCY, URINE: Preg Test, Ur: NEGATIVE

## 2019-04-04 MED ORDER — METRONIDAZOLE 0.75 % VA GEL: 1.0000 | Freq: Every day | VAGINAL | 0 refills | Status: AC

## 2019-04-04 NOTE — ED Triage Notes (Signed)
Per pt she has been having a discharge and a smell right after sex. Possible pregnancy. Some irritation and some back pain since for about 1 week

## 2019-04-04 NOTE — Discharge Instructions (Signed)
Urine negative for infection, pregnancy. Start metrogel for possible BV causing symptoms. Refrain from sexual activity  for the next 7 days. Monitor for any worsening of symptoms, fever, abdominal pain, nausea, vomiting, to follow up for reevaluation.

## 2019-04-04 NOTE — ED Provider Notes (Signed)
[facility]    CSN: [id number] Arrival date & time: 04/04/19  1050     History   Chief Complaint Chief Complaint  Patient presents with   Vaginal Discharge    HPI Kari [name] is a 23 y.o. female.   23 year old female comes in for 1 week history of vaginal discharge. States has had discharge with odor. Denies vaginal itching, spotting. Has urinary frequency, pressure with urination, no obvious dysuria, hematuria. Denies fever, chills, night sweats. Some back aching. Sexually active with one female partner, no condom use. Patient had depo injection 10/2018, had mild vaginal bleeding/spotting for a few days 12/2018. Did not get another depo injection 01/2019.      Past Medical History:  Diagnosis Date   Anemia    Anxiety    Depression    Headache    migrains   IUFD (intrauterine fetal death) 2016    Patient Active Problem List   Diagnosis Date Noted   Vitamin D deficiency 01/22/2018   History of IUFD 10/07/2015    Past Surgical History:  Procedure Laterality Date   APPENDECTOMY     HERNIA REPAIR      OB History    Gravida  4   Para  3   Term  2   Preterm  1   AB  1   Living  2     SAB  1   TAB      Ectopic      Multiple  0   Live Births  2            Home Medications    Prior to Admission medications   Medication Sig Start Date End Date Taking? Authorizing Provider  acetaminophen (TYLENOL) 325 MG tablet Take 2 tablets (650 mg total) by mouth every 4 (four) hours as needed (for pain scale < 4). 07/15/18   Sandi RavelingAlvis, Anson B, MD  Cetirizine HCl 10 MG CAPS Take 1 capsule (10 mg total) by mouth daily for 10 days. 11/03/18 11/13/18  Wieters, Hallie C, PA-C  fluticasone (FLONASE) 50 MCG/ACT nasal spray Place 1-2 sprays into both nostrils daily for 7 days. 11/03/18 11/10/18  Wieters, Hallie C, PA-C  metroNIDAZOLE (METROGEL VAGINAL) 0.75 % vaginal gel Place 1 Applicatorful vaginally at bedtime for 5 days. 04/04/19 04/09/19  Belinda FisherYu,  Kloie Whiting V, PA-C    Family History Family History  Problem Relation Age of Onset   Alcohol abuse Neg Hx    Arthritis Neg Hx    Asthma Neg Hx    Birth defects Neg Hx    Cancer Neg Hx    COPD Neg Hx    Depression Neg Hx    Diabetes Neg Hx    Drug abuse Neg Hx    Early death Neg Hx    Hearing loss Neg Hx    Heart disease Neg Hx    Hyperlipidemia Neg Hx    Hypertension Neg Hx    Kidney disease Neg Hx    Learning disabilities Neg Hx    Mental illness Neg Hx    Mental retardation Neg Hx    Miscarriages / Stillbirths Neg Hx    Stroke Neg Hx    Vision loss Neg Hx    Varicose Veins Neg Hx     Social History Social History   Tobacco Use   Smoking status: Never Smoker   Smokeless tobacco: Never Used  Substance Use Topics   Alcohol use: No   Drug use: No  Allergies   Patient has no known allergies.   Review of Systems Review of Systems  Reason unable to perform ROS: See HPI as above.     Physical Exam Triage Vital Signs ED Triage Vitals  Enc Vitals Group     BP 04/04/19 1112 101/67     Pulse Rate 04/04/19 1112 90     Resp 04/04/19 1112 16     Temp 04/04/19 1112 97.8 F (36.6 C)     Temp Source 04/04/19 1112 Oral     SpO2 04/04/19 1112 100 %     Weight --      Height --      Head Circumference --      Peak Flow --      Pain Score 04/04/19 1110 3     Pain Loc --      Pain Edu? --      Excl. in GC? --    No data found.  Updated Vital Signs BP 101/67 (BP Location: Right Arm)    Pulse 90    Temp 97.8 F (36.6 C) (Oral)    Resp 16    LMP 01/12/2019    SpO2 100%   Physical Exam Constitutional:      General: She is not in acute distress.    Appearance: She is well-developed. She is not ill-appearing, toxic-appearing or diaphoretic.  HENT:     Head: Normocephalic and atraumatic.  Eyes:     Conjunctiva/sclera: Conjunctivae normal.     Pupils: Pupils are equal, round, and reactive to light.  Cardiovascular:     Rate and Rhythm:  Normal rate and regular rhythm.     Heart sounds: Normal heart sounds. No murmur. No friction rub. No gallop.   Pulmonary:     Effort: Pulmonary effort is normal.     Breath sounds: Normal breath sounds. No wheezing or rales.  Abdominal:     General: Bowel sounds are normal.     Palpations: Abdomen is soft.     Tenderness: There is no abdominal tenderness. There is no right CVA tenderness, left CVA tenderness, guarding or rebound.  Skin:    General: Skin is warm and dry.  Neurological:     Mental Status: She is alert and oriented to person, place, and time.  Psychiatric:        Behavior: Behavior normal.        Judgment: Judgment normal.      UC Treatments / Results  Labs (all labs ordered are listed, but only abnormal results are displayed) Labs Reviewed  POCT URINALYSIS DIP (DEVICE)  POCT PREGNANCY, URINE    EKG None  Radiology No results found.  Procedures Procedures (including critical care time)  Medications Ordered in UC Medications - No data to display  Initial Impression / Assessment and Plan / UC Course  I have reviewed the triage vital signs and the nursing notes.  Pertinent labs & imaging results that were available during my care of the patient were reviewed by me and considered in my medical decision making (see chart for details).    Urine negative for pregnancy, infection. metrogel for BV. Patient declined cytology testing. Return precautions given. Patient expresses understanding and agrees to plan.  Final Clinical Impressions(s) / UC Diagnoses   Final diagnoses:  Vaginal discharge    ED Prescriptions    Medication Sig Dispense Auth. Provider   metroNIDAZOLE (METROGEL VAGINAL) 0.75 % vaginal gel Place 1 Applicatorful vaginally at bedtime for 5 days. 50 g  Dorena Cookey, Tajai Suder V, PA-C 04/04/19 1458

## 2019-04-06 LAB — POCT PREGNANCY, URINE: Preg Test, Ur: NEGATIVE

## 2019-04-27 ENCOUNTER — Other Ambulatory Visit: Payer: Self-pay

## 2019-04-27 ENCOUNTER — Ambulatory Visit (INDEPENDENT_AMBULATORY_CARE_PROVIDER_SITE_OTHER): Payer: 59 | Admitting: Obstetrics and Gynecology

## 2019-04-27 ENCOUNTER — Encounter: Payer: Self-pay | Admitting: Obstetrics and Gynecology

## 2019-04-27 DIAGNOSIS — O0990 Supervision of high risk pregnancy, unspecified, unspecified trimester: Secondary | ICD-10-CM | POA: Insufficient documentation

## 2019-04-27 DIAGNOSIS — O0991 Supervision of high risk pregnancy, unspecified, first trimester: Secondary | ICD-10-CM

## 2019-04-27 DIAGNOSIS — Z3A11 11 weeks gestation of pregnancy: Secondary | ICD-10-CM

## 2019-04-27 NOTE — Progress Notes (Signed)
Pt presents for NOB visit. Pt unsure exact day of LMP.  This is not planned pregnancy and [name] is not involved.  No complaints today per pt

## 2019-04-27 NOTE — Patient Instructions (Signed)
° °First Trimester of Pregnancy °The first trimester of pregnancy is from week 1 until the end of week 13 (months 1 through 3). A week after a sperm fertilizes an egg, the egg will implant on the wall of the uterus. This embryo will begin to develop into a baby. Genes from you and your partner will form the baby. The female genes will determine whether the baby will be a boy or a girl. At 6-8 weeks, the eyes and face will be formed, and the heartbeat can be seen on ultrasound. At the end of 12 weeks, all the baby's organs will be formed. °Now that you are pregnant, you will want to do everything you can to have a healthy baby. Two of the most important things are to get good prenatal care and to follow your health care provider's instructions. Prenatal care is all the medical care you receive before the baby's birth. This care will help prevent, find, and treat any problems during the pregnancy and childbirth. °Body changes during your first trimester °Your body goes through many changes during pregnancy. The changes vary from woman to woman. °· You may gain or lose a couple of pounds at first. °· You may feel sick to your stomach (nauseous) and you may throw up (vomit). If the vomiting is uncontrollable, call your health care provider. °· You may tire easily. °· You may develop headaches that can be relieved by medicines. All medicines should be approved by your health care provider. °· You may urinate more often. Painful urination may mean you have a bladder infection. °· You may develop heartburn as a result of your pregnancy. °· You may develop constipation because certain hormones are causing the muscles that push stool through your intestines to slow down. °· You may develop hemorrhoids or swollen veins (varicose veins). °· Your breasts may begin to grow larger and become tender. Your nipples may stick out more, and the tissue that surrounds them (areola) may become darker. °· Your gums may bleed and may be  sensitive to brushing and flossing. °· Dark spots or blotches (chloasma, mask of pregnancy) may develop on your face. This will likely fade after the baby is born. °· Your menstrual periods will stop. °· You may have a loss of appetite. °· You may develop cravings for certain kinds of food. °· You may have changes in your emotions from day to day, such as being excited to be pregnant or being concerned that something may go wrong with the pregnancy and baby. °· You may have more vivid and strange dreams. °· You may have changes in your hair. These can include thickening of your hair, rapid growth, and changes in texture. Some women also have hair loss during or after pregnancy, or hair that feels dry or thin. Your hair will most likely return to normal after your baby is born. °What to expect at prenatal visits °During a routine prenatal visit: °· You will be weighed to make sure you and the baby are growing normally. °· Your blood pressure will be taken. °· Your abdomen will be measured to track your baby's growth. °· The fetal heartbeat will be listened to between weeks 10 and 14 of your pregnancy. °· Test results from any previous visits will be discussed. °Your health care provider may ask you: °· How you are feeling. °· If you are feeling the baby move. °· If you have had any abnormal symptoms, such as leaking fluid, bleeding, severe headaches, or   abdominal cramping. °· If you are using any tobacco products, including cigarettes, chewing tobacco, and electronic cigarettes. °· If you have any questions. °Other tests that may be performed during your first trimester include: °· Blood tests to find your blood type and to check for the presence of any previous infections. The tests will also be used to check for low iron levels (anemia) and protein on red blood cells (Rh antibodies). Depending on your risk factors, or if you previously had diabetes during pregnancy, you may have tests to check for high blood sugar  that affects pregnant women (gestational diabetes). °· Urine tests to check for infections, diabetes, or protein in the urine. °· An ultrasound to confirm the proper growth and development of the baby. °· Fetal screens for spinal cord problems (spina bifida) and Down syndrome. °· HIV (human immunodeficiency virus) testing. Routine prenatal testing includes screening for HIV, unless you choose not to have this test. °· You may need other tests to make sure you and the baby are doing well. °Follow these instructions at home: °Medicines °· Follow your health care provider's instructions regarding medicine use. Specific medicines may be either safe or unsafe to take during pregnancy. °· Take a prenatal vitamin that contains at least 600 micrograms (mcg) of folic acid. °· If you develop constipation, try taking a stool softener if your health care provider approves. °Eating and drinking ° °· Eat a balanced diet that includes fresh fruits and vegetables, whole grains, good sources of protein such as meat, eggs, or tofu, and low-fat dairy. Your health care provider will help you determine the amount of weight gain that is right for you. °· Avoid raw meat and uncooked cheese. These carry germs that can cause birth defects in the baby. °· Eating four or five small meals rather than three large meals a day may help relieve nausea and vomiting. If you start to feel nauseous, eating a few soda crackers can be helpful. Drinking liquids between meals, instead of during meals, also seems to help ease nausea and vomiting. °· Limit foods that are high in fat and processed sugars, such as fried and sweet foods. °· To prevent constipation: °? Eat foods that are high in fiber, such as fresh fruits and vegetables, whole grains, and beans. °? Drink enough fluid to keep your urine clear or pale yellow. °Activity °· Exercise only as directed by your health care provider. Most women can continue their usual exercise routine during  pregnancy. Try to exercise for 30 minutes at least 5 days a week. Exercising will help you: °? Control your weight. °? Stay in shape. °? Be prepared for labor and delivery. °· Experiencing pain or cramping in the lower abdomen or lower back is a good sign that you should stop exercising. Check with your health care provider before continuing with normal exercises. °· Try to avoid standing for long periods of time. Move your legs often if you must stand in one place for a long time. °· Avoid heavy lifting. °· Wear low-heeled shoes and practice good posture. °· You may continue to have sex unless your health care provider tells you not to. °Relieving pain and discomfort °· Wear a good support bra to relieve breast tenderness. °· Take warm sitz baths to soothe any pain or discomfort caused by hemorrhoids. Use hemorrhoid cream if your health care provider approves. °· Rest with your legs elevated if you have leg cramps or low back pain. °· If you develop varicose veins   in your legs, wear support hose. Elevate your feet for 15 minutes, 3-4 times a day. Limit salt in your diet. °Prenatal care °· Schedule your prenatal visits by the twelfth week of pregnancy. They are usually scheduled monthly at first, then more often in the last 2 months before delivery. °· Write down your questions. Take them to your prenatal visits. °· Keep all your prenatal visits as told by your health care provider. This is important. °Safety °· Wear your seat belt at all times when driving. °· Make a list of emergency phone numbers, including numbers for family, friends, the hospital, and police and fire departments. °General instructions °· Ask your health care provider for a referral to a local prenatal education class. Begin classes no later than the beginning of month 6 of your pregnancy. °· Ask for help if you have counseling or nutritional needs during pregnancy. Your health care provider can offer advice or refer you to specialists for help  with various needs. °· Do not use hot tubs, steam rooms, or saunas. °· Do not douche or use tampons or scented sanitary pads. °· Do not cross your legs for long periods of time. °· Avoid cat litter boxes and soil used by cats. These carry germs that can cause birth defects in the baby and possibly loss of the fetus by miscarriage or stillbirth. °· Avoid all smoking, herbs, alcohol, and medicines not prescribed by your health care provider. Chemicals in these products affect the formation and growth of the baby. °· Do not use any products that contain nicotine or tobacco, such as cigarettes and e-cigarettes. If you need help quitting, ask your health care provider. You may receive counseling support and other resources to help you quit. °· Schedule a dentist appointment. At home, brush your teeth with a soft toothbrush and be gentle when you floss. °Contact a health care provider if: °· You have dizziness. °· You have mild pelvic cramps, pelvic pressure, or nagging pain in the abdominal area. °· You have persistent nausea, vomiting, or diarrhea. °· You have a bad smelling vaginal discharge. °· You have pain when you urinate. °· You notice increased swelling in your face, hands, legs, or ankles. °· You are exposed to fifth disease or chickenpox. °· You are exposed to German measles (rubella) and have never had it. °Get help right away if: °· You have a fever. °· You are leaking fluid from your vagina. °· You have spotting or bleeding from your vagina. °· You have severe abdominal cramping or pain. °· You have rapid weight gain or loss. °· You vomit blood or material that looks like coffee grounds. °· You develop a severe headache. °· You have shortness of breath. °· You have any kind of trauma, such as from a fall or a car accident. °Summary °· The first trimester of pregnancy is from week 1 until the end of week 13 (months 1 through 3). °· Your body goes through many changes during pregnancy. The changes vary from  woman to woman. °· You will have routine prenatal visits. During those visits, your health care provider will examine you, discuss any test results you may have, and talk with you about how you are feeling. °This information is not intended to replace advice given to you by your health care provider. Make sure you discuss any questions you have with your health care provider. °Document Released: 12/04/2001 Document Revised: 11/21/2016 Document Reviewed: 11/21/2016 °[employer] Interactive Patient Education © 2019 [employer] ° ° °  Second Trimester of Pregnancy °The second trimester is from week 14 through week 27 (months 4 through 6). The second trimester is often a time when you feel your best. Your body has adjusted to being pregnant, and you begin to feel better physically. Usually, morning sickness has lessened or quit completely, you may have more energy, and you may have an increase in appetite. The second trimester is also a time when the fetus is growing rapidly. At the end of the sixth month, the fetus is about 9 inches long and weighs about 1½ pounds. You will likely begin to feel the baby move (quickening) between 16 and 20 weeks of pregnancy. °Body changes during your second trimester °Your body continues to go through many changes during your second trimester. The changes vary from woman to woman. °· Your weight will continue to increase. You will notice your lower abdomen bulging out. °· You may begin to get stretch marks on your hips, abdomen, and breasts. °· You may develop headaches that can be relieved by medicines. The medicines should be approved by your health care provider. °· You may urinate more often because the fetus is pressing on your bladder. °· You may develop or continue to have heartburn as a result of your pregnancy. °· You may develop constipation because certain hormones are causing the muscles that push waste through your intestines to slow down. °· You may develop hemorrhoids or  swollen, bulging veins (varicose veins). °· You may have back pain. This is caused by: °? Weight gain. °? Pregnancy hormones that are relaxing the joints in your pelvis. °? A shift in weight and the muscles that support your balance. °· Your breasts will continue to grow and they will continue to become tender. °· Your gums may bleed and may be sensitive to brushing and flossing. °· Dark spots or blotches (chloasma, mask of pregnancy) may develop on your face. This will likely fade after the baby is born. °· A dark line from your belly button to the pubic area (linea nigra) may appear. This will likely fade after the baby is born. °· You may have changes in your hair. These can include thickening of your hair, rapid growth, and changes in texture. Some women also have hair loss during or after pregnancy, or hair that feels dry or thin. Your hair will most likely return to normal after your baby is born. °What to expect at prenatal visits °During a routine prenatal visit: °· You will be weighed to make sure you and the fetus are growing normally. °· Your blood pressure will be taken. °· Your abdomen will be measured to track your baby's growth. °· The fetal heartbeat will be listened to. °· Any test results from the previous visit will be discussed. °Your health care provider may ask you: °· How you are feeling. °· If you are feeling the baby move. °· If you have had any abnormal symptoms, such as leaking fluid, bleeding, severe headaches, or abdominal cramping. °· If you are using any tobacco products, including cigarettes, chewing tobacco, and electronic cigarettes. °· If you have any questions. °Other tests that may be performed during your second trimester include: °· Blood tests that check for: °? Low iron levels (anemia). °? High blood sugar that affects pregnant women (gestational diabetes) between 24 and 28 weeks. °? Rh antibodies. This is to check for a protein on red blood cells (Rh factor). °· Urine tests  to check for infections, diabetes, or protein in   the urine. °· An ultrasound to confirm the proper growth and development of the baby. °· An amniocentesis to check for possible genetic problems. °· Fetal screens for spina bifida and Down syndrome. °· HIV (human immunodeficiency virus) testing. Routine prenatal testing includes screening for HIV, unless you choose not to have this test. °Follow these instructions at home: °Medicines °· Follow your health care provider's instructions regarding medicine use. Specific medicines may be either safe or unsafe to take during pregnancy. °· Take a prenatal vitamin that contains at least 600 micrograms (mcg) of folic acid. °· If you develop constipation, try taking a stool softener if your health care provider approves. °Eating and drinking ° °· Eat a balanced diet that includes fresh fruits and vegetables, whole grains, good sources of protein such as meat, eggs, or tofu, and low-fat dairy. Your health care provider will help you determine the amount of weight gain that is right for you. °· Avoid raw meat and uncooked cheese. These carry germs that can cause birth defects in the baby. °· If you have low calcium intake from food, talk to your health care provider about whether you should take a daily calcium supplement. °· Limit foods that are high in fat and processed sugars, such as fried and sweet foods. °· To prevent constipation: °? Drink enough fluid to keep your urine clear or pale yellow. °? Eat foods that are high in fiber, such as fresh fruits and vegetables, whole grains, and beans. °Activity °· Exercise only as directed by your health care provider. Most women can continue their usual exercise routine during pregnancy. Try to exercise for 30 minutes at least 5 days a week. Stop exercising if you experience uterine contractions. °· Avoid heavy lifting, wear low heel shoes, and practice good posture. °· A sexual relationship may be continued unless your health care  provider directs you otherwise. °Relieving pain and discomfort °· Wear a good support bra to prevent discomfort from breast tenderness. °· Take warm sitz baths to soothe any pain or discomfort caused by hemorrhoids. Use hemorrhoid cream if your health care provider approves. °· Rest with your legs elevated if you have leg cramps or low back pain. °· If you develop varicose veins, wear support hose. Elevate your feet for 15 minutes, 3-4 times a day. Limit salt in your diet. °Prenatal Care °· Write down your questions. Take them to your prenatal visits. °· Keep all your prenatal visits as told by your health care provider. This is important. °Safety °· Wear your seat belt at all times when driving. °· Make a list of emergency phone numbers, including numbers for family, friends, the hospital, and police and fire departments. °General instructions °· Ask your health care provider for a referral to a local prenatal education class. Begin classes no later than the beginning of month 6 of your pregnancy. °· Ask for help if you have counseling or nutritional needs during pregnancy. Your health care provider can offer advice or refer you to specialists for help with various needs. °· Do not use hot tubs, steam rooms, or saunas. °· Do not douche or use tampons or scented sanitary pads. °· Do not cross your legs for long periods of time. °· Avoid cat litter boxes and soil used by cats. These carry germs that can cause birth defects in the baby and possibly loss of the fetus by miscarriage or stillbirth. °· Avoid all smoking, herbs, alcohol, and unprescribed drugs. Chemicals in these products can affect the formation   and growth of the baby. °· Do not use any products that contain nicotine or tobacco, such as cigarettes and e-cigarettes. If you need help quitting, ask your health care provider. °· Visit your dentist if you have not gone yet during your pregnancy. Use a soft toothbrush to brush your teeth and be gentle when you  floss. °Contact a health care provider if: °· You have dizziness. °· You have mild pelvic cramps, pelvic pressure, or nagging pain in the abdominal area. °· You have persistent nausea, vomiting, or diarrhea. °· You have a bad smelling vaginal discharge. °· You have pain when you urinate. °Get help right away if: °· You have a fever. °· You are leaking fluid from your vagina. °· You have spotting or bleeding from your vagina. °· You have severe abdominal cramping or pain. °· You have rapid weight gain or weight loss. °· You have shortness of breath with chest pain. °· You notice sudden or extreme swelling of your face, hands, ankles, feet, or legs. °· You have not felt your baby move in over an hour. °· You have severe headaches that do not go away when you take medicine. °· You have vision changes. °Summary °· The second trimester is from week 14 through week 27 (months 4 through 6). It is also a time when the fetus is growing rapidly. °· Your body goes through many changes during pregnancy. The changes vary from woman to woman. °· Avoid all smoking, herbs, alcohol, and unprescribed drugs. These chemicals affect the formation and growth your baby. °· Do not use any tobacco products, such as cigarettes, chewing tobacco, and e-cigarettes. If you need help quitting, ask your health care provider. °· Contact your health care provider if you have any questions. Keep all prenatal visits as told by your health care provider. This is important. °This information is not intended to replace advice given to you by your health care provider. Make sure you discuss any questions you have with your health care provider. °Document Released: 12/04/2001 Document Revised: 01/15/2017 Document Reviewed: 01/15/2017 °[employer] Interactive Patient Education © 2019 [employer] ° ° °Contraception Choices °Contraception, also called birth control, refers to methods or devices that prevent pregnancy. °Hormonal methods °Contraceptive  implant ° °A contraceptive implant is a thin, plastic tube that contains a hormone. It is inserted into the upper part of the arm. It can remain in place for up to 3 years. °Progestin-only injections °Progestin-only injections are injections of progestin, a synthetic form of the hormone progesterone. They are given every 3 months by a health care provider. °Birth control pills ° °Birth control pills are pills that contain hormones that prevent pregnancy. They must be taken once a day, preferably at the same time each day. °Birth control patch ° °The birth control patch contains hormones that prevent pregnancy. It is placed on the skin and must be changed once a week for three weeks and removed on the fourth week. A prescription is needed to use this method of contraception. °Vaginal ring ° °A vaginal ring contains hormones that prevent pregnancy. It is placed in the vagina for three weeks and removed on the fourth week. After that, the process is repeated with a new ring. A prescription is needed to use this method of contraception. °Emergency contraceptive °Emergency contraceptives prevent pregnancy after unprotected sex. They come in pill form and can be taken up to 5 days after sex. They work best the sooner they are taken after having sex. Most emergency   contraceptives are available without a prescription. This method should not be used as your only form of birth control. °Barrier methods °Female condom ° °A female condom is a thin sheath that is worn over the penis during sex. Condoms keep sperm from going inside a woman's body. They can be used with a spermicide to increase their effectiveness. They should be disposed after a single use. °Female condom ° °A female condom is a soft, loose-fitting sheath that is put into the vagina before sex. The condom keeps sperm from going inside a woman's body. They should be disposed after a single use. °Diaphragm ° °A diaphragm is a soft, dome-shaped barrier. It is inserted  into the vagina before sex, along with a spermicide. The diaphragm blocks sperm from entering the uterus, and the spermicide kills sperm. A diaphragm should be left in the vagina for 6-8 hours after sex and removed within 24 hours. °A diaphragm is prescribed and fitted by a health care provider. A diaphragm should be replaced every 1-2 years, after giving birth, after gaining more than 15 lb (6.8 kg), and after pelvic surgery. °Cervical cap ° °A cervical cap is a round, soft latex or plastic cup that fits over the cervix. It is inserted into the vagina before sex, along with spermicide. It blocks sperm from entering the uterus. The cap should be left in place for 6-8 hours after sex and removed within 48 hours. A cervical cap must be prescribed and fitted by a health care provider. It should be replaced every 2 years. °Sponge ° °A sponge is a soft, circular piece of polyurethane foam with spermicide on it. The sponge helps block sperm from entering the uterus, and the spermicide kills sperm. To use it, you make it wet and then insert it into the vagina. It should be inserted before sex, left in for at least 6 hours after sex, and removed and thrown away within 30 hours. °Spermicides °Spermicides are chemicals that kill or block sperm from entering the cervix and uterus. They can come as a cream, jelly, suppository, foam, or tablet. A spermicide should be inserted into the vagina with an applicator at least 10-15 minutes before sex to allow time for it to work. The process must be repeated every time you have sex. Spermicides do not require a prescription. °Intrauterine contraception °Intrauterine device (IUD) °An IUD is a T-shaped device that is put in a woman's uterus. There are two types: °· Hormone IUD.This type contains progestin, a synthetic form of the hormone progesterone. This type can stay in place for 3-5 years. °· Copper IUD.This type is wrapped in copper wire. It can stay in place for 10  years. ° °Permanent methods of contraception °Female tubal ligation °In this method, a woman's fallopian tubes are sealed, tied, or blocked during surgery to prevent eggs from traveling to the uterus. °Hysteroscopic sterilization °In this method, a small, flexible insert is placed into each fallopian tube. The inserts cause scar tissue to form in the fallopian tubes and block them, so sperm cannot reach an egg. The procedure takes about 3 months to be effective. Another form of birth control must be used during those 3 months. °Female sterilization °This is a procedure to tie off the tubes that carry sperm (vasectomy). After the procedure, the man can still ejaculate fluid (semen). °Natural planning methods °Natural family planning °In this method, a couple does not have sex on days when the woman could become pregnant. °Calendar method °This means keeping track   of the length of each menstrual cycle, identifying the days when pregnancy can happen, and not having sex on those days. °Ovulation method °In this method, a couple avoids sex during ovulation. °Symptothermal method °This method involves not having sex during ovulation. The woman typically checks for ovulation by watching changes in her temperature and in the consistency of cervical mucus. °Post-ovulation method °In this method, a couple waits to have sex until after ovulation. °Summary °· Contraception, also called birth control, means methods or devices that prevent pregnancy. °· Hormonal methods of contraception include implants, injections, pills, patches, vaginal rings, and emergency contraceptives. °· Barrier methods of contraception can include female condoms, female condoms, diaphragms, cervical caps, sponges, and spermicides. °· There are two types of IUDs (intrauterine devices). An IUD can be put in a woman's uterus to prevent pregnancy for 3-5 years. °· Permanent sterilization can be done through a procedure for males, females, or both. °· Natural  family planning methods involve not having sex on days when the woman could become pregnant. °This information is not intended to replace advice given to you by your health care provider. Make sure you discuss any questions you have with your health care provider. °Document Released: 12/10/2005 Document Revised: 12/12/2017 Document Reviewed: 01/12/2017 °Elsevier Interactive Patient Education © 2019 [employer]. ° ° °Breastfeeding ° °Choosing to breastfeed is one of the best decisions you can make for yourself and your baby. A change in hormones during pregnancy causes your breasts to make breast milk in your milk-producing glands. Hormones prevent breast milk from being released before your baby is born. They also prompt milk flow after birth. Once breastfeeding has begun, thoughts of your baby, as well as his or her sucking or crying, can stimulate the release of milk from your milk-producing glands. °Benefits of breastfeeding °Research shows that breastfeeding offers many health benefits for infants and mothers. It also offers a cost-free and convenient way to feed your baby. °For your baby °· Your first milk (colostrum) helps your baby's digestive system to function better. °· Special cells in your milk (antibodies) help your baby to fight off infections. °· Breastfed babies are less likely to develop asthma, allergies, obesity, or type 2 diabetes. They are also at lower risk for sudden infant death syndrome (SIDS). °· Nutrients in breast milk are better able to meet your baby’s needs compared to infant formula. °· Breast milk improves your baby's brain development. °For you °· Breastfeeding helps to create a very special bond between you and your baby. °· Breastfeeding is convenient. Breast milk costs nothing and is always available at the correct temperature. °· Breastfeeding helps to burn calories. It helps you to lose the weight that you gained during pregnancy. °· Breastfeeding makes your uterus return  faster to its size before pregnancy. It also slows bleeding (lochia) after you give birth. °· Breastfeeding helps to lower your risk of developing type 2 diabetes, osteoporosis, rheumatoid arthritis, cardiovascular disease, and breast, ovarian, uterine, and endometrial cancer later in life. °Breastfeeding basics °Starting breastfeeding °· Find a comfortable place to sit or lie down, with your neck and back well-supported. °· Place a pillow or a rolled-up blanket under your baby to bring him or her to the level of your breast (if you are seated). Nursing pillows are specially designed to help support your arms and your baby while you breastfeed. °· Make sure that your baby's tummy (abdomen) is facing your abdomen. °· Gently massage your breast. With your fingertips, massage from the   outer edges of your breast inward toward the nipple. This encourages milk flow. If your milk flows slowly, you may need to continue this action during the feeding. °· Support your breast with 4 fingers underneath and your thumb above your nipple (make the letter "C" with your hand). Make sure your fingers are well away from your nipple and your baby’s mouth. °· Stroke your baby's lips gently with your finger or nipple. °· When your baby's mouth is open wide enough, quickly bring your baby to your breast, placing your entire nipple and as much of the areola as possible into your baby's mouth. The areola is the colored area around your nipple. °? More areola should be visible above your baby's upper lip than below the lower lip. °? Your baby's lips should be opened and extended outward (flanged) to ensure an adequate, comfortable latch. °? Your baby's tongue should be between his or her lower gum and your breast. °· Make sure that your baby's mouth is correctly positioned around your nipple (latched). Your baby's lips should create a seal on your breast and be turned out (everted). °· It is common for your baby to suck about 2-3 minutes in  order to start the flow of breast milk. °Latching °Teaching your baby how to latch onto your breast properly is very important. An improper latch can cause nipple pain, decreased milk supply, and poor weight gain in your baby. Also, if your baby is not latched onto your nipple properly, he or she may swallow some air during feeding. This can make your baby fussy. Burping your baby when you switch breasts during the feeding can help to get rid of the air. However, teaching your baby to latch on properly is still the best way to prevent fussiness from swallowing air while breastfeeding. °Signs that your baby has successfully latched onto your nipple °· Silent tugging or silent sucking, without causing you pain. Infant's lips should be extended outward (flanged). °· Swallowing heard between every 3-4 sucks once your milk has started to flow (after your let-down milk reflex occurs). °· Muscle movement above and in front of his or her ears while sucking. °Signs that your baby has not successfully latched onto your nipple °· Sucking sounds or smacking sounds from your baby while breastfeeding. °· Nipple pain. °If you think your baby has not latched on correctly, slip your finger into the corner of your baby’s mouth to break the suction and place it between your baby's gums. Attempt to start breastfeeding again. °Signs of successful breastfeeding °Signs from your baby °· Your baby will gradually decrease the number of sucks or will completely stop sucking. °· Your baby will fall asleep. °· Your baby's body will relax. °· Your baby will retain a small amount of milk in his or her mouth. °· Your baby will let go of your breast by himself or herself. °Signs from you °· Breasts that have increased in firmness, weight, and size 1-3 hours after feeding. °· Breasts that are softer immediately after breastfeeding. °· Increased milk volume, as well as a change in milk consistency and color by the fifth day of  breastfeeding. °· Nipples that are not sore, cracked, or bleeding. °Signs that your baby is getting enough milk °· Wetting at least 1-2 diapers during the first 24 hours after birth. °· Wetting at least 5-6 diapers every 24 hours for the first week after birth. The urine should be clear or pale yellow by the age of 5 days. °·   Wetting 6-8 diapers every 24 hours as your baby continues to grow and develop. °· At least 3 stools in a 24-hour period by the age of 5 days. The stool should be soft and yellow. °· At least 3 stools in a 24-hour period by the age of 7 days. The stool should be seedy and yellow. °· No loss of weight greater than 10% of birth weight during the first 3 days of life. °· Average weight gain of 4-7 oz (113-198 g) per week after the age of 4 days. °· Consistent daily weight gain by the age of 5 days, without weight loss after the age of 2 weeks. °After a feeding, your baby may spit up a small amount of milk. This is normal. °Breastfeeding frequency and duration °Frequent feeding will help you make more milk and can prevent sore nipples and extremely full breasts (breast engorgement). Breastfeed when you feel the need to reduce the fullness of your breasts or when your baby shows signs of hunger. This is called "breastfeeding on demand." Signs that your baby is hungry include: °· Increased alertness, activity, or restlessness. °· Movement of the head from side to side. °· Opening of the mouth when the corner of the mouth or cheek is stroked (rooting). °· Increased sucking sounds, smacking lips, cooing, sighing, or squeaking. °· Hand-to-mouth movements and sucking on fingers or hands. °· Fussing or crying. °Avoid introducing a pacifier to your baby in the first 4-6 weeks after your baby is born. After this time, you may choose to use a pacifier. Research has shown that pacifier use during the first year of a baby's life decreases the risk of sudden infant death syndrome (SIDS). °Allow your baby to feed  on each breast as long as he or she wants. When your baby unlatches or falls asleep while feeding from the first breast, offer the second breast. Because newborns are often sleepy in the first few weeks of life, you may need to awaken your baby to get him or her to feed. °Breastfeeding times will vary from baby to baby. However, the following rules can serve as a guide to help you make sure that your baby is properly fed: °· Newborns (babies 4 weeks of age or younger) may breastfeed every 1-3 hours. °· Newborns should not go without breastfeeding for longer than 3 hours during the day or 5 hours during the night. °· You should breastfeed your baby a minimum of 8 times in a 24-hour period. °Breast milk pumping ° °  ° °Pumping and storing breast milk allows you to make sure that your baby is exclusively fed your breast milk, even at times when you are unable to breastfeed. This is especially important if you go back to work while you are still breastfeeding, or if you are not able to be present during feedings. Your lactation consultant can help you find a method of pumping that works best for you and give you guidelines about how long it is safe to store breast milk. °Caring for your breasts while you breastfeed °Nipples can become dry, cracked, and sore while breastfeeding. The following recommendations can help keep your breasts moisturized and healthy: °· Avoid using soap on your nipples. °· Wear a supportive bra designed especially for nursing. Avoid wearing underwire-style bras or extremely tight bras (sports bras). °· Air-dry your nipples for 3-4 minutes after each feeding. °· Use only cotton bra pads to absorb leaked breast milk. Leaking of breast milk between feedings is normal. °·   Use lanolin on your nipples after breastfeeding. Lanolin helps to maintain your skin's normal moisture barrier. Pure lanolin is not harmful (not toxic) to your baby. You may also hand express a few drops of breast milk and gently  massage that milk into your nipples and allow the milk to air-dry. °In the first few weeks after giving birth, some women experience breast engorgement. Engorgement can make your breasts feel heavy, warm, and tender to the touch. Engorgement peaks within 3-5 days after you give birth. The following recommendations can help to ease engorgement: °· Completely empty your breasts while breastfeeding or pumping. You may want to start by applying warm, moist heat (in the shower or with warm, water-soaked hand towels) just before feeding or pumping. This increases circulation and helps the milk flow. If your baby does not completely empty your breasts while breastfeeding, pump any extra milk after he or she is finished. °· Apply ice packs to your breasts immediately after breastfeeding or pumping, unless this is too uncomfortable for you. To do this: °? Put ice in a plastic bag. °? Place a towel between your skin and the bag. °? Leave the ice on for 20 minutes, 2-3 times a day. °· Make sure that your baby is latched on and positioned properly while breastfeeding. °If engorgement persists after 48 hours of following these recommendations, contact your health care provider or a lactation consultant. °Overall health care recommendations while breastfeeding °· Eat 3 healthy meals and 3 snacks every day. Well-nourished mothers who are breastfeeding need an additional 450-500 calories a day. You can meet this requirement by increasing the amount of a balanced diet that you eat. °· Drink enough water to keep your urine pale yellow or clear. °· Rest often, relax, and continue to take your prenatal vitamins to prevent fatigue, stress, and low vitamin and mineral levels in your body (nutrient deficiencies). °· Do not use any products that contain nicotine or tobacco, such as cigarettes and e-cigarettes. Your baby may be harmed by chemicals from cigarettes that pass into breast milk and exposure to secondhand smoke. If you need help  quitting, ask your health care provider. °· Avoid alcohol. °· Do not use illegal drugs or marijuana. °· Talk with your health care provider before taking any medicines. These include over-the-counter and prescription medicines as well as vitamins and herbal supplements. Some medicines that may be harmful to your baby can pass through breast milk. °· It is possible to become pregnant while breastfeeding. If birth control is desired, ask your health care provider about options that will be safe while breastfeeding your baby. °Where to find more information: °[employer]: www.llli.org °Contact a health care provider if: °· You feel like you want to stop breastfeeding or have become frustrated with breastfeeding. °· Your nipples are cracked or bleeding. °· Your breasts are red, tender, or warm. °· You have: °? Painful breasts or nipples. °? A swollen area on either breast. °? A fever or chills. °? Nausea or vomiting. °? Drainage other than breast milk from your nipples. °· Your breasts do not become full before feedings by the fifth day after you give birth. °· You feel sad and depressed. °· Your baby is: °? Too sleepy to eat well. °? Having trouble sleeping. °? More than 1 week old and wetting fewer than 6 diapers in a 24-hour period. °? Not gaining weight by 5 days of age. °· Your baby has fewer than 3 stools in a 24-hour period. °·   Your baby's skin or the white parts of his or her eyes become yellow. °Get help right away if: °· Your baby is overly tired (lethargic) and does not want to wake up and feed. °· Your baby develops an unexplained fever. °Summary °· Breastfeeding offers many health benefits for infant and mothers. °· Try to breastfeed your infant when he or she shows early signs of hunger. °· Gently tickle or stroke your baby's lips with your finger or nipple to allow the baby to open his or her mouth. Bring the baby to your breast. Make sure that much of the areola is in your baby's mouth.  Offer one side and burp the baby before you offer the other side. °· Talk with your health care provider or lactation consultant if you have questions or you face problems as you breastfeed. °This information is not intended to replace advice given to you by your health care provider. Make sure you discuss any questions you have with your health care provider. °Document Released: 12/10/2005 Document Revised: 01/11/2017 Document Reviewed: 01/11/2017 °Elsevier Interactive Patient Education © 2019 Elsevier Inc. ° ° °

## 2019-04-27 NOTE — Progress Notes (Signed)
Subjective:    Kari [name] is a [id number] [redacted]w[redacted]d being seen today for her first obstetrical visit.  Her obstetrical history is significant for 30 week unexplained stillbirth, uncomplicated subsequent pregnancies. Patient does intend to breast feed. Pregnancy history fully reviewed.  Patient reports no complaints.  Vitals:   04/27/19 1404  BP: 91/60  Pulse: 87  Weight: 107 lb 3.2 oz (48.6 kg)    HISTORY: OB History  Gravida Para Term Preterm AB Living  5 3 2 1 1 2   SAB TAB Ectopic Multiple Live Births  1     0 2    # Outcome Date GA Lbr Len/2nd Weight Sex Delivery Anes PTL Lv  5 Current           4 Term 07/13/18 [redacted]w[redacted]d 07:44 / 00:16 8 lb 3.8 oz (3.735 kg) M Vag-Spont EPI  LIV  3 Term 02/27/17 [redacted]w[redacted]d 06:44 / 00:12 7 lb 7 oz (3.374 kg) M Vag-Spont EPI  LIV  2 SAB 01/2016          1 Preterm 10/08/15 [redacted]w[redacted]d 03:20 / 00:02 3 lb 0.9 oz (1.385 kg) M Vag-Spont EPI  FD     [name] Comments: none   Past Medical History:  Diagnosis Date  . Anemia   . Anxiety   . Depression   . Headache    migrains  . IUFD (intrauterine fetal death) 04-25-15   Past Surgical History:  Procedure Laterality Date  . APPENDECTOMY    . HERNIA REPAIR     Family History  Problem Relation Age of Onset  . Alcohol abuse Neg Hx   . Arthritis Neg Hx   . Asthma Neg Hx   . [name] defects Neg Hx   . Cancer Neg Hx   . COPD Neg Hx   . Depression Neg Hx   . Diabetes Neg Hx   . Drug abuse Neg Hx   . Early death Neg Hx   . Hearing loss Neg Hx   . Heart disease Neg Hx   . Hyperlipidemia Neg Hx   . Hypertension Neg Hx   . Kidney disease Neg Hx   . Learning disabilities Neg Hx   . Mental illness Neg Hx   . Mental retardation Neg Hx   . Miscarriages / Stillbirths Neg Hx   . Stroke Neg Hx   . Vision loss Neg Hx   . Varicose Veins Neg Hx      Exam    Uterus:     Pelvic Exam:    Perineum: Normal Perineum   Vulva: normal   Vagina:  normal mucosa, normal discharge   pH:    Cervix: multiparous  appearance   Adnexa: normal adnexa and no mass, fullness, tenderness   Bony Pelvis: gynecoid  System: Breast:  normal appearance, no masses or tenderness   Skin: normal coloration and turgor, no rashes    Neurologic: oriented, no focal deficits   Extremities: normal strength, tone, and muscle mass   HEENT extra ocular movement intact   Mouth/Teeth mucous membranes moist, pharynx normal without lesions and dental hygiene good   Neck supple and no masses   Cardiovascular: regular rate and rhythm   Respiratory:  appears well, vitals normal, no respiratory distress, acyanotic, normal RR, chest clear, no wheezing, crepitations, rhonchi, normal symmetric air entry   Abdomen: soft, non-tender; bowel sounds normal; no masses,  no organomegaly   Urinary:    Pt informed that the ultrasound is considered a limited OB ultrasound and  is not intended to be a complete ultrasound exam.  Patient also informed that the ultrasound is not being completed with the intent of assessing for fetal or placental anomalies or any pelvic abnormalities.  Explained that the purpose of today's ultrasound is to assess for  viability.  Patient acknowledges the purpose of the exam and the limitations of the study.   Bedside ultrasound- intrauterine gestational sac with yolk sac but no fetal pole    Assessment:    Pregnancy: [id number] Patient Active Problem List   Diagnosis Date Noted  . Supervision of high risk pregnancy, antepartum 04/27/2019  . Vitamin D deficiency 01/22/2018  . History of IUFD 10/07/2015        Plan:     Initial labs drawn. Prenatal vitamins. Problem list reviewed and updated. Patient was previously on depo-provera and discontinued due to DUB. Patient is uncertain of LMP Patient informed of early gestation on ultrasound findings. Dating ultrasound ordered with ROB in 4 weeks. Prenatal labs at next visit pending viability Genetic Screening discussed: Panorama desired.  Ultrasound discussed;  fetal survey: requested.  Follow up in 4 weeks. 50% of 30 min visit spent on counseling and coordination of care.     Alvie Speltz 04/27/2019

## 2019-05-06 ENCOUNTER — Other Ambulatory Visit: Payer: Self-pay

## 2019-05-06 ENCOUNTER — Ambulatory Visit (HOSPITAL_COMMUNITY)
Admission: RE | Admit: 2019-05-06 | Discharge: 2019-05-06 | Disposition: A | Discharge status: Home / Self Care | Payer: 59 | Source: Ambulatory Visit | Attending: Obstetrics and Gynecology | Admitting: Obstetrics and Gynecology

## 2019-05-06 DIAGNOSIS — O0990 Supervision of high risk pregnancy, unspecified, unspecified trimester: Secondary | ICD-10-CM | POA: Insufficient documentation

## 2019-05-25 ENCOUNTER — Encounter: Payer: Self-pay | Admitting: Obstetrics and Gynecology

## 2019-05-25 ENCOUNTER — Other Ambulatory Visit: Payer: Self-pay

## 2019-05-25 ENCOUNTER — Ambulatory Visit (INDEPENDENT_AMBULATORY_CARE_PROVIDER_SITE_OTHER): Payer: 59 | Admitting: Obstetrics and Gynecology

## 2019-05-25 VITALS — BP 101/67 | HR 92 | Wt 105.0 lb

## 2019-05-25 DIAGNOSIS — Z8759 Personal history of other complications of pregnancy, childbirth and the puerperium: Secondary | ICD-10-CM

## 2019-05-25 DIAGNOSIS — Z113 Encounter for screening for infections with a predominantly sexual mode of transmission: Secondary | ICD-10-CM | POA: Diagnosis not present

## 2019-05-25 DIAGNOSIS — O0990 Supervision of high risk pregnancy, unspecified, unspecified trimester: Secondary | ICD-10-CM

## 2019-05-25 DIAGNOSIS — D573 Sickle-cell trait: Secondary | ICD-10-CM

## 2019-05-25 DIAGNOSIS — Z3A10 10 weeks gestation of pregnancy: Secondary | ICD-10-CM

## 2019-05-25 DIAGNOSIS — O9989 Other specified diseases and conditions complicating pregnancy, childbirth and the puerperium: Secondary | ICD-10-CM

## 2019-05-25 NOTE — Progress Notes (Signed)
   PRENATAL VISIT NOTE  Subjective:  Kari [name] is a 23 y.o. [phone] at [redacted]w[redacted]d being seen today for ongoing prenatal care.  She is currently monitored for the following issues for this high-risk pregnancy and has History of IUFD; Vitamin D deficiency; Supervision of high risk pregnancy, antepartum; and Sickle cell trait (HCC) on their problem list.  Patient reports she has had a small amount of dark red spotting, had one teaspoon worth and has just had spotting since. .  Contractions: Not present. Vag. Bleeding: Scant.  Movement: Absent. Denies leaking of fluid.   The following portions of the patient's history were reviewed and updated as appropriate: allergies, current medications, past family history, past medical history, past social history, past surgical history and problem list.   Objective:   Vitals:   05/25/19 0958  BP: 101/67  Pulse: 92  Weight: 105 lb (47.6 kg)   Fetal Status: Fetal Heart Rate (bpm): 160   Movement: Absent     General:  Alert, oriented and cooperative. Patient is in no acute distress.  Skin: Skin is warm and dry. No rash noted.   Cardiovascular: Normal heart rate noted  Respiratory: Normal respiratory effort, no problems with respiration noted  Abdomen: Soft, gravid, appropriate for gestational age.  Pain/Pressure: Absent     Pelvic: Cervical exam deferred        Extremities: Normal range of motion.     Mental Status: Normal mood and affect. Normal behavior. Normal judgment and thought content.   Assessment and Plan:  Pregnancy: [id number] at [redacted]w[redacted]d  1. Supervision of high risk pregnancy, antepartum - Culture, OB Urine - Obstetric Panel, Including HIV - Genetic Screening - Urine cytology ancillary only([place]) - SMN1 COPY NUMBER ANALYSIS (SMA Carrier Screen) - [place] MFM OB DETAIL +14 WK; Future  2. History of IUFD Testing starting 28 weeks  3. Sickle cell trait (HCC) Urine culture Q trim   Preterm labor symptoms and general obstetric  precautions including but not limited to vaginal bleeding, contractions, leaking of fluid and fetal movement were reviewed in detail with the patient. Please refer to After Visit Summary for other counseling recommendations.   Return in about 4 weeks (around 06/22/2019) for OB visit (MD), virtual.  No future appointments.  Conan Bowens, MD

## 2019-05-25 NOTE — Patient Instructions (Signed)
HOW TO TAKE YOUR BLOOD PRESSURE AT HOME ° °Take your blood pressure at home. Pick one day a week and take your blood pressure consistently every week on this day. Relax quietly for about 15 minutes then take your blood pressure. DO NOT take it when you are upset, stressed, or have just been active. Make sure your blood pressure cuff fits appropriately, there should be measurements on the box and on the cuff to help guide you.  ° °Once you have taken your blood pressure, look at the numbers. If the top number (systolic) is equal to or above 140, please call the office and let us know. If the bottom number (diastolic) is equal to or above 90, please call the office and let us know.  ° °If you have any questions or are concerned you are not taking your blood pressure correctly, please call the office! ° °If you have a headache that does not improve, problems with your vision, abdominal pain or other concerns, please call and let us know. Please go to the hospital with any severe symptoms. Please go to the hospital for labor, painful contractions every 5 minutes or less, leaking of fluid, or if you have not felt normal fetal movement.  ° °

## 2019-05-26 LAB — URINE CYTOLOGY ANCILLARY ONLY
Chlamydia: NEGATIVE
Neisseria Gonorrhea: NEGATIVE

## 2019-05-27 ENCOUNTER — Other Ambulatory Visit: Payer: 59

## 2019-05-27 ENCOUNTER — Other Ambulatory Visit: Payer: Self-pay

## 2019-05-27 LAB — OBSTETRIC PANEL, INCLUDING HIV
Antibody Screen: NEGATIVE
Basophils Absolute: 0 10*3/uL (ref 0.0–0.2)
Basos: 0 %
EOS (ABSOLUTE): 0.1 10*3/uL (ref 0.0–0.4)
Eos: 3 %
HIV Screen 4th Generation wRfx: NONREACTIVE
Hematocrit: 35.1 % (ref 34.0–46.6)
Hemoglobin: 11.7 g/dL (ref 11.1–15.9)
Hepatitis B Surface Ag: NEGATIVE
Immature Grans (Abs): 0 10*3/uL (ref 0.0–0.1)
Immature Granulocytes: 1 %
Lymphocytes Absolute: 1.1 10*3/uL (ref 0.7–3.1)
Lymphs: 27 %
MCH: 25.8 pg — ABNORMAL LOW (ref 26.6–33.0)
MCHC: 33.3 g/dL (ref 31.5–35.7)
MCV: 77 fL — ABNORMAL LOW (ref 79–97)
Monocytes Absolute: 0.3 10*3/uL (ref 0.1–0.9)
Monocytes: 7 %
Neutrophils Absolute: 2.6 10*3/uL (ref 1.4–7.0)
Neutrophils: 62 %
Platelets: 194 10*3/uL (ref 150–450)
RBC: 4.54 x10E6/uL (ref 3.77–5.28)
RDW: 13.5 % (ref 11.7–15.4)
RPR Ser Ql: NONREACTIVE
Rh Factor: POSITIVE
Rubella Antibodies, IGG: 1.8 index (ref >0.99)
WBC: 4.2 10*3/uL (ref 3.4–10.8)

## 2019-05-28 LAB — CULTURE, OB URINE

## 2019-05-28 LAB — URINE CULTURE, OB REFLEX

## 2019-06-01 ENCOUNTER — Telehealth: Payer: Self-pay | Admitting: *Deleted

## 2019-06-01 NOTE — Telephone Encounter (Signed)
Pt called to office stating that she is still having brown d/c.  Pt states she has been having this d/c x 2 weeks, discussed at last appt.  Pt states that she feels she needs an u/s in order to make sure everything is ok.  Pt made aware that [facility] was noted on last u/s and is likely product of this. Pt made aware [facility] usually resolve.  Pt states she has normally had u/s at 2nd trimester with previous pregnancies and she has not been given one at this time. Pt made aware that if no noted need for f/u u/s after provider visit, she would normally have u/s around 18-20 weeks.  Pt made aware message to be sent to provider for further recommendations and will receive return call.  Please advise on brown d/c and request for u/s. (please route to clinical pool).

## 2019-06-04 ENCOUNTER — Encounter: Payer: Self-pay | Admitting: Obstetrics and Gynecology

## 2019-06-08 ENCOUNTER — Encounter: Payer: Self-pay | Admitting: Obstetrics and Gynecology

## 2019-06-08 DIAGNOSIS — D563 Thalassemia minor: Secondary | ICD-10-CM | POA: Insufficient documentation

## 2019-06-10 LAB — SMN1 COPY NUMBER ANALYSIS (SMA CARRIER SCREENING)

## 2019-06-11 ENCOUNTER — Encounter: Payer: Self-pay | Admitting: Obstetrics and Gynecology

## 2019-06-22 ENCOUNTER — Ambulatory Visit (INDEPENDENT_AMBULATORY_CARE_PROVIDER_SITE_OTHER): Payer: 59 | Admitting: Obstetrics

## 2019-06-22 ENCOUNTER — Other Ambulatory Visit: Payer: Self-pay

## 2019-06-22 ENCOUNTER — Encounter: Payer: Self-pay | Admitting: Obstetrics

## 2019-06-22 DIAGNOSIS — Z8759 Personal history of other complications of pregnancy, childbirth and the puerperium: Secondary | ICD-10-CM

## 2019-06-22 DIAGNOSIS — Z3A14 14 weeks gestation of pregnancy: Secondary | ICD-10-CM

## 2019-06-22 DIAGNOSIS — O0990 Supervision of high risk pregnancy, unspecified, unspecified trimester: Secondary | ICD-10-CM

## 2019-06-22 DIAGNOSIS — D573 Sickle-cell trait: Secondary | ICD-10-CM

## 2019-06-22 DIAGNOSIS — O0992 Supervision of high risk pregnancy, unspecified, second trimester: Secondary | ICD-10-CM

## 2019-06-22 NOTE — Progress Notes (Signed)
   TELEHEALTH OBSTETRICS PRENATAL VIRTUAL VIDEO VISIT ENCOUNTER NOTE  Provider location: Center for [employer] [facility] at [place]   I connected with [name] on 06/22/19 at 10:30 AM EDT by WebEx OB MyChart Video Encounter at home and verified that I am speaking with the correct person using two identifiers.   I discussed the limitations, risks, security and privacy concerns of performing an evaluation and management service by telephone and the availability of in person appointments. I also discussed with the patient that there may be a patient responsible charge related to this service. The patient expressed understanding and agreed to proceed. Subjective:  Kari [name] is a 23 y.o. [phone] at [redacted]w[redacted]d being seen today for ongoing prenatal care.  She is currently monitored for the following issues for this high-risk pregnancy and has History of IUFD; Vitamin D deficiency; Supervision of high risk pregnancy, antepartum; Sickle cell trait ([place]); and Alpha thalassemia silent carrier on their problem list.  Patient reports no complaints.   .  .   . Denies any leaking of fluid.   The following portions of the patient's history were reviewed and updated as appropriate: allergies, current medications, past family history, past medical history, past social history, past surgical history and problem list.   Objective:  There were no vitals filed for this visit.  Fetal Status:           General:  Alert, oriented and cooperative. Patient is in no acute distress.  Respiratory: Normal respiratory effort, no problems with respiration noted  Mental Status: Normal mood and affect. Normal behavior. Normal judgment and thought content.  Rest of physical exam deferred due to type of encounter  Imaging: No results found.  Assessment and Plan:  Pregnancy: [id number] at [redacted]w[redacted]d 1. Supervision of high risk pregnancy, antepartum Rx: - AFP, Serum, Open Spina Bifida; Future  2. History of IUFD   3. Sickle cell trait ([place])   Preterm labor symptoms and general obstetric precautions including but not limited to vaginal bleeding, contractions, leaking of fluid and fetal movement were reviewed in detail with the patient. I discussed the assessment and treatment plan with the patient. The patient was provided an opportunity to ask questions and all were answered. The patient agreed with the plan and demonstrated an understanding of the instructions. The patient was advised to call back or seek an in-person office evaluation/go to MAU at [facility] for any urgent or concerning symptoms. Please refer to After Visit Summary for other counseling recommendations.   I provided 10 minutes of face-to-face time during this encounter.  Return in about 4 weeks (around 07/20/2019) for WebEx.  Future Appointments  Date Time Provider [place]  07/27/2019 10:30 AM [place] MFC-US  07/27/2019 10:30 AM [facility]-MFC [place] 1 WH-MFCUS MFC-US    Naomii Kreger, [place] for [facility], [facility] 06-22-2019

## 2019-06-22 NOTE — Progress Notes (Signed)
Pt is on the phone preparing for virtual visit with provider. [redacted]w[redacted]d.  

## 2019-07-06 ENCOUNTER — Other Ambulatory Visit: Payer: 59

## 2019-07-09 ENCOUNTER — Encounter (HOSPITAL_COMMUNITY): Payer: Self-pay | Admitting: *Deleted

## 2019-07-09 ENCOUNTER — Inpatient Hospital Stay (HOSPITAL_COMMUNITY)
Admission: AD | Admit: 2019-07-09 | Discharge: 2019-07-09 | Disposition: A | Discharge status: Home / Self Care | Payer: 59 | Attending: Family Medicine | Admitting: Family Medicine

## 2019-07-09 ENCOUNTER — Other Ambulatory Visit: Payer: Self-pay

## 2019-07-09 DIAGNOSIS — R102 Pelvic and perineal pain: Secondary | ICD-10-CM | POA: Diagnosis not present

## 2019-07-09 DIAGNOSIS — O26892 Other specified pregnancy related conditions, second trimester: Secondary | ICD-10-CM | POA: Diagnosis not present

## 2019-07-09 DIAGNOSIS — Z87891 Personal history of nicotine dependence: Secondary | ICD-10-CM | POA: Diagnosis not present

## 2019-07-09 DIAGNOSIS — O26899 Other specified pregnancy related conditions, unspecified trimester: Secondary | ICD-10-CM

## 2019-07-09 DIAGNOSIS — Z3A17 17 weeks gestation of pregnancy: Secondary | ICD-10-CM | POA: Insufficient documentation

## 2019-07-09 LAB — URINALYSIS, ROUTINE W REFLEX MICROSCOPIC
Bilirubin Urine: NEGATIVE
Glucose, UA: NEGATIVE mg/dL
Hgb urine dipstick: NEGATIVE
Ketones, ur: NEGATIVE mg/dL
Leukocytes,Ua: NEGATIVE
Nitrite: NEGATIVE
Protein, ur: NEGATIVE mg/dL
Specific Gravity, Urine: 1.021 (ref 1.005–1.030)
pH: 5 (ref 5.0–8.0)

## 2019-07-09 LAB — CBC
HCT: 30.2 % — ABNORMAL LOW (ref 36.0–46.0)
Hemoglobin: 10.4 g/dL — ABNORMAL LOW (ref 12.0–15.0)
MCH: 26.9 pg (ref 26.0–34.0)
MCHC: 34.4 g/dL (ref 30.0–36.0)
MCV: 78 fL — ABNORMAL LOW (ref 80.0–100.0)
Platelets: 178 10*3/uL (ref 150–400)
RBC: 3.87 MIL/uL (ref 3.87–5.11)
RDW: 13.7 % (ref 11.5–15.5)
WBC: 5.7 10*3/uL (ref 4.0–10.5)
nRBC: 0 % (ref 0.0–0.2)

## 2019-07-09 MED ORDER — IBUPROFEN 800 MG PO TABS
400.0000 mg | ORAL_TABLET | Freq: Once | ORAL | Status: AC
  Administered 2019-07-09: 400 mg via ORAL
  Filled 2019-07-09: qty 1

## 2019-07-09 NOTE — Progress Notes (Signed)
Hansel Feinstein CNM in earlier and did bedside u/s as pt voiced concerns about previous dx of subchorionic hemorrhage. CNM discussed test results with pt. Written and verbal d/c instructions given and understanding voiced

## 2019-07-09 NOTE — MAU Provider Note (Signed)
Chief Complaint:  Pelvic Pain   First Provider Initiated Contact with Patient 07/09/19 2019      HPI: Kari [name] is a 23 y.o. [id number] at 817w1dwho presents to maternity admissions reporting pelvic pain which radiates into top of thighs    Has been happening for about 2 days.  Does not feel like typical round ligament pain.   . She denies LOF, vaginal bleeding, vaginal itching/burning, urinary symptoms, h/a, dizziness, n/v, diarrhea, constipation or fever/chills.   RN Note: Having a lot of pain in pelvis when walking and turning in bed at night. Pelvic pressure and lower back pain as well for couple days. Tylenol has helped alittle  Past Medical History: Past Medical History:  Diagnosis Date  . Anemia   . Anxiety   . Depression   . Headache    migrains  . IUFD (intrauterine fetal death) 2016    Past obstetric history: OB History  Gravida Para Term Preterm AB Living  5 3 2 1 1 2   SAB TAB Ectopic Multiple Live Births  1     0 2    # Outcome Date GA Lbr Len/2nd Weight Sex Delivery Anes PTL Lv  5 Current           4 Term 07/13/18 406w5d 07:44 / 00:16 3735 g Genella MechM Vag-Spont EPI  LIV  3 Term 02/27/17 3771w3d 06:44 / 00:12 3374 g M Vag-Spont EPI  LIV  2 SAB 01/2016          1 Preterm 10/08/15 7836w0d 03:20 / 00:02 1385 g M Vag-Spont EPI  FD     Birth Comments: none    Past Surgical History: Past Surgical History:  Procedure Laterality Date  . APPENDECTOMY    . HERNIA REPAIR      Family History: Family History  Problem Relation Age of Onset  . Alcohol abuse Neg Hx   . Arthritis Neg Hx   . Asthma Neg Hx   . Birth defects Neg Hx   . Cancer Neg Hx   . COPD Neg Hx   . Depression Neg Hx   . Diabetes Neg Hx   . Drug abuse Neg Hx   . Early death Neg Hx   . Hearing loss Neg Hx   . Heart disease Neg Hx   . Hyperlipidemia Neg Hx   . Hypertension Neg Hx   . Kidney disease Neg Hx   . Learning disabilities Neg Hx   . Mental illness Neg Hx   . Mental retardation Neg Hx   .  Miscarriages / Stillbirths Neg Hx   . Stroke Neg Hx   . Vision loss Neg Hx   . Varicose Veins Neg Hx     Social History: Social History   Tobacco Use  . Smoking status: Former Games developermoker  . Smokeless tobacco: Never Used  . Tobacco comment: prior to +UPT  Substance Use Topics  . Alcohol use: No  . Drug use: No    Allergies: No Known Allergies  Meds:  Medications Prior to Admission  Medication Sig Dispense Refill Last Dose  . acetaminophen (TYLENOL) 500 MG tablet Take 1,000 mg by mouth every 6 (six) hours as needed.   Past Month at Unknown time  . Prenatal Vit w/Fe-Methylfol-FA (PNV PO) Take by mouth.   07/09/2019 at Unknown time  . Cetirizine HCl 10 MG CAPS Take 1 capsule (10 mg total) by mouth daily for 10 days. 10 capsule 0   . fluticasone (FLONASE) 50 MCG/ACT nasal  spray Place 1-2 sprays into both nostrils daily for 7 days. 1 g 0     I have reviewed patient's Past Medical Hx, Surgical Hx, Family Hx, Social Hx, medications and allergies.   ROS:  Review of Systems  Constitutional: Negative for chills and fever.  Respiratory: Negative for shortness of breath.   Gastrointestinal: Positive for abdominal pain. Negative for constipation, diarrhea, nausea and vomiting.  Genitourinary: Negative for difficulty urinating, dysuria, flank pain and vaginal bleeding.   Other systems negative  Physical Exam   Patient Vitals for the past 24 hrs:  BP Temp Pulse Resp Height Weight  07/09/19 1923 (!) 97/53 98.7 F (37.1 C) 97 16 5\' 2"  (1.575 m) 51.3 kg   Constitutional: Well-developed, well-nourished female in no acute distress.  Cardiovascular: normal rate and rhythm Respiratory: normal effort, clear to auscultation bilaterally GI: Abd soft, non-tender, gravid appropriate for gestational age.   No rebound or guarding. MS: Extremities nontender, no edema, normal ROM Neurologic: Alert and oriented x 4.  GU: Neg CVAT.  FHT:  161   Labs: Results for orders placed or performed during  the hospital encounter of 07/09/19 (from the past 24 hour(s))  Urinalysis, Routine w reflex microscopic     Status: Abnormal   Collection Time: 07/09/19  7:33 PM  Result Value Ref Range   Color, Urine YELLOW YELLOW   APPearance CLEAR CLEAR   Specific Gravity, Urine 1.021 1.005 - 1.030   pH 5.0 5.0 - 8.0   Glucose, UA NEGATIVE NEGATIVE mg/dL   Hgb urine dipstick NEGATIVE NEGATIVE   Bilirubin Urine NEGATIVE NEGATIVE   Ketones, ur NEGATIVE NEGATIVE mg/dL   Protein, ur NEGATIVE NEGATIVE mg/dL   Nitrite NEGATIVE NEGATIVE   Leukocytes,Ua NEGATIVE NEGATIVE   RBC / HPF 0-5 0 - 5 RBC/hpf   WBC, UA 0-5 0 - 5 WBC/hpf   Bacteria, UA RARE (A) NONE SEEN   Squamous Epithelial / LPF 0-5 0 - 5   Mucus PRESENT    Hyaline Casts, UA PRESENT   CBC     Status: Abnormal   Collection Time: 07/09/19  8:49 PM  Result Value Ref Range   WBC 5.7 4.0 - 10.5 K/uL   RBC 3.87 3.87 - 5.11 MIL/uL   Hemoglobin 10.4 (L) 12.0 - 15.0 g/dL   HCT 30.2 (L) 36.0 - 46.0 %   MCV 78.0 (L) 80.0 - 100.0 fL   MCH 26.9 26.0 - 34.0 pg   MCHC 34.4 30.0 - 36.0 g/dL   RDW 13.7 11.5 - 15.5 %   Platelets 178 150 - 400 K/uL   nRBC 0.0 0.0 - 0.2 %   B/Positive/-- (06/01 1109)  Imaging:  No results found.  MAU Course/MDM: I have ordered labs and reviewed results. No leukocytosis noted.  Urine is negative DIscussed this is likely round ligament pain or uterine cramping, exp since it was relieved by ibuprofen Informal bedside US done for patient reassurance.  Fetus was active, amniotic fluid appears normal .  Treatments in MAU included ibuprofen.    Assessment: Single intrauterine pregnancy at [redacted]w[redacted]d Pelvic pain and cramping, relieved by ibuprofen  Plan: Discharge home Preterm Labor precautions and fetal kick counts Follow up in Office for prenatal visits and recheck of pain  Encouraged to return here or to other Urgent Care/ED if she develops worsening of symptoms, increase in pain, fever, or other concerning symptoms.    Pt stable at time of discharge.  Hansel Feinstein CNM, MSN Certified Nurse-Midwife 07/09/2019 9:22 PM

## 2019-07-09 NOTE — MAU Note (Signed)
Having a lot of pain in pelvis when walking and turning in bed at night. Pelvic pressure and lower back pain as well for couple days. Tylenol has helped alittle

## 2019-07-09 NOTE — Discharge Instructions (Signed)

## 2019-07-15 ENCOUNTER — Telehealth: Payer: Self-pay | Admitting: Obstetrics

## 2019-07-20 ENCOUNTER — Ambulatory Visit (INDEPENDENT_AMBULATORY_CARE_PROVIDER_SITE_OTHER): Payer: 59 | Admitting: Obstetrics & Gynecology

## 2019-07-20 ENCOUNTER — Encounter: Payer: Self-pay | Admitting: Obstetrics & Gynecology

## 2019-07-20 VITALS — BP 111/75 | HR 98

## 2019-07-20 DIAGNOSIS — O0990 Supervision of high risk pregnancy, unspecified, unspecified trimester: Secondary | ICD-10-CM

## 2019-07-20 DIAGNOSIS — O99012 Anemia complicating pregnancy, second trimester: Secondary | ICD-10-CM

## 2019-07-20 DIAGNOSIS — Z3A18 18 weeks gestation of pregnancy: Secondary | ICD-10-CM

## 2019-07-20 DIAGNOSIS — D573 Sickle-cell trait: Secondary | ICD-10-CM

## 2019-07-20 DIAGNOSIS — D563 Thalassemia minor: Secondary | ICD-10-CM

## 2019-07-20 DIAGNOSIS — O0992 Supervision of high risk pregnancy, unspecified, second trimester: Secondary | ICD-10-CM

## 2019-07-20 DIAGNOSIS — Z8759 Personal history of other complications of pregnancy, childbirth and the puerperium: Secondary | ICD-10-CM

## 2019-07-20 NOTE — Progress Notes (Signed)
I connected with Kari [name] on 07/20/19 at 10:15 AM EDT by a video enabled telemedicine application and verified that I am speaking with the correct person using two identifiers.  No concerns today per pt.

## 2019-07-20 NOTE — Progress Notes (Signed)
   TELEHEALTH OBSTETRICS PRENATAL VIRTUAL VIDEO VISIT ENCOUNTER NOTE  Provider location: Center for [employer] [facility] at [place]   I connected with [name] on 07/20/19 at 10:15 AM EDT by MyChart Video Encounter at home and verified that I am speaking with the correct person using two identifiers.   I discussed the limitations, risks, security and privacy concerns of performing an evaluation and management service virtually and the availability of in person appointments. I also discussed with the patient that there may be a patient responsible charge related to this service. The patient expressed understanding and agreed to proceed. Subjective:  Kari [name] is a 23 y.o. [phone] at [redacted]w[redacted]d being seen today for ongoing prenatal care.  She is currently monitored for the following issues for this low-risk pregnancy and has History of IUFD; Vitamin D deficiency; Supervision of high risk pregnancy, antepartum; Sickle cell trait ([place]); and Alpha thalassemia silent carrier on their problem list.  Patient reports no complaints.  Contractions: Not present. Vag. Bleeding: None.  Movement: Present. Denies any leaking of fluid.   The following portions of the patient's history were reviewed and updated as appropriate: allergies, current medications, past family history, past medical history, past social history, past surgical history and problem list.   Objective:   Vitals:   07/20/19 1018  BP: 111/75  Pulse: 98    Fetal Status:     Movement: Present     General:  Alert, oriented and cooperative. Patient is in no acute distress.  Respiratory: Normal respiratory effort, no problems with respiration noted  Mental Status: Normal mood and affect. Normal behavior. Normal judgment and thought content.  Rest of physical exam deferred due to type of encounter  Imaging: No results found.  Assessment and Plan:  Pregnancy: [id number] at [redacted]w[redacted]d 1. Supervision of high risk pregnancy, antepartum  AFP ordered but, not done.  Pt needs to come in for AFP.    2. Sickle cell trait ([name]) Partner negative  3. History of IUFD Will begin antenatal testing at 28 weeks  4. Alpha thalassemia silent carrier   Preterm labor symptoms and general obstetric precautions including but not limited to vaginal bleeding, contractions, leaking of fluid and fetal movement were reviewed in detail with the patient. I discussed the assessment and treatment plan with the patient. The patient was provided an opportunity to ask questions and all were answered. The patient agreed with the plan and demonstrated an understanding of the instructions. The patient was advised to call back or seek an in-person office evaluation/go to MAU at [facility] for any urgent or concerning symptoms. Please refer to After Visit Summary for other counseling recommendations.   I provided 12 minutes of face-to-face time during this encounter.  No follow-ups on file.  Future Appointments  Date Time Provider [place]  07/27/2019 10:30 AM [place] MFC-US  07/27/2019 10:30 AM [facility]-MFC [place] 1 WH-MFCUS MFC-US    Lavonia Drafts, MD Center for [facility], [place]

## 2019-07-22 ENCOUNTER — Emergency Department (HOSPITAL_COMMUNITY): Payer: 59

## 2019-07-22 ENCOUNTER — Encounter (HOSPITAL_COMMUNITY): Payer: Self-pay | Admitting: *Deleted

## 2019-07-22 ENCOUNTER — Other Ambulatory Visit: Payer: Self-pay

## 2019-07-22 ENCOUNTER — Emergency Department (HOSPITAL_COMMUNITY)
Admission: EM | Admit: 2019-07-22 | Discharge: 2019-07-22 | Disposition: A | Discharge status: Home / Self Care | Payer: 59 | Attending: Emergency Medicine | Admitting: Emergency Medicine

## 2019-07-22 DIAGNOSIS — Z3A19 19 weeks gestation of pregnancy: Secondary | ICD-10-CM | POA: Insufficient documentation

## 2019-07-22 DIAGNOSIS — O9A212 Injury, poisoning and certain other consequences of external causes complicating pregnancy, second trimester: Secondary | ICD-10-CM | POA: Diagnosis present

## 2019-07-22 DIAGNOSIS — S7012XA Contusion of left thigh, initial encounter: Secondary | ICD-10-CM | POA: Insufficient documentation

## 2019-07-22 DIAGNOSIS — Y999 Unspecified external cause status: Secondary | ICD-10-CM | POA: Diagnosis not present

## 2019-07-22 DIAGNOSIS — O26892 Other specified pregnancy related conditions, second trimester: Secondary | ICD-10-CM

## 2019-07-22 DIAGNOSIS — Z87891 Personal history of nicotine dependence: Secondary | ICD-10-CM | POA: Diagnosis not present

## 2019-07-22 DIAGNOSIS — Y9289 Other specified places as the place of occurrence of the external cause: Secondary | ICD-10-CM | POA: Diagnosis not present

## 2019-07-22 DIAGNOSIS — Y9389 Activity, other specified: Secondary | ICD-10-CM | POA: Diagnosis not present

## 2019-07-22 DIAGNOSIS — S8012XA Contusion of left lower leg, initial encounter: Secondary | ICD-10-CM

## 2019-07-22 DIAGNOSIS — R109 Unspecified abdominal pain: Secondary | ICD-10-CM | POA: Insufficient documentation

## 2019-07-22 MED ORDER — OXYCODONE-ACETAMINOPHEN 5-325 MG PO TABS
1.0000 | ORAL_TABLET | Freq: Once | ORAL | Status: AC
  Administered 2019-07-22: 1 via ORAL
  Filled 2019-07-22: qty 1

## 2019-07-22 NOTE — ED Notes (Signed)
Pt wanted to go outside to speak with her mother.
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
## 2019-07-23 ENCOUNTER — Other Ambulatory Visit: Payer: Self-pay | Admitting: *Deleted

## 2019-07-23 DIAGNOSIS — Z8759 Personal history of other complications of pregnancy, childbirth and the puerperium: Secondary | ICD-10-CM

## 2019-07-23 DIAGNOSIS — O0990 Supervision of high risk pregnancy, unspecified, unspecified trimester: Secondary | ICD-10-CM

## 2019-07-27 ENCOUNTER — Ambulatory Visit (HOSPITAL_COMMUNITY)
Admission: RE | Admit: 2019-07-27 | Discharge: 2019-07-27 | Disposition: A | Discharge status: Home / Self Care | Payer: 59 | Source: Ambulatory Visit | Attending: Obstetrics and Gynecology | Admitting: Obstetrics and Gynecology

## 2019-07-27 ENCOUNTER — Other Ambulatory Visit (HOSPITAL_COMMUNITY): Payer: Self-pay | Admitting: *Deleted

## 2019-07-27 ENCOUNTER — Ambulatory Visit (HOSPITAL_COMMUNITY): Payer: 59 | Admitting: *Deleted

## 2019-07-27 ENCOUNTER — Other Ambulatory Visit: Payer: 59

## 2019-07-27 ENCOUNTER — Encounter (HOSPITAL_COMMUNITY): Payer: Self-pay | Admitting: *Deleted

## 2019-07-27 ENCOUNTER — Other Ambulatory Visit: Payer: Self-pay

## 2019-07-27 VITALS — BP 103/62 | HR 115 | Temp 99.0°F

## 2019-07-27 DIAGNOSIS — Z148 Genetic carrier of other disease: Secondary | ICD-10-CM

## 2019-07-27 DIAGNOSIS — O09292 Supervision of pregnancy with other poor reproductive or obstetric history, second trimester: Secondary | ICD-10-CM

## 2019-07-27 DIAGNOSIS — Z363 Encounter for antenatal screening for malformations: Secondary | ICD-10-CM | POA: Diagnosis not present

## 2019-07-27 DIAGNOSIS — Z3A19 19 weeks gestation of pregnancy: Secondary | ICD-10-CM

## 2019-07-27 DIAGNOSIS — Z862 Personal history of diseases of the blood and blood-forming organs and certain disorders involving the immune mechanism: Secondary | ICD-10-CM

## 2019-07-27 DIAGNOSIS — Z3689 Encounter for other specified antenatal screening: Secondary | ICD-10-CM | POA: Insufficient documentation

## 2019-07-27 DIAGNOSIS — Z362 Encounter for other antenatal screening follow-up: Secondary | ICD-10-CM

## 2019-07-27 DIAGNOSIS — O0990 Supervision of high risk pregnancy, unspecified, unspecified trimester: Secondary | ICD-10-CM | POA: Insufficient documentation
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
## 2019-09-01 ENCOUNTER — Other Ambulatory Visit: Payer: Self-pay

## 2019-09-01 ENCOUNTER — Ambulatory Visit (INDEPENDENT_AMBULATORY_CARE_PROVIDER_SITE_OTHER): Payer: 59 | Admitting: Obstetrics and Gynecology

## 2019-09-01 ENCOUNTER — Encounter: Payer: Self-pay | Admitting: Obstetrics and Gynecology

## 2019-09-01 ENCOUNTER — Ambulatory Visit (HOSPITAL_COMMUNITY): Payer: 59 | Admitting: *Deleted

## 2019-09-01 ENCOUNTER — Encounter (HOSPITAL_COMMUNITY): Payer: Self-pay

## 2019-09-01 ENCOUNTER — Ambulatory Visit (HOSPITAL_COMMUNITY)
Admission: RE | Admit: 2019-09-01 | Discharge: 2019-09-01 | Disposition: A | Discharge status: Home / Self Care | Payer: 59 | Source: Ambulatory Visit | Attending: Obstetrics and Gynecology | Admitting: Obstetrics and Gynecology

## 2019-09-01 ENCOUNTER — Other Ambulatory Visit (HOSPITAL_COMMUNITY): Payer: Self-pay | Admitting: *Deleted

## 2019-09-01 VITALS — BP 105/62 | HR 101 | Temp 98.5°F | Wt 123.6 lb

## 2019-09-01 VITALS — BP 105/63 | HR 99 | Temp 98.6°F

## 2019-09-01 DIAGNOSIS — Z8759 Personal history of other complications of pregnancy, childbirth and the puerperium: Secondary | ICD-10-CM

## 2019-09-01 DIAGNOSIS — Z3A24 24 weeks gestation of pregnancy: Secondary | ICD-10-CM

## 2019-09-01 DIAGNOSIS — O09292 Supervision of pregnancy with other poor reproductive or obstetric history, second trimester: Secondary | ICD-10-CM

## 2019-09-01 DIAGNOSIS — D573 Sickle-cell trait: Secondary | ICD-10-CM

## 2019-09-01 DIAGNOSIS — Z148 Genetic carrier of other disease: Secondary | ICD-10-CM

## 2019-09-01 DIAGNOSIS — O0990 Supervision of high risk pregnancy, unspecified, unspecified trimester: Secondary | ICD-10-CM

## 2019-09-01 DIAGNOSIS — Z862 Personal history of diseases of the blood and blood-forming organs and certain disorders involving the immune mechanism: Secondary | ICD-10-CM | POA: Diagnosis not present

## 2019-09-01 DIAGNOSIS — O0992 Supervision of high risk pregnancy, unspecified, second trimester: Secondary | ICD-10-CM

## 2019-09-01 DIAGNOSIS — Z362 Encounter for other antenatal screening follow-up: Secondary | ICD-10-CM

## 2019-09-01 NOTE — Progress Notes (Signed)
   PRENATAL VISIT NOTE  Subjective:  Kari [name] is a 23 y.o. [phone] at [redacted]w[redacted]d being seen today for ongoing prenatal care.  She is currently monitored for the following issues for this high-risk pregnancy and has History of IUFD; Vitamin D deficiency; Supervision of high risk pregnancy, antepartum; Sickle cell trait ([place]); and Alpha thalassemia silent carrier on their problem list.  Patient reports no complaints.  Contractions: Irritability. Vag. Bleeding: None.  Movement: Present. Denies leaking of fluid.   The following portions of the patient's history were reviewed and updated as appropriate: allergies, current medications, past family history, past medical history, past social history, past surgical history and problem list.   Objective:   Vitals:   09/01/19 1034  BP: 105/62  Pulse: (!) 101  Temp: 98.5 F (36.9 C)  Weight: 123 lb 9.6 oz (56.1 kg)    Fetal Status: Fetal Heart Rate (bpm): 156 Fundal Height: 25 cm Movement: Present     General:  Alert, oriented and cooperative. Patient is in no acute distress.  Skin: Skin is warm and dry. No rash noted.   Cardiovascular: Normal heart rate noted  Respiratory: Normal respiratory effort, no problems with respiration noted  Abdomen: Soft, gravid, appropriate for gestational age.  Pain/Pressure: Present     Pelvic: Cervical exam deferred        Extremities: Normal range of motion.  Edema: None  Mental Status: Normal mood and affect. Normal behavior. Normal judgment and thought content.   Assessment and Plan:  Pregnancy: [id number] at [redacted]w[redacted]d 1. Supervision of high risk pregnancy, antepartum Patient is doing well without complaints Third trimester labs next visit  2. Sickle cell trait ([place]) [name] negative  3. History of IUFD Will start antenatal testing at 28 weeks Follow up growth ultrasound today  Preterm labor symptoms and general obstetric precautions including but not limited to vaginal bleeding, contractions, leaking  of fluid and fetal movement were reviewed in detail with the patient. Please refer to After Visit Summary for other counseling recommendations.   Return in about 4 weeks (around 09/29/2019) for in person, ROB, High risk, 2 hr glucola next visit.  Future Appointments  Date Time Provider [place]  09/01/2019 11:00 AM [place] [facility]  09/01/2019 11:00 AM [place] 3 [facility]    Mora Bellman, MD

## 2019-09-09 IMAGING — US US MFM OB FOLLOW-UP
1 series · 14 of 28 positions shown · non-contrast
Comparison: none

[Series 1: us mfm ob follow-up · 32 acquisitions, 14 frames shown]
[im 2/32]
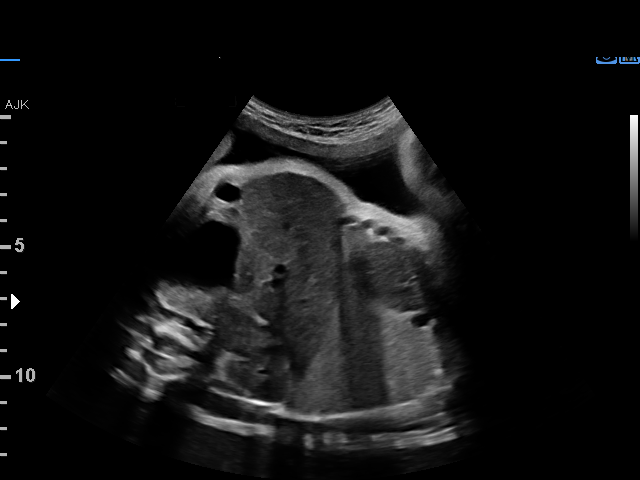
[im 4/32]
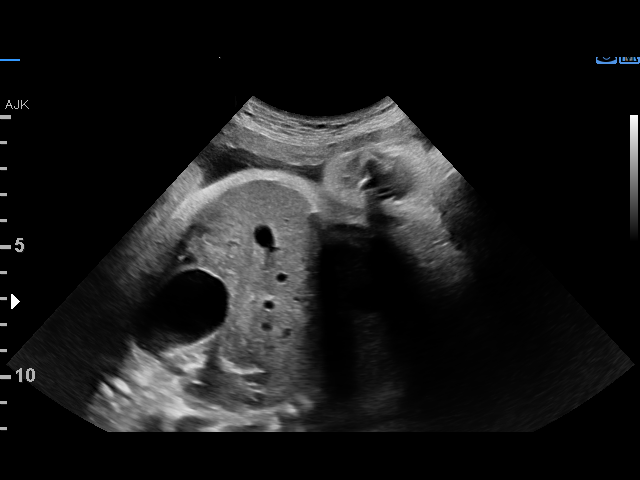
[im 6/32]
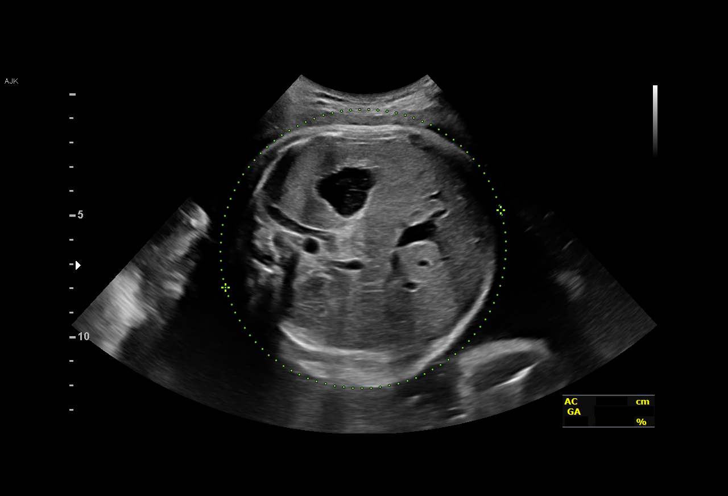
[im 9/32]
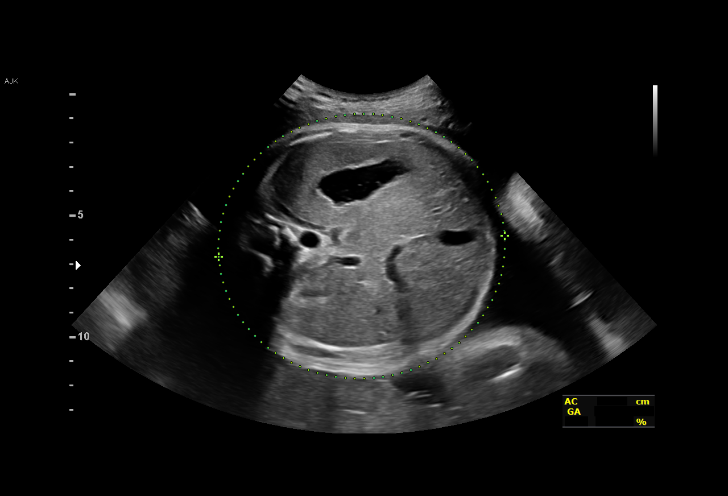
[im 11/32]
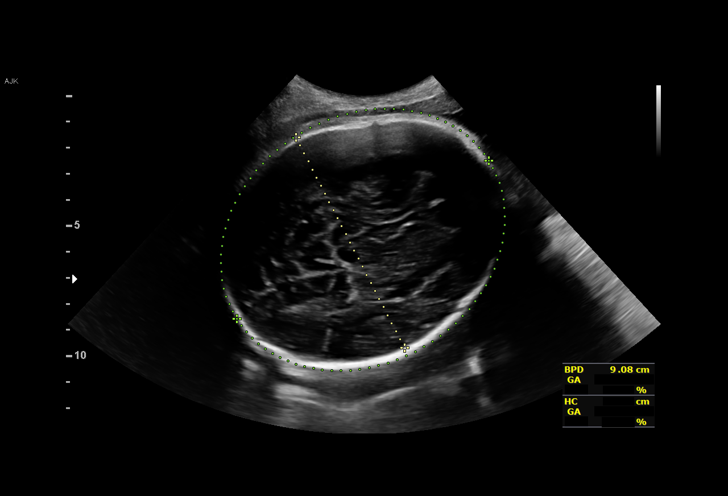
[im 13/32]
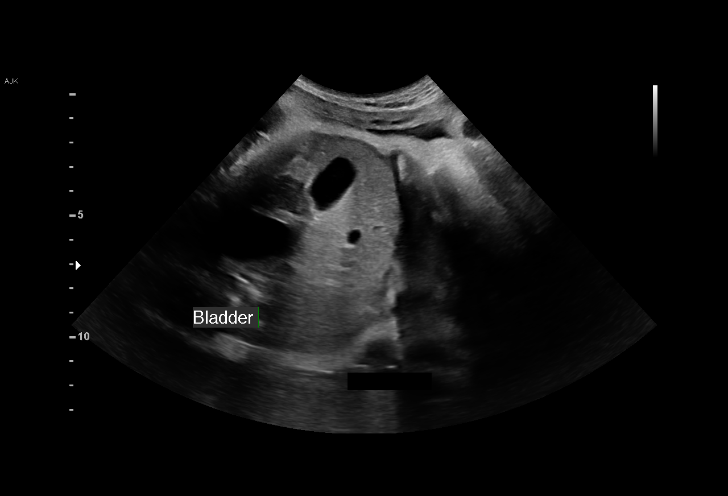
[im 15/32]
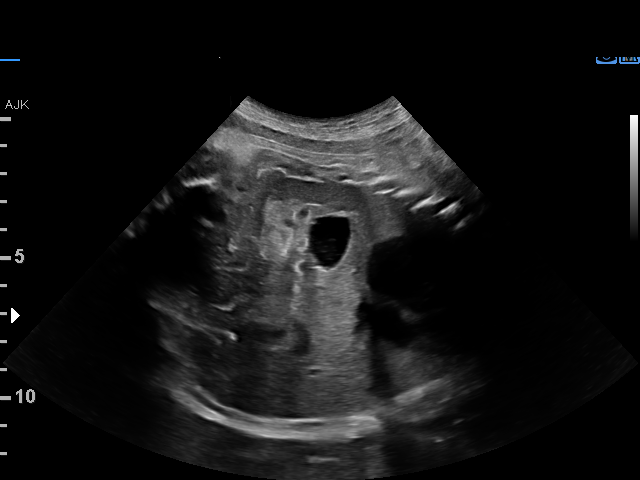
[im 18/32]
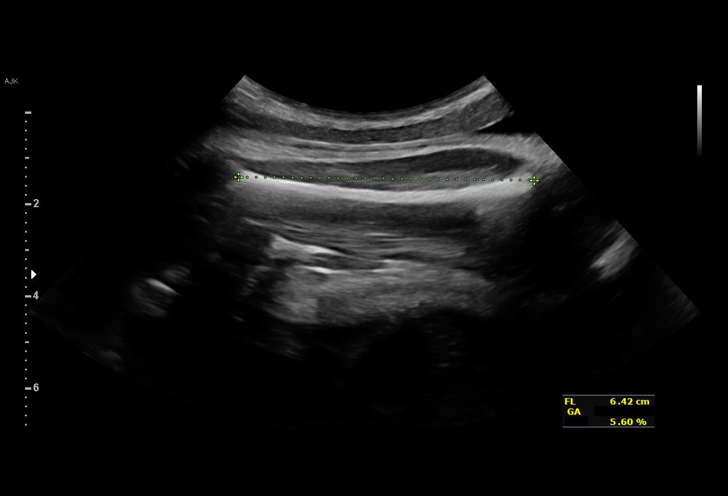
[im 20/32]
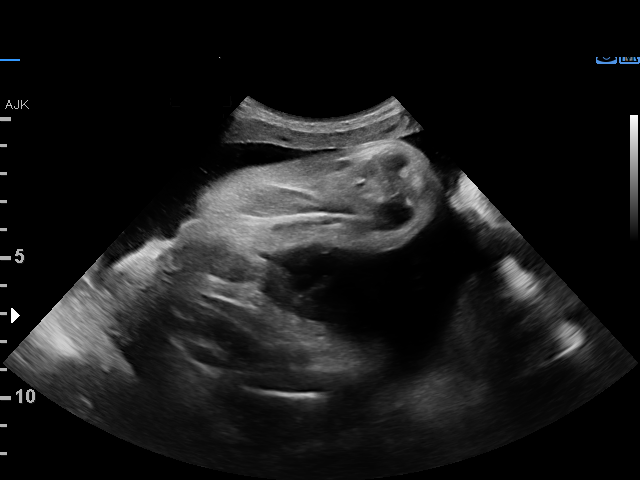
[im 22/32]
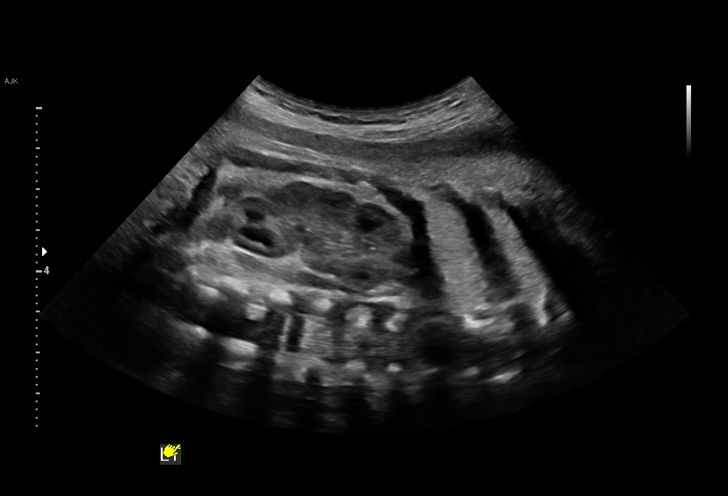
[im 25/32]
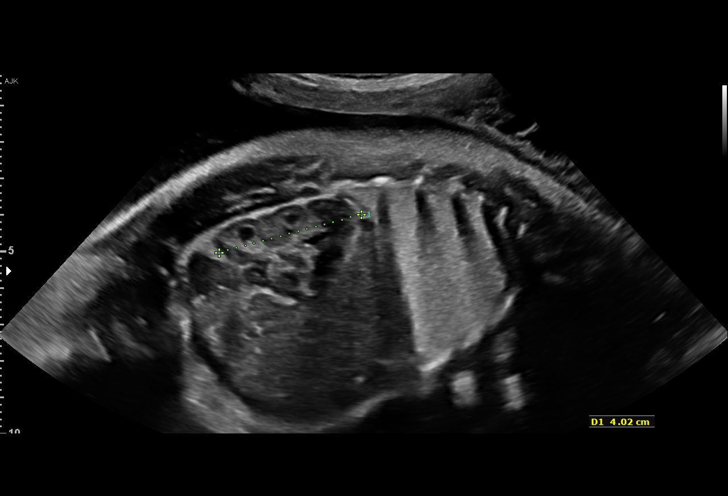
[im 27/32]
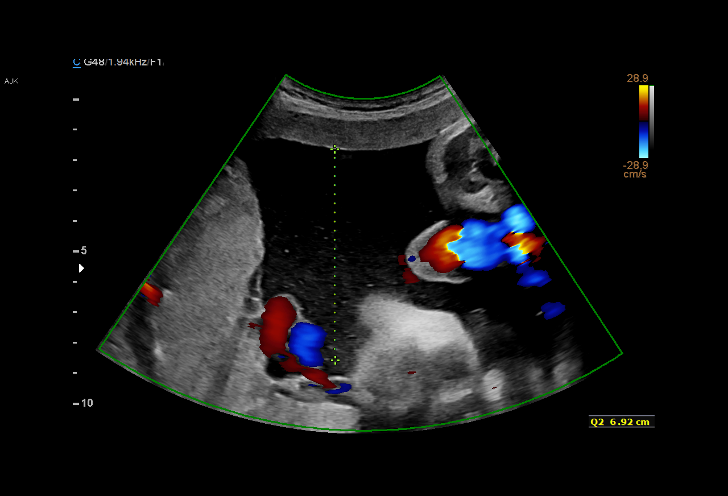
[im 29/32]
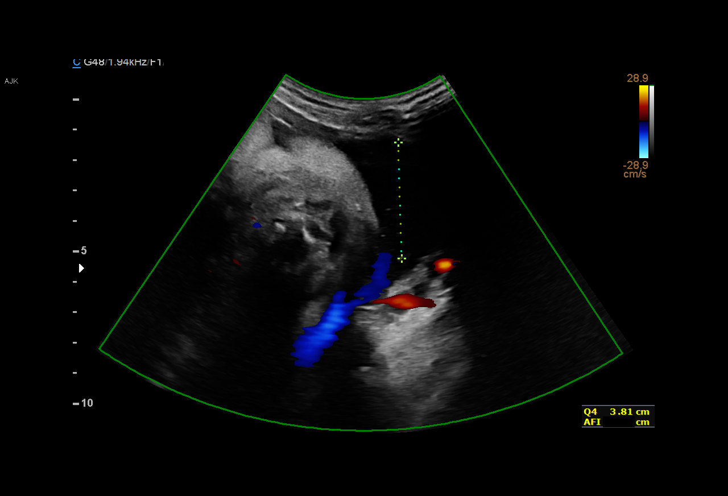
[im 32/32]
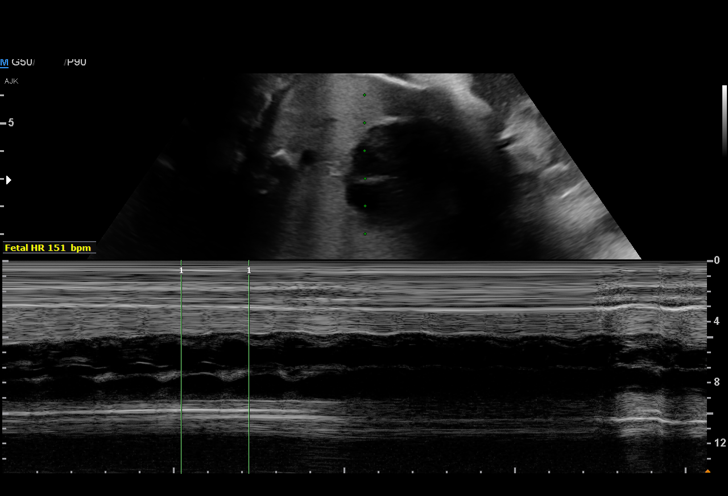

[14 of 28 positions shown; findings below may reference images not displayed]

Road [HOSPITAL]

1  AMNON TIGER            [id number]      [id number]     [id number]
2  AMNON TIGER            [id number]      [id number]     [id number]
Indications

35 weeks gestation of pregnancy
Poor obstetric history: Previous IUFD
(stillbirth) at 30 weeks
Encounter for other antenatal screening
follow-up
OB History

Blood Type:            Height:  5'2"   Weight (lb):  123       BMI:
Gravidity:    4         Term:   1        Prem:   1        SAB:   1
Living:       1
Fetal Evaluation

Num Of Fetuses:     1
Fetal Heart         151
Rate(bpm):
Cardiac Activity:   Observed
Presentation:       Cephalic
Placenta:           Posterior, above cervical os

Amniotic Fluid
AFI FV:      Subjectively within normal limits

AFI Sum(cm)     %Tile       Largest Pocket(cm)
18.72           70

RUQ(cm)       RLQ(cm)       LUQ(cm)        LLQ(cm)
5.04
Biophysical Evaluation

Amniotic F.V:   Within normal limits       F. Tone:        Observed
F. Movement:    Observed                   Score:          [DATE]
F. Breathing:   Observed
Biometry

BPD:      90.8  mm     G. Age:  36w 6d         91  %    CI:        76.58   %    70 - 86
FL/HC:      19.6   %    20.1 -
HC:      328.7  mm     G. Age:  37w 3d         72  %    HC/AC:      0.91        0.93 -
AC:      360.2  mm     G. Age:  39w 6d       > 97  %    FL/BPD:     70.9   %    71 - 87
FL:       64.4  mm     G. Age:  33w 2d          7  %    FL/AC:      17.9   %    20 - 24

Est. FW:    5519  gm      7 lb 5 oz   > 90  %
Gestational Age

LMP:           35w 1d        Date:  10/16/17                 EDD:   07/23/18
U/S Today:     36w 6d                                        EDD:   07/11/18
Best:          35w 1d     Det. By:  LMP  (10/16/17)          EDD:   07/23/18
Anatomy

Cranium:               Appears normal         Aortic Arch:            Previously seen
Cavum:                 Previously seen        Ductal Arch:            Previously seen
Ventricles:            Previously seen        Diaphragm:              Appears normal
Choroid Plexus:        Previously seen        Stomach:                Appears normal, left
sided
Cerebellum:            Previously seen        Abdomen:                Appears normal
Posterior Fossa:       Previously seen        Abdominal Wall:         Previously seen
Nuchal Fold:           Previously seen        Cord Vessels:           Previously seen
Face:                  Orbits and profile     Kidneys:                Appear normal
previously seen
Lips:                  Previously seen        Bladder:                Appears normal
Thoracic:              Appears normal         Spine:                  Previously seen
Heart:                 Previously seen        Upper Extremities:      Previously seen
RVOT:                  Previously seen        Lower Extremities:      Previously seen
LVOT:                  Previously seen

Other:  Fetus appears to be a male. Heels previously seen. Nasal bone
previously seen.
Cervix Uterus Adnexa

Cervix
Not visualized (advanced GA >05wks)

Uterus
No abnormality visualized.

Left Ovary
Not visualized.

Right Ovary
Not visualized.

Adnexa:       No abnormality visualized.
Impression

Singleton intrauterine pregnancy at 35+1 weeks with history
of previous IUFD here for BPP and growth
Interval review of the anatomy shows no sonographic
markers for aneuploidy or structural anomalies
All relevant fetal anatomy has been visualized
Amniotic fluid volume is normal
Estimated fetal weight shows growth at >90th percentile
BPP [DATE]
Recommendations

Continue weekly BPPs
Recheck growth in 3 weeks if undelivered

## 2019-09-22 ENCOUNTER — Inpatient Hospital Stay (HOSPITAL_COMMUNITY)
Admission: AD | Admit: 2019-09-22 | Discharge: 2019-09-22 | Disposition: A | Discharge status: Home / Self Care | Payer: 59 | Attending: Family Medicine | Admitting: Family Medicine

## 2019-09-22 ENCOUNTER — Other Ambulatory Visit: Payer: Self-pay

## 2019-09-22 DIAGNOSIS — O26892 Other specified pregnancy related conditions, second trimester: Secondary | ICD-10-CM | POA: Diagnosis not present

## 2019-09-22 DIAGNOSIS — Z3689 Encounter for other specified antenatal screening: Secondary | ICD-10-CM | POA: Insufficient documentation

## 2019-09-22 DIAGNOSIS — Z3A27 27 weeks gestation of pregnancy: Secondary | ICD-10-CM

## 2019-09-22 DIAGNOSIS — M545 Low back pain: Secondary | ICD-10-CM | POA: Insufficient documentation

## 2019-09-22 DIAGNOSIS — M549 Dorsalgia, unspecified: Secondary | ICD-10-CM

## 2019-09-22 LAB — WET PREP, GENITAL
Clue Cells Wet Prep HPF POC: NONE SEEN
Sperm: NONE SEEN
Trich, Wet Prep: NONE SEEN
Yeast Wet Prep HPF POC: NONE SEEN

## 2019-09-22 LAB — URINALYSIS, ROUTINE W REFLEX MICROSCOPIC
Bilirubin Urine: NEGATIVE
Glucose, UA: NEGATIVE mg/dL
Hgb urine dipstick: NEGATIVE
Ketones, ur: NEGATIVE mg/dL
Leukocytes,Ua: NEGATIVE
Nitrite: NEGATIVE
Protein, ur: NEGATIVE mg/dL
Specific Gravity, Urine: 1.012 (ref 1.005–1.030)
pH: 6 (ref 5.0–8.0)

## 2019-09-22 LAB — POCT FERN TEST: POCT Fern Test: NEGATIVE

## 2019-09-22 LAB — AMNISURE RUPTURE OF MEMBRANE (ROM) NOT AT ARMC: Amnisure ROM: NEGATIVE

## 2019-09-22 NOTE — MAU Note (Signed)
Pt is  G5P2 at 27.6 weeks c/o back pain in lower back that stared last week, pelvic pain and pressure that started today, pain and discomfort when urinating and possible gushes of fluid x3 today.  Pt reports no other medical or OB concern.

## 2019-09-22 NOTE — MAU Provider Note (Signed)
History     CSN: [id number]  Arrival date and time: 09/22/19 1737   First Provider Initiated Contact with Patient 09/22/19 1825     Chief Complaint  Patient presents with  . Back Pain  . Pelvic Pressure   HPI Kari [name] is a 23 y.o. [phone] at [redacted]w[redacted]d who presents to MAU with chief complaints of pelvic pressure and back pain.  She denies vaginal bleeding, leaking of fluid, decreased fetal movement, fever, falls, or recent illness.   Back Pain This is a recurring problem, onset within the past week. Patient endorses crampy low back pain which she rates as 7/10. Her pain does not radiate. She denies alleviating factors. Her discomfort was intensified today when she shopped and walked for about one hour. She has not taken medication or tried other treatments for this complaint. She declines pain medicine in MAU.  Perineal Pressure and "Gush of Fluid" Patient endorses constant perineal pressure. This is a new problem, onset today. She states the sense of pressure in her vagina intensifies with fetal movement. She also reports 3-4 gushes of fluid around 2 or 3pm today. She states that a gush happens ever time baby moves. She denies continuous leaking of fluid following those gushes. She does not believe she PROM'd and denies issues with urinary incontinence.   OB History    Gravida  5   Para  3   Term  2   Preterm  1   AB  1   Living  2     SAB  1   TAB      Ectopic      Multiple  0   Live Births  2           Past Medical History:  Diagnosis Date  . Anemia   . Anxiety   . Depression   . Headache    migrains  . IUFD (intrauterine fetal death) 04-14-15    Past Surgical History:  Procedure Laterality Date  . APPENDECTOMY    . HERNIA REPAIR      Family History  Problem Relation Age of Onset  . Alcohol abuse Neg Hx   . Arthritis Neg Hx   . Asthma Neg Hx   . Birth defects Neg Hx   . Cancer Neg Hx   . COPD Neg Hx   . Depression Neg Hx   . Diabetes  Neg Hx   . Drug abuse Neg Hx   . Early death Neg Hx   . Hearing loss Neg Hx   . Heart disease Neg Hx   . Hyperlipidemia Neg Hx   . Hypertension Neg Hx   . Kidney disease Neg Hx   . Learning disabilities Neg Hx   . Mental illness Neg Hx   . Mental retardation Neg Hx   . Miscarriages / Stillbirths Neg Hx   . Stroke Neg Hx   . Vision loss Neg Hx   . Varicose Veins Neg Hx     Social History   Tobacco Use  . Smoking status: Former Games developer  . Smokeless tobacco: Never Used  . Tobacco comment: prior to +UPT  Substance Use Topics  . Alcohol use: No  . Drug use: No    Allergies: No Known Allergies  Medications Prior to Admission  Medication Sig Dispense Refill Last Dose  . Cetirizine HCl 10 MG CAPS Take 1 capsule (10 mg total) by mouth daily for 10 days. (Patient not taking: Reported on 07/22/2019) 10 capsule 0   .  fluticasone (FLONASE) 50 MCG/ACT nasal spray Place 1-2 sprays into both nostrils daily for 7 days. (Patient not taking: Reported on 07/22/2019) 1 g 0   . Prenatal Vit-Fe Fumarate-FA (PRENATAL VITAMIN PO) Take by mouth.       Review of Systems  Constitutional: Negative for chills, fatigue and fever.  Gastrointestinal: Negative for abdominal pain.  Genitourinary: Positive for vaginal discharge. Negative for difficulty urinating, dysuria, flank pain, vaginal bleeding and vaginal pain.       + Perineal pressure  Musculoskeletal: Positive for back pain.  All other systems reviewed and are negative.  Physical Exam   Blood pressure (!) 105/54, pulse 97, temperature 97.8 F (36.6 C), temperature source Oral, resp. rate 20, height 5\' 2"  (1.575 m), weight 58.1 kg, last menstrual period 02/04/2019, SpO2 100 %, currently breastfeeding.  Physical Exam  Nursing note and vitals reviewed. Constitutional: She is oriented to person, place, and time. She appears well-developed and well-nourished.  Cardiovascular: Normal rate.  Respiratory: Effort normal.  GI: She exhibits no  distension. There is no abdominal tenderness. There is no rebound and no guarding.  Gravid  Genitourinary:    Uterus normal.     Vaginal discharge present.     Genitourinary Comments: Cervix visually closed on sterile speculum exam. Small amount of white discharge noted. No bleeding, bimanual not performed   Neurological: She is alert and oriented to person, place, and time.  Skin: Skin is warm and dry.  Psychiatric: She has a normal mood and affect. Her behavior is normal. Judgment and thought content normal.    MAU Course/MDM  Procedures   --Negative pooling, negative gush with Valsalva, negative Fern, negative Amnisure --Reactive tracing: baseline 150, moderate variability, positive 10 x 10 accels --Toco: rare UI --Low back pain resolved without medication  Patient Vitals for the past 24 hrs:  BP Temp Temp src Pulse Resp SpO2 Height Weight  09/22/19 1923 110/63 98.2 F (36.8 C) Oral (!) 103 18 100 % - -  09/22/19 1810 - 97.8 F (36.6 C) Oral - - - - -  09/22/19 1806 (!) 105/54 - - 97 - - - -  09/22/19 1751 106/61 - Oral 97 20 100 % 5\' 2"  (1.575 m) 58.1 kg   Results for orders placed or performed during the hospital encounter of 09/22/19 (from the past 24 hour(s))  Amnisure rupture of membrane (rom)not at [facility]     Status: None   Collection Time: 09/22/19  6:34 PM  Result Value Ref Range   Amnisure ROM NEGATIVE   Wet prep, genital     Status: Abnormal   Collection Time: 09/22/19  6:34 PM  Result Value Ref Range   Yeast Wet Prep HPF POC NONE SEEN NONE SEEN   Trich, Wet Prep NONE SEEN NONE SEEN   Clue Cells Wet Prep HPF POC NONE SEEN NONE SEEN   WBC, Wet Prep HPF POC MANY (A) NONE SEEN   Sperm NONE SEEN   Urinalysis, Routine w reflex microscopic     Status: None   Collection Time: 09/22/19  6:37 PM  Result Value Ref Range   Color, Urine YELLOW YELLOW   APPearance CLEAR CLEAR   Specific Gravity, Urine 1.012 1.005 - 1.030   pH 6.0 5.0 - 8.0   Glucose, UA NEGATIVE NEGATIVE  mg/dL   Hgb urine dipstick NEGATIVE NEGATIVE   Bilirubin Urine NEGATIVE NEGATIVE   Ketones, ur NEGATIVE NEGATIVE mg/dL   Protein, ur NEGATIVE NEGATIVE mg/dL   Nitrite NEGATIVE NEGATIVE  Leukocytes,Ua NEGATIVE NEGATIVE  Fern Test     Status: Normal   Collection Time: 09/22/19  6:58 PM  Result Value Ref Range   POCT Fern Test Negative = intact amniotic membranes     Assessment and Plan  --23 y.o. [id number] at 8486w6d  --Reactive tracing --Intact amniotic sac --Back pain spontaneously resolved --Discharge home in stable condition  [facility] 09/29/19  Calvert CantorSamantha C Nayelie Gionfriddo, CNM 09/22/2019, 7:51 PM

## 2019-09-22 NOTE — Discharge Instructions (Signed)

## 2019-09-22 NOTE — MAU Note (Signed)
Presents with c/o lower back pain & pelvic pressure. Denies dysuria or burning.  Denies VB or LOF.  Endorses +FM.

## 2019-09-23 IMAGING — US US MFM FETAL BPP W/ NON-STRESS
1 series · 13 of 17 positions shown · non-contrast
Comparison: none

[Series 1: us mfm fetal bpp w/ non-stress · 17 acquisitions, 13 frames shown]
[im 1/17]
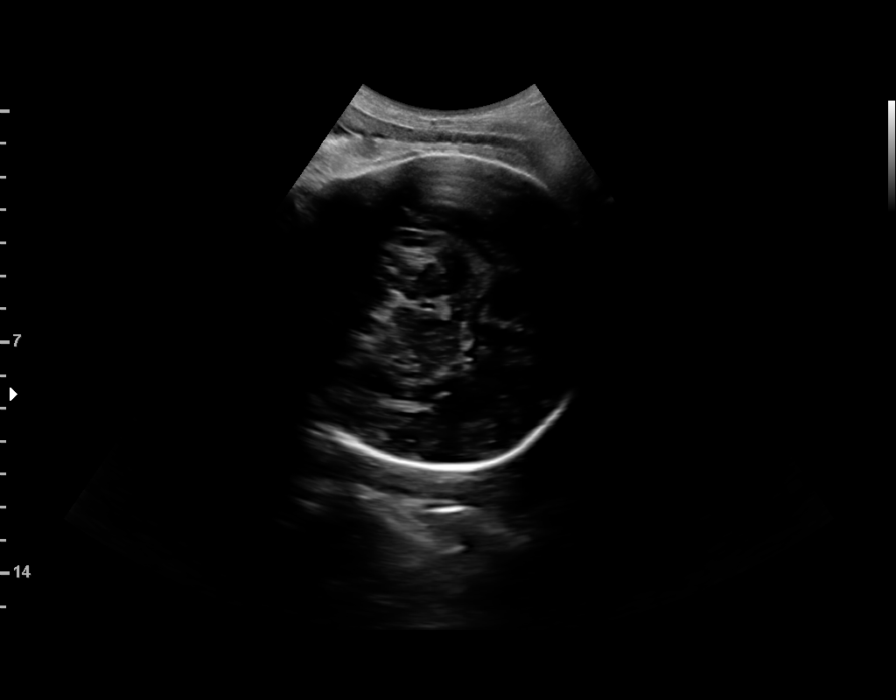
[im 2/17]
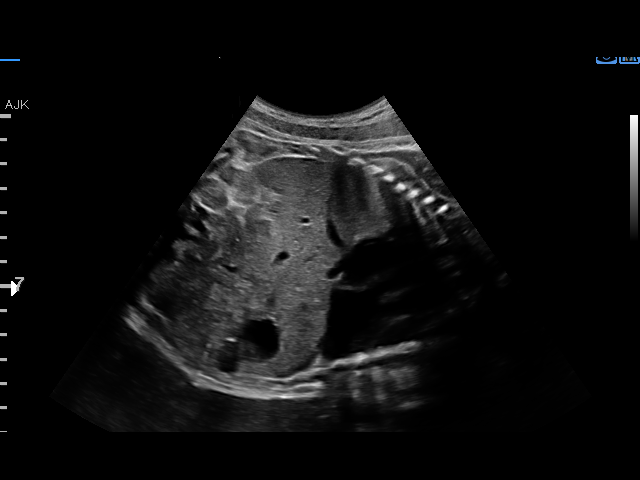
[im 4/17]
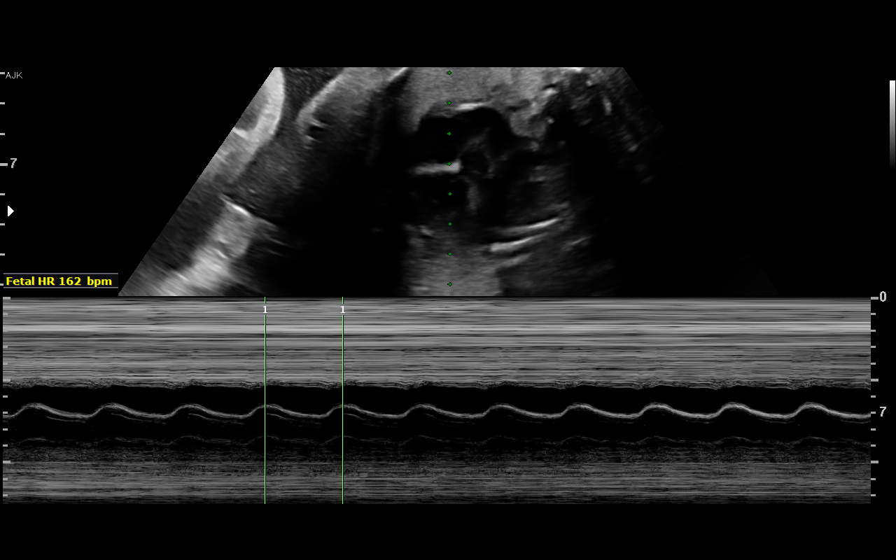
[im 5/17]
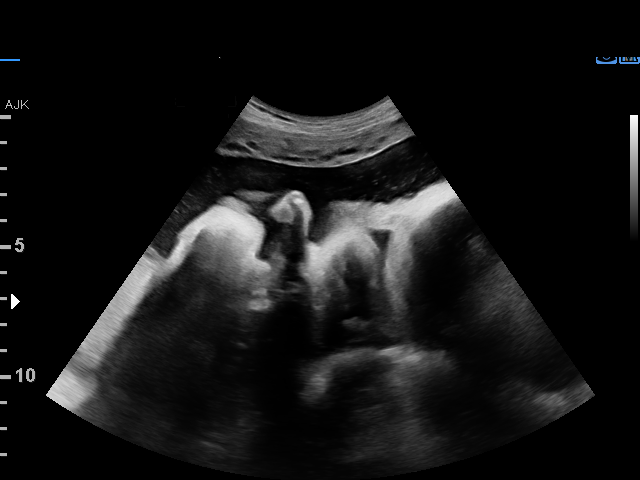
[im 6/17]
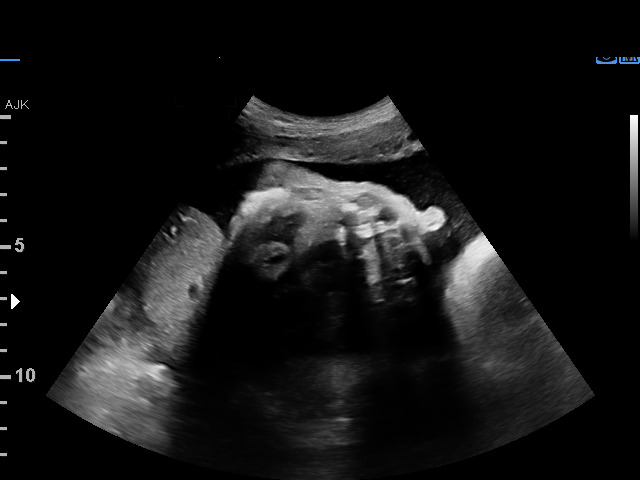
[im 8/17]
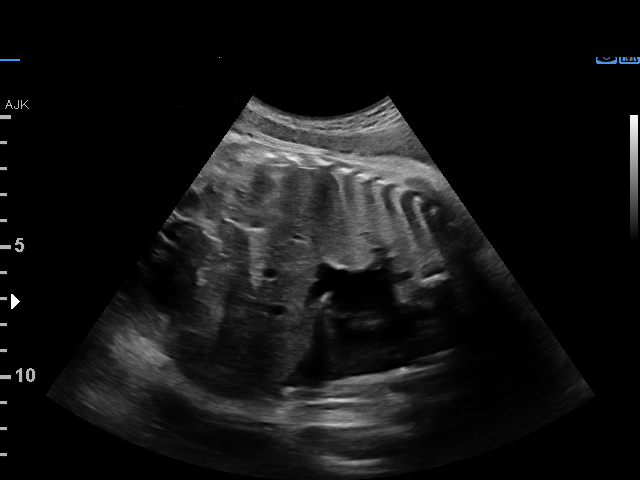
[im 9/17]
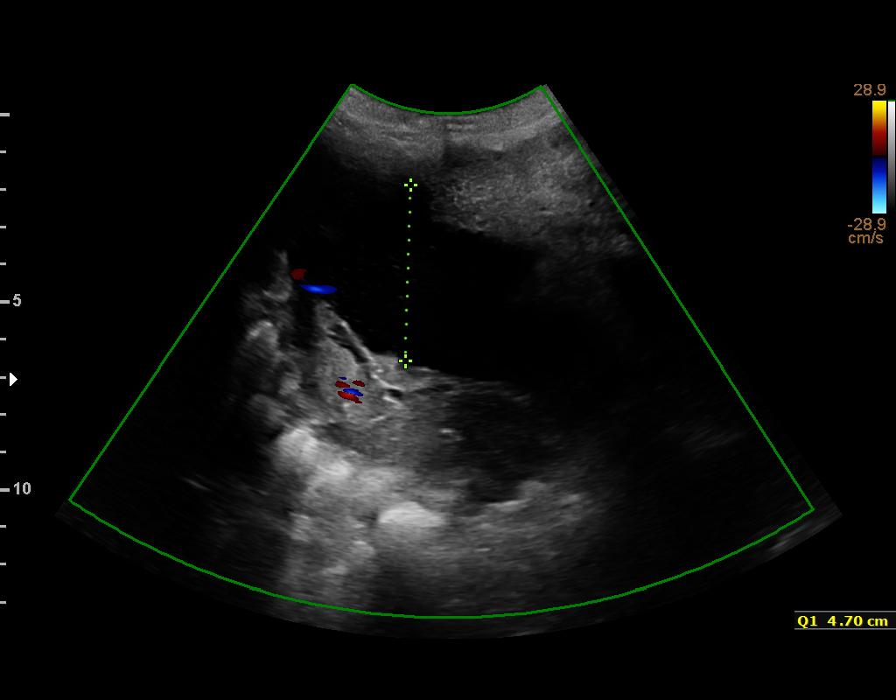
[im 10/17]
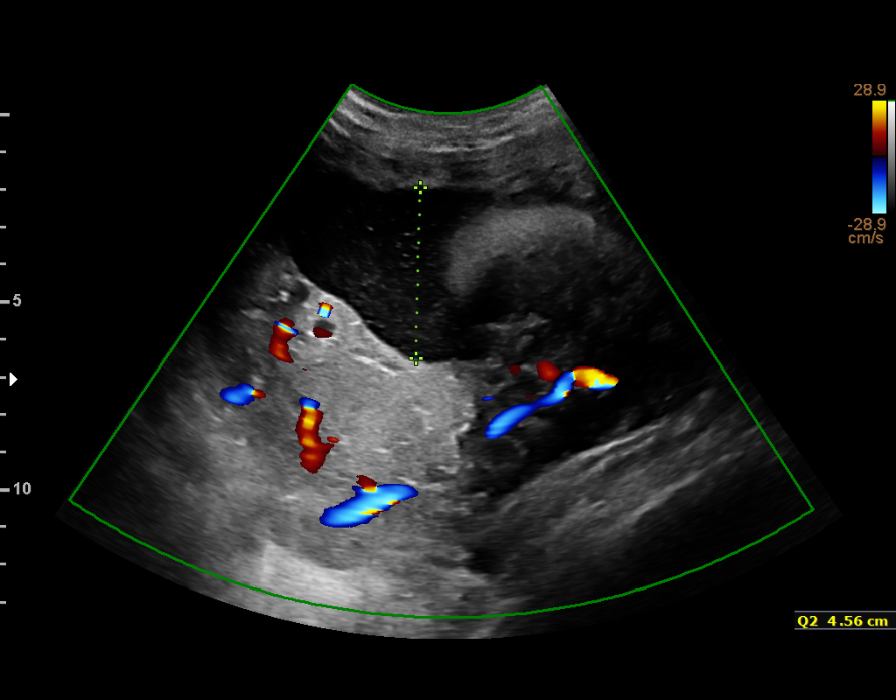
[im 12/17]
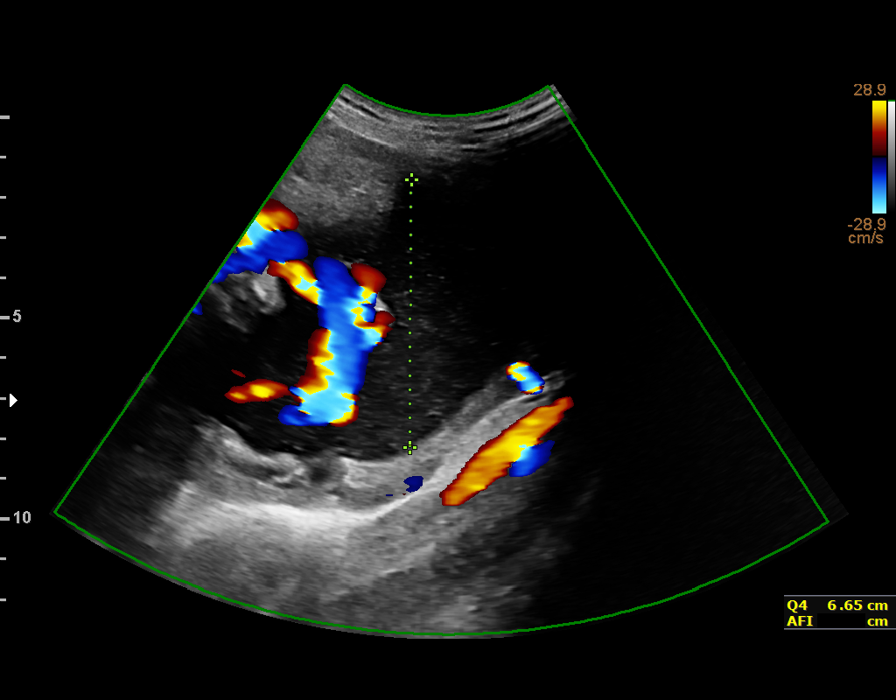
[im 13/17]
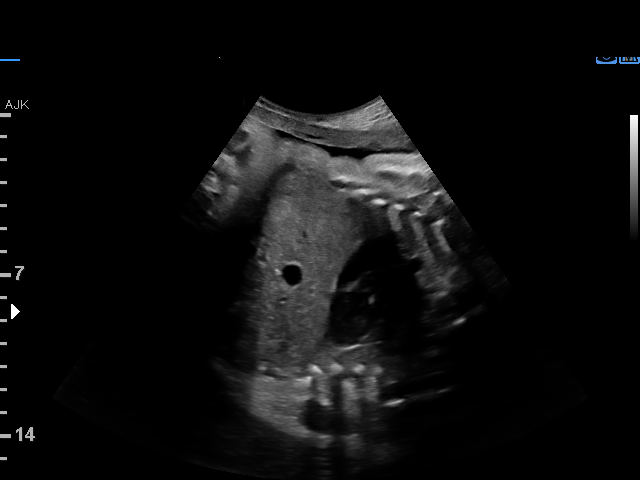
[im 14/17]
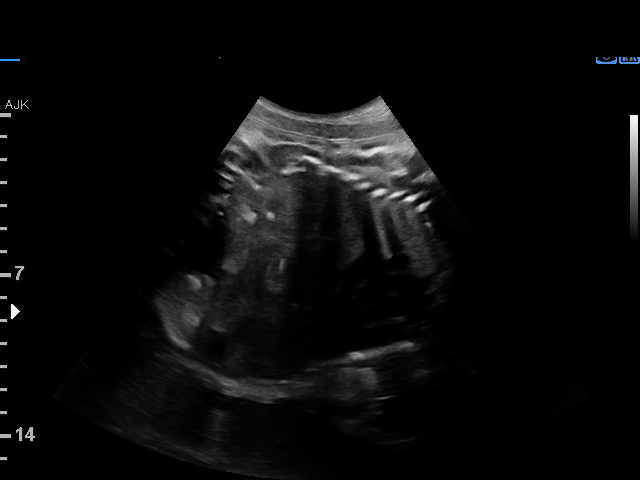
[im 16/17]
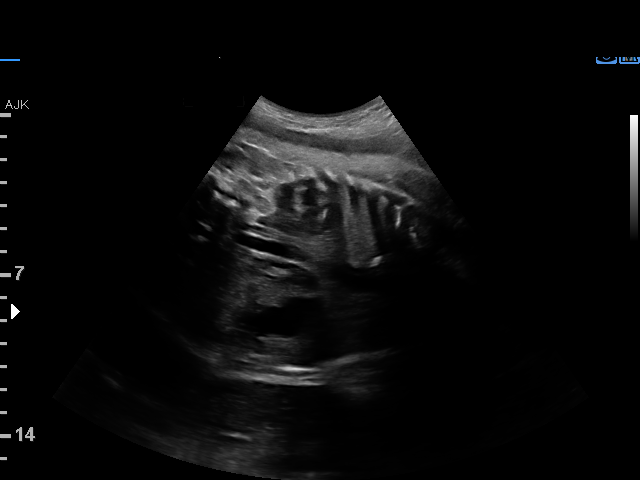
[im 17/17]
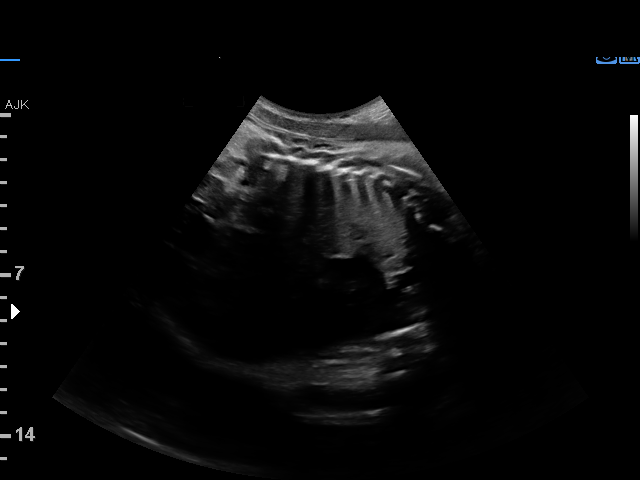

[13 of 17 positions shown; findings below may reference images not displayed]

pm)

Road [HOSPITAL]

Indications

37 weeks gestation of pregnancy
Poor obstetric history: Previous IUFD
(stillbirth) at 30 weeks
OB History

Blood Type:            Height:  5'2"   Weight (lb):  123       BMI:
Gravidity:    4         Term:   1        Prem:   1        SAB:   1
Living:       1
Fetal Evaluation

Num Of Fetuses:     1
Fetal Heart         162
Rate(bpm):
Cardiac Activity:   Observed
Presentation:       Cephalic

Amniotic Fluid
AFI FV:      Subjectively within normal limits

AFI Sum(cm)     %Tile       Largest Pocket(cm)
18.16           69

RUQ(cm)       RLQ(cm)       LUQ(cm)        LLQ(cm)
4.7
Biophysical Evaluation

Amniotic F.V:   Within normal limits       F. Tone:        Observed
F. Movement:    Observed                   N.S.T:          Reactive
F. Breathing:   Observed                   Score:          [DATE]
Gestational Age

LMP:           37w 1d        Date:  10/16/17                 EDD:   07/23/18
Best:          37w 1d     Det. By:  LMP  (10/16/17)          EDD:   07/23/18
Impression

History of stillbirth at 30 weeks.
Antenatal testing is reassuring. Amniotic fluid is normal and
good fetal activity is seen. Cephalic presentation.
BPP [DATE]. Reactive NST.
Recommendations

-Continue weekly antenatal testing till delivery.
-Consider delivery at 39 weeks.

## 2019-09-24 ENCOUNTER — Inpatient Hospital Stay (HOSPITAL_COMMUNITY)
Admission: AD | Admit: 2019-09-24 | Discharge: 2019-09-24 | Disposition: A | Discharge status: Home / Self Care | Payer: 59 | Source: Ambulatory Visit | Attending: Obstetrics and Gynecology | Admitting: Obstetrics and Gynecology

## 2019-09-24 ENCOUNTER — Other Ambulatory Visit: Payer: Self-pay

## 2019-09-24 ENCOUNTER — Encounter (HOSPITAL_COMMUNITY): Payer: Self-pay | Admitting: *Deleted

## 2019-09-24 DIAGNOSIS — Z87891 Personal history of nicotine dependence: Secondary | ICD-10-CM | POA: Diagnosis not present

## 2019-09-24 DIAGNOSIS — Z3A28 28 weeks gestation of pregnancy: Secondary | ICD-10-CM | POA: Insufficient documentation

## 2019-09-24 DIAGNOSIS — Z8759 Personal history of other complications of pregnancy, childbirth and the puerperium: Secondary | ICD-10-CM | POA: Diagnosis not present

## 2019-09-24 DIAGNOSIS — O479 False labor, unspecified: Secondary | ICD-10-CM

## 2019-09-24 NOTE — Discharge Instructions (Signed)
Preterm Labor and Birth Information °Pregnancy normally lasts 39-41 weeks. Preterm labor is when labor starts early. It starts before you have been pregnant for 37 whole weeks. °What are the risk factors for preterm labor? °Preterm labor is more likely to occur in women who: °· Have an infection while pregnant. °· Have a cervix that is short. °· Have gone into preterm labor before. °· Have had surgery on their cervix. °· Are younger than age 22. °· Are older than age 35. °· Are African American. °· Are pregnant with two or more babies. °· Take street drugs while pregnant. °· Smoke while pregnant. °· Do not gain enough weight while pregnant. °· Got pregnant right after another pregnancy. °What are the symptoms of preterm labor? °Symptoms of preterm labor include: °· Cramps. The cramps may feel like the cramps some women get during their period. The cramps may happen with watery poop (diarrhea). °· Pain in the belly (abdomen). °· Pain in the lower back. °· Regular contractions or tightening. It may feel like your belly is getting tighter. °· Pressure in the lower belly that seems to get stronger. °· More fluid (discharge) leaking from the vagina. The fluid may be watery or bloody. °· Water breaking. °Why is it important to notice signs of preterm labor? °Babies who are born early may not be fully developed. They have a higher chance for: °· Long-term heart problems. °· Long-term lung problems. °· Trouble controlling body systems, like breathing. °· Bleeding in the brain. °· A condition called cerebral palsy. °· Learning difficulties. °· Death. °These risks are highest for babies who are born before 34 weeks of pregnancy. °How is preterm labor treated? °Treatment depends on: °· How long you were pregnant. °· Your condition. °· The health of your baby. °Treatment may involve: °· Having a stitch (suture) placed in your cervix. When you give birth, your cervix opens so the baby can come out. The stitch keeps the cervix  from opening too soon. °· Staying at the hospital. °· Taking or getting medicines, such as: °? Hormone medicines. °? Medicines to stop contractions. °? Medicines to help the baby’s lungs develop. °? Medicines to prevent your baby from having cerebral palsy. °What should I do if I am in preterm labor? °If you think you are going into labor too soon, call your doctor right away. °How can I prevent preterm labor? °· Do not use any tobacco products. °? Examples of these are cigarettes, chewing tobacco, and e-cigarettes. °? If you need help quitting, ask your doctor. °· Do not use street drugs. °· Do not use any medicines unless you ask your doctor if they are safe for you. °· Talk with your doctor before taking any herbal supplements. °· Make sure you gain enough weight. °· Watch for infection. If you think you might have an infection, get it checked right away. °· If you have gone into preterm labor before, tell your doctor. °This information is not intended to replace advice given to you by your health care provider. Make sure you discuss any questions you have with your health care provider. °Document Released: 03/08/2009 Document Revised: 04/03/2019 Document Reviewed: 05/02/2016 °Elsevier Patient Education © 2020 [employer] ° °

## 2019-09-24 NOTE — MAU Note (Signed)
Pt reports to MAU c/o ctx every 10 min. Pt reports the pain is a 7-10/10. No bleeding or LOF. +FM.

## 2019-09-24 NOTE — MAU Provider Note (Signed)
Chief Complaint:  Contractions   First Provider Initiated Contact with Patient 09/24/19 0144      HPI: Kari [name] is a 23 y.o. [id number] at 54w1dwho presents to maternity admissions reporting contractions every 10 minutes.  States the first one hurt. Others less so.  Has a history of one preterm birth. She reports good fetal movement, denies LOF, vaginal bleeding, vaginal itching/burning, urinary symptoms, h/a, dizziness, n/v, diarrhea, constipation or fever/chills.    Was seen here yesterday for pelvic pressure,  UA was clear and cervix closed  Was treated for BV  RN Note: Pt reports to MAU c/o ctx every 10 min. Pt reports the pain is a 7-10/10. No bleeding or LOF. +FM.   Past Medical History: Past Medical History:  Diagnosis Date  . Anemia   . Anxiety   . Depression   . Headache    migrains  . IUFD (intrauterine fetal death) 04-22-15    Past obstetric history: OB History  Gravida Para Term Preterm AB Living  5 3 2 1 1 2   SAB TAB Ectopic Multiple Live Births  1     0 2    # Outcome Date GA Lbr Len/2nd Weight Sex Delivery Anes PTL Lv  5 Current           4 Term 07/13/18 [redacted]w[redacted]d 07:44 / 00:16 3735 g [redacted]w[redacted]d EPI  LIV  3 Term 02/27/17 [redacted]w[redacted]d 06:44 / 00:12 3374 g M Vag-Spont EPI  LIV  2 SAB 01/2016          1 Preterm 10/08/15 [redacted]w[redacted]d 03:20 / 00:02 1385 g M Vag-Spont EPI  FD     Birth Comments: none    Past Surgical History: Past Surgical History:  Procedure Laterality Date  . APPENDECTOMY    . HERNIA REPAIR      Family History: Family History  Problem Relation Age of Onset  . Alcohol abuse Neg Hx   . Arthritis Neg Hx   . Asthma Neg Hx   . Birth defects Neg Hx   . Cancer Neg Hx   . COPD Neg Hx   . Depression Neg Hx   . Diabetes Neg Hx   . Drug abuse Neg Hx   . Early death Neg Hx   . Hearing loss Neg Hx   . Heart disease Neg Hx   . Hyperlipidemia Neg Hx   . Hypertension Neg Hx   . Kidney disease Neg Hx   . Learning disabilities Neg Hx   . Mental illness  Neg Hx   . Mental retardation Neg Hx   . Miscarriages / Stillbirths Neg Hx   . Stroke Neg Hx   . Vision loss Neg Hx   . Varicose Veins Neg Hx     Social History: Social History   Tobacco Use  . Smoking status: Former [redacted]w[redacted]d  . Smokeless tobacco: Never Used  . Tobacco comment: prior to +UPT  Substance Use Topics  . Alcohol use: No  . Drug use: No    Allergies: No Known Allergies  Meds:  Medications Prior to Admission  Medication Sig Dispense Refill Last Dose  . Prenatal Vit-Fe Fumarate-FA (PRENATAL VITAMIN PO) Take 1 tablet by mouth daily.    09/23/2019 at Unknown time    I have reviewed patient's Past Medical Hx, Surgical Hx, Family Hx, Social Hx, medications and allergies.   ROS:  Review of Systems  Constitutional: Negative for chills and fever.  Respiratory: Negative for shortness of breath.   Gastrointestinal:  Negative for constipation, diarrhea and nausea.  Genitourinary: Positive for pelvic pain. Negative for vaginal bleeding.  Musculoskeletal: Negative for back pain.   Other systems negative  Physical Exam   Patient Vitals for the past 24 hrs:  BP Temp Temp src Pulse Resp Weight  09/24/19 0119 110/66 98.1 F (36.7 C) Oral (!) 101 16 58.5 kg   Constitutional: Well-developed, well-nourished female in no acute distress.  Cardiovascular: normal rate and rhythm Respiratory: normal effort, clear to auscultation bilaterally GI: Abd soft, non-tender, gravid appropriate for gestational age.   No rebound or guarding. MS: Extremities nontender, no edema, normal ROM Neurologic: Alert and oriented x 4.  GU: Neg CVAT.  PELVIC EXAM:  Dilation: Closed Effacement (%): 0 Cervical Position: Posterior Station: Ballotable Presentation: Vertex Exam by:: Jimmye Norman CNM   FHT:  Baseline 145 , moderate variability, accelerations present, no decelerations Contractions: 2 contractions seen in entire stay   Labs: No results found for this or any previous visit (from the past  24 hour(s)). UA was totally clear the day before B/Positive/-- (06/01 1109)  Imaging:    MAU Course/MDM: I have ordered labs and reviewed results. UA clear yesterday NST reviewed, reassuring with only 2 contractions.  Treatments in MAU included EFM.    Assessment: Single intrauterine pregnancy at [redacted]w[redacted]d Occasional preterm contractions Cervix long and closed  Plan: Discharge home Preterm Labor precautions and fetal kick counts Follow up in Office for prenatal visits and recheck of status  Encouraged to return here or to other Urgent Care/ED if she develops worsening of symptoms, increase in pain, fever, or other concerning symptoms.   Pt stable at time of discharge.  Hansel Feinstein CNM, MSN Certified Nurse-Midwife 09/24/2019 1:44 AM

## 2019-09-25 NOTE — Progress Notes (Signed)
f °

## 2019-09-29 ENCOUNTER — Ambulatory Visit (HOSPITAL_COMMUNITY)
Admission: RE | Admit: 2019-09-29 | Discharge: 2019-09-29 | Disposition: A | Discharge status: Home / Self Care | Payer: 59 | Source: Ambulatory Visit | Attending: Obstetrics and Gynecology | Admitting: Obstetrics and Gynecology

## 2019-09-29 ENCOUNTER — Ambulatory Visit (INDEPENDENT_AMBULATORY_CARE_PROVIDER_SITE_OTHER): Payer: 59

## 2019-09-29 ENCOUNTER — Other Ambulatory Visit: Payer: 59

## 2019-09-29 ENCOUNTER — Other Ambulatory Visit (HOSPITAL_COMMUNITY): Payer: Self-pay | Admitting: *Deleted

## 2019-09-29 ENCOUNTER — Ambulatory Visit (HOSPITAL_COMMUNITY): Payer: 59 | Admitting: *Deleted

## 2019-09-29 ENCOUNTER — Encounter (HOSPITAL_COMMUNITY): Payer: Self-pay

## 2019-09-29 ENCOUNTER — Other Ambulatory Visit: Payer: Self-pay

## 2019-09-29 VITALS — BP 101/57 | HR 97 | Temp 98.7°F

## 2019-09-29 VITALS — BP 104/64 | HR 103 | Wt 128.0 lb

## 2019-09-29 DIAGNOSIS — Z148 Genetic carrier of other disease: Secondary | ICD-10-CM

## 2019-09-29 DIAGNOSIS — Z362 Encounter for other antenatal screening follow-up: Secondary | ICD-10-CM

## 2019-09-29 DIAGNOSIS — O0993 Supervision of high risk pregnancy, unspecified, third trimester: Secondary | ICD-10-CM

## 2019-09-29 DIAGNOSIS — Z8759 Personal history of other complications of pregnancy, childbirth and the puerperium: Secondary | ICD-10-CM

## 2019-09-29 DIAGNOSIS — O0990 Supervision of high risk pregnancy, unspecified, unspecified trimester: Secondary | ICD-10-CM

## 2019-09-29 DIAGNOSIS — O09293 Supervision of pregnancy with other poor reproductive or obstetric history, third trimester: Secondary | ICD-10-CM

## 2019-09-29 DIAGNOSIS — Z3A28 28 weeks gestation of pregnancy: Secondary | ICD-10-CM

## 2019-09-29 DIAGNOSIS — Z862 Personal history of diseases of the blood and blood-forming organs and certain disorders involving the immune mechanism: Secondary | ICD-10-CM

## 2019-09-29 NOTE — Progress Notes (Signed)
   PRENATAL VISIT NOTE  Subjective:  Kari [name] is a 23 y.o. [phone] at [redacted]w[redacted]d who presents today for routine prenatal care.  She is currently being monitored for supervision of a high-risk pregnancy with problems as listed below.  Patient reports pelvic pressure and pain, but states she has recently ordered a pregnancy support band. She denies contractions, vaginal concerns, and endorses fetal movement.  Patient expresses a desire for depo provera prior to discharge.   Patient Active Problem List   Diagnosis Date Noted  . Alpha thalassemia silent carrier 06/08/2019  . Sickle cell trait ([place]) 05/25/2019  . Supervision of high risk pregnancy, antepartum 04/27/2019  . Vitamin D deficiency 01/22/2018  . History of IUFD 10/07/2015    The following portions of the patient's history were reviewed and updated as appropriate: allergies, current medications, past family history, past medical history, past social history, past surgical history and problem list. Problem list updated.  Objective:   Vitals:   09/29/19 0925  BP: 104/64  Pulse: (!) 103  Weight: 128 lb (58.1 kg)    Fetal Status: Fetal Heart Rate (bpm): 140 Fundal Height: 28 cm Movement: Present     General:  Alert, oriented and cooperative. Patient is in no acute distress.  Skin: Skin is warm and dry.   Cardiovascular: Regular rate and rhythm.  Respiratory: Normal respiratory effort. CTA-Bilaterally  Abdomen: Soft, gravid, appropriate for gestational age.  Pelvic: Cervical exam deferred        Extremities: Normal range of motion.  Edema: None  Mental Status: Normal mood and affect. Normal behavior. Normal judgment and thought content.   Assessment and Plan:  Pregnancy: [id number] at 108w6d  1. Supervision of high risk pregnancy, antepartum -GTT rescheduled as patient ate prior to arrival and throughout the night.  -Discussed usage of pregnancy support band, rest, hydration, and tylenol as needed for aches and pains.  -Informed that depo provera can be given prior to discharge from hospital if desired.  -Anticipatory guidance for upcoming appts.   2. History of IUFD -Antenatal testing today -Reviewed fetal movement and encouraged to report to MAU for any concerns.  Preterm labor symptoms and general obstetric precautions including but not limited to vaginal bleeding, contractions, leaking of fluid and fetal movement were reviewed with the patient.  Please refer to After Visit Summary for other counseling recommendations.  Return in about 2 weeks (around 10/13/2019) for HR-ROB via [facility].  Future Appointments  Date Time Provider [place]  09/29/2019  1:10 PM [place] [facility]  09/29/2019  1:15 PM [place] 4 [facility]  10/05/2019  8:15 AM [facility] None  10/13/2019  1:45 PM Constant, Vickii Chafe, MD [place] None    Maryann Conners, CNM 09/29/2019, 11:06 AM

## 2019-09-29 NOTE — Patient Instructions (Signed)
Medroxyprogesterone injection [Contraceptive] What is this medicine? MEDROXYPROGESTERONE (me DROX ee proe JES te rone) contraceptive injections prevent pregnancy. They provide effective birth control for 3 months. Depo-subQ Provera 104 is also used for treating pain related to endometriosis. This medicine may be used for other purposes; ask your health care provider or pharmacist if you have questions. COMMON BRAND NAME(S): Depo-Provera, Depo-subQ Provera 104 What should I tell my health care provider before I take this medicine? They need to know if you have any of these conditions:  frequently drink alcohol  asthma  blood vessel disease or a history of a blood clot in the lungs or legs  bone disease such as osteoporosis  breast cancer  diabetes  eating disorder (anorexia nervosa or bulimia)  high blood pressure  HIV infection or AIDS  kidney disease  liver disease  mental depression  migraine  seizures (convulsions)  stroke  tobacco smoker  vaginal bleeding  an unusual or allergic reaction to medroxyprogesterone, other hormones, medicines, foods, dyes, or preservatives  pregnant or trying to get pregnant  breast-feeding How should I use this medicine? Depo-Provera Contraceptive injection is given into a muscle. Depo-subQ Provera 104 injection is given under the skin. These injections are given by a health care professional. You must not be pregnant before getting an injection. The injection is usually given during the first 5 days after the start of a menstrual period or 6 weeks after delivery of a baby. Talk to your pediatrician regarding the use of this medicine in children. Special care may be needed. These injections have been used in female children who have started having menstrual periods. Overdosage: If you think you have taken too much of this medicine contact a poison control center or emergency room at once. NOTE: This medicine is only for you. Do not  share this medicine with others. What if I miss a dose? Try not to miss a dose. You must get an injection once every 3 months to maintain birth control. If you cannot keep an appointment, call and reschedule it. If you wait longer than 13 weeks between Depo-Provera contraceptive injections or longer than 14 weeks between Depo-subQ Provera 104 injections, you could get pregnant. Use another method for birth control if you miss your appointment. You may also need a pregnancy test before receiving another injection. What may interact with this medicine? Do not take this medicine with any of the following medications:  bosentan This medicine may also interact with the following medications:  aminoglutethimide  antibiotics or medicines for infections, especially rifampin, rifabutin, rifapentine, and griseofulvin  aprepitant  barbiturate medicines such as phenobarbital or primidone  bexarotene  carbamazepine  medicines for seizures like ethotoin, felbamate, oxcarbazepine, phenytoin, topiramate  modafinil  St. John's wort This list may not describe all possible interactions. Give your health care provider a list of all the medicines, herbs, non-prescription drugs, or dietary supplements you use. Also tell them if you smoke, drink alcohol, or use illegal drugs. Some items may interact with your medicine. What should I watch for while using this medicine? This drug does not protect you against HIV infection (AIDS) or other sexually transmitted diseases. Use of this product may cause you to lose calcium from your bones. Loss of calcium may cause weak bones (osteoporosis). Only use this product for more than 2 years if other forms of birth control are not right for you. The longer you use this product for birth control the more likely you will be at risk   for weak bones. Ask your health care professional how you can keep strong bones. You may have a change in bleeding pattern or irregular periods.  Many females stop having periods while taking this drug. If you have received your injections on time, your chance of being pregnant is very low. If you think you may be pregnant, see your health care professional as soon as possible. Tell your health care professional if you want to get pregnant within the next year. The effect of this medicine may last a long time after you get your last injection. What side effects may I notice from receiving this medicine? Side effects that you should report to your doctor or health care professional as soon as possible:  allergic reactions like skin rash, itching or hives, swelling of the face, lips, or tongue  breast tenderness or discharge  breathing problems  changes in vision  depression  feeling faint or lightheaded, falls  fever  pain in the abdomen, chest, groin, or leg  problems with balance, talking, walking  unusually weak or tired  yellowing of the eyes or skin Side effects that usually do not require medical attention (report to your doctor or health care professional if they continue or are bothersome):  acne  fluid retention and swelling  headache  irregular periods, spotting, or absent periods  temporary pain, itching, or skin reaction at site where injected  weight gain This list may not describe all possible side effects. Call your doctor for medical advice about side effects. You may report side effects to FDA at 1-800-FDA-1088. Where should I keep my medicine? This does not apply. The injection will be given to you by a health care professional. NOTE: This sheet is a summary. It may not cover all possible information. If you have questions about this medicine, talk to your doctor, pharmacist, or health care provider.  2020 Elsevier/Gold Standard (2008-12-31 18:37:56)  

## 2019-09-29 NOTE — Progress Notes (Signed)
Patient reports fetal movement with occasional pressure/pain. Pt re-scheduling glucose test due to eating.

## 2019-10-05 ENCOUNTER — Other Ambulatory Visit: Payer: 59

## 2019-10-08 ENCOUNTER — Other Ambulatory Visit: Payer: Self-pay

## 2019-10-08 ENCOUNTER — Other Ambulatory Visit: Payer: 59

## 2019-10-08 DIAGNOSIS — O0990 Supervision of high risk pregnancy, unspecified, unspecified trimester: Secondary | ICD-10-CM

## 2019-10-09 LAB — CBC
Hematocrit: 26.7 % — ABNORMAL LOW (ref 34.0–46.6)
Hemoglobin: 8.8 g/dL — ABNORMAL LOW (ref 11.1–15.9)
MCH: 24.9 pg — ABNORMAL LOW (ref 26.6–33.0)
MCHC: 33 g/dL (ref 31.5–35.7)
MCV: 75 fL — ABNORMAL LOW (ref 79–97)
Platelets: 163 10*3/uL (ref 150–450)
RBC: 3.54 x10E6/uL — ABNORMAL LOW (ref 3.77–5.28)
RDW: 12.7 % (ref 11.7–15.4)
WBC: 7.2 10*3/uL (ref 3.4–10.8)

## 2019-10-09 LAB — GLUCOSE TOLERANCE, 2 HOURS W/ 1HR
Glucose, 1 hour: 165 mg/dL (ref 65–179)
Glucose, 2 hour: 134 mg/dL (ref 65–152)
Glucose, Fasting: 69 mg/dL (ref 65–91)

## 2019-10-09 LAB — RPR: RPR Ser Ql: NONREACTIVE

## 2019-10-09 LAB — HIV ANTIBODY (ROUTINE TESTING W REFLEX): HIV Screen 4th Generation wRfx: NONREACTIVE

## 2019-10-12 MED ORDER — FERROUS SULFATE 325 (65 FE) MG PO TABS: 325.0000 mg | ORAL_TABLET | Freq: Two times a day (BID) | ORAL | 1 refills | Status: DC

## 2019-10-13 ENCOUNTER — Ambulatory Visit (HOSPITAL_COMMUNITY)
Admission: RE | Admit: 2019-10-13 | Discharge: 2019-10-13 | Disposition: A | Discharge status: Home / Self Care | Payer: 59 | Source: Ambulatory Visit | Attending: Obstetrics and Gynecology | Admitting: Obstetrics and Gynecology

## 2019-10-13 ENCOUNTER — Ambulatory Visit (HOSPITAL_COMMUNITY): Payer: 59 | Admitting: *Deleted

## 2019-10-13 ENCOUNTER — Other Ambulatory Visit: Payer: Self-pay

## 2019-10-13 ENCOUNTER — Telehealth: Payer: 59 | Admitting: Obstetrics and Gynecology

## 2019-10-13 ENCOUNTER — Encounter (HOSPITAL_COMMUNITY): Payer: Self-pay

## 2019-10-13 VITALS — BP 101/54 | HR 109 | Temp 98.5°F

## 2019-10-13 DIAGNOSIS — O0990 Supervision of high risk pregnancy, unspecified, unspecified trimester: Secondary | ICD-10-CM | POA: Insufficient documentation

## 2019-10-13 DIAGNOSIS — Z3A30 30 weeks gestation of pregnancy: Secondary | ICD-10-CM | POA: Diagnosis not present

## 2019-10-13 DIAGNOSIS — Z862 Personal history of diseases of the blood and blood-forming organs and certain disorders involving the immune mechanism: Secondary | ICD-10-CM

## 2019-10-13 DIAGNOSIS — O09293 Supervision of pregnancy with other poor reproductive or obstetric history, third trimester: Secondary | ICD-10-CM | POA: Diagnosis not present

## 2019-10-13 DIAGNOSIS — Z8759 Personal history of other complications of pregnancy, childbirth and the puerperium: Secondary | ICD-10-CM | POA: Insufficient documentation

## 2019-10-16 ENCOUNTER — Encounter (HOSPITAL_COMMUNITY): Payer: Self-pay

## 2019-10-16 ENCOUNTER — Other Ambulatory Visit: Payer: Self-pay

## 2019-10-16 ENCOUNTER — Inpatient Hospital Stay (HOSPITAL_COMMUNITY)
Admission: AD | Admit: 2019-10-16 | Discharge: 2019-10-16 | Disposition: A | Discharge status: Home / Self Care | Payer: 59 | Attending: Obstetrics and Gynecology | Admitting: Obstetrics and Gynecology

## 2019-10-16 DIAGNOSIS — B373 Candidiasis of vulva and vagina: Secondary | ICD-10-CM | POA: Diagnosis not present

## 2019-10-16 DIAGNOSIS — Z87891 Personal history of nicotine dependence: Secondary | ICD-10-CM | POA: Insufficient documentation

## 2019-10-16 DIAGNOSIS — O4703 False labor before 37 completed weeks of gestation, third trimester: Secondary | ICD-10-CM

## 2019-10-16 DIAGNOSIS — O479 False labor, unspecified: Secondary | ICD-10-CM

## 2019-10-16 DIAGNOSIS — Z3A31 31 weeks gestation of pregnancy: Secondary | ICD-10-CM

## 2019-10-16 DIAGNOSIS — Z3689 Encounter for other specified antenatal screening: Secondary | ICD-10-CM | POA: Diagnosis present

## 2019-10-16 DIAGNOSIS — O98813 Other maternal infectious and parasitic diseases complicating pregnancy, third trimester: Secondary | ICD-10-CM | POA: Insufficient documentation

## 2019-10-16 DIAGNOSIS — B3731 Acute candidiasis of vulva and vagina: Secondary | ICD-10-CM

## 2019-10-16 LAB — URINALYSIS, ROUTINE W REFLEX MICROSCOPIC
Bilirubin Urine: NEGATIVE
Glucose, UA: NEGATIVE mg/dL
Hgb urine dipstick: NEGATIVE
Ketones, ur: 20 mg/dL — AB
Nitrite: NEGATIVE
Protein, ur: NEGATIVE mg/dL
Specific Gravity, Urine: 1.013 (ref 1.005–1.030)
pH: 6 (ref 5.0–8.0)

## 2019-10-16 LAB — WET PREP, GENITAL
Clue Cells Wet Prep HPF POC: NONE SEEN
Sperm: NONE SEEN
Trich, Wet Prep: NONE SEEN
Yeast Wet Prep HPF POC: NONE SEEN

## 2019-10-16 MED ORDER — TERCONAZOLE 0.4 % VA CREA: 1.0000 | TOPICAL_CREAM | Freq: Every day | VAGINAL | 0 refills | Status: DC

## 2019-10-16 NOTE — Discharge Instructions (Signed)
Braxton Hicks Contractions Contractions of the uterus can occur throughout pregnancy, but they are not always a sign that you are in labor. You may have practice contractions called Braxton Hicks contractions. These false labor contractions are sometimes confused with true labor. What are Braxton Hicks contractions? Braxton Hicks contractions are tightening movements that occur in the muscles of the uterus before labor. Unlike true labor contractions, these contractions do not result in opening (dilation) and thinning of the cervix. Toward the end of pregnancy (32-34 weeks), Braxton Hicks contractions can happen more often and may become stronger. These contractions are sometimes difficult to tell apart from true labor because they can be very uncomfortable. You should not feel embarrassed if you go to the hospital with false labor. Sometimes, the only way to tell if you are in true labor is for your health care provider to look for changes in the cervix. The health care provider will do a physical exam and may monitor your contractions. If you are not in true labor, the exam should show that your cervix is not dilating and your water has not broken. If there are no other health problems associated with your pregnancy, it is completely safe for you to be sent home with false labor. You may continue to have Braxton Hicks contractions until you go into true labor. How to tell the difference between true labor and false labor True labor  Contractions last 30-70 seconds.  Contractions become very regular.  Discomfort is usually felt in the top of the uterus, and it spreads to the lower abdomen and low back.  Contractions do not go away with walking.  Contractions usually become more intense and increase in frequency.  The cervix dilates and gets thinner. False labor  Contractions are usually shorter and not as strong as true labor contractions.  Contractions are usually irregular.  Contractions  are often felt in the front of the lower abdomen and in the groin.  Contractions may go away when you walk around or change positions while lying down.  Contractions get weaker and are shorter-lasting as time goes on.  The cervix usually does not dilate or become thin. Follow these instructions at home:   Take over-the-counter and prescription medicines only as told by your health care provider.  Keep up with your usual exercises and follow other instructions from your health care provider.  Eat and drink lightly if you think you are going into labor.  If Braxton Hicks contractions are making you uncomfortable: ? Change your position from lying down or resting to walking, or change from walking to resting. ? Sit and rest in a tub of warm water. ? Drink enough fluid to keep your urine pale yellow. Dehydration may cause these contractions. ? Do slow and deep breathing several times an hour.  Keep all follow-up prenatal visits as told by your health care provider. This is important. Contact a health care provider if:  You have a fever.  You have continuous pain in your abdomen. Get help right away if:  Your contractions become stronger, more regular, and closer together.  You have fluid leaking or gushing from your vagina.  You pass blood-tinged mucus (bloody show).  You have bleeding from your vagina.  You have low back pain that you never had before.  You feel your baby's head pushing down and causing pelvic pressure.  Your baby is not moving inside you as much as it used to. Summary  Contractions that occur before labor are   called Braxton Hicks contractions, false labor, or practice contractions.  Braxton Hicks contractions are usually shorter, weaker, farther apart, and less regular than true labor contractions. True labor contractions usually become progressively stronger and regular, and they become more frequent.  Manage discomfort from Braxton Hicks contractions  by changing position, resting in a warm bath, drinking plenty of water, or practicing deep breathing. This information is not intended to replace advice given to you by your health care provider. Make sure you discuss any questions you have with your health care provider. Document Released: 04/25/2017 Document Revised: 11/22/2017 Document Reviewed: 04/25/2017 Elsevier Patient Education  2020 Elsevier Inc.  

## 2019-10-16 NOTE — MAU Provider Note (Signed)
History     CSN: [id number]  Arrival date and time: 10/16/19 7106   First Provider Initiated Contact with Patient 10/16/19 1931      Chief Complaint  Patient presents with  . Contractions   23 y.o. [id number] @31 .2 wks presenting with ctx. Reports onset around 6pm. Frequency q6 min. Ctx were painful. Since arrival she's only felt a few and not painful. Denies VB or LOF. Reports milky vaginal discharge and irritation for several days after having IC. No odor. No new partner. Denies urinary sx. Reports good FM.   OB History    Gravida  5   Para  3   Term  2   Preterm  1   AB  1   Living  2     SAB  1   TAB      Ectopic      Multiple  0   Live Births  2           Past Medical History:  Diagnosis Date  . Anemia   . Anxiety   . Depression   . Headache    migrains  . IUFD (intrauterine fetal death) 05-03-2015    Past Surgical History:  Procedure Laterality Date  . APPENDECTOMY    . HERNIA REPAIR      Family History  Problem Relation Age of Onset  . Alcohol abuse Neg Hx   . Arthritis Neg Hx   . Asthma Neg Hx   . Birth defects Neg Hx   . Cancer Neg Hx   . COPD Neg Hx   . Depression Neg Hx   . Diabetes Neg Hx   . Drug abuse Neg Hx   . Early death Neg Hx   . Hearing loss Neg Hx   . Heart disease Neg Hx   . Hyperlipidemia Neg Hx   . Hypertension Neg Hx   . Kidney disease Neg Hx   . Learning disabilities Neg Hx   . Mental illness Neg Hx   . Mental retardation Neg Hx   . Miscarriages / Stillbirths Neg Hx   . Stroke Neg Hx   . Vision loss Neg Hx   . Varicose Veins Neg Hx     Social History   Tobacco Use  . Smoking status: Former Research scientist (life sciences)  . Smokeless tobacco: Never Used  . Tobacco comment: prior to +UPT  Substance Use Topics  . Alcohol use: No  . Drug use: No    Allergies: No Known Allergies  Medications Prior to Admission  Medication Sig Dispense Refill Last Dose  . ferrous sulfate (FERROUSUL) 325 (65 FE) MG tablet Take 1 tablet (325 mg  total) by mouth 2 (two) times daily. 60 tablet 1 10/15/2019 at Unknown time  . Prenatal Vit-Fe Fumarate-FA (PRENATAL VITAMIN PO) Take 1 tablet by mouth daily.    10/15/2019 at Unknown time    Review of Systems  Gastrointestinal: Positive for abdominal pain (ctx).  Genitourinary: Positive for vaginal discharge. Negative for dysuria, frequency, urgency and vaginal bleeding.   Physical Exam   Blood pressure 105/61, pulse (!) 101, temperature 98.3 F (36.8 C), temperature source Axillary, resp. rate 18, height 5\' 2"  (1.575 m), weight 57.6 kg, last menstrual period 02/04/2019, SpO2 98 %, currently breastfeeding.  Physical Exam  Nursing note and vitals reviewed. Constitutional: She is oriented to person, place, and time. She appears well-developed and well-nourished. No distress.  HENT:  Head: Normocephalic and atraumatic.  Neck: Normal range of motion.  Cardiovascular: Normal  rate.  Respiratory: Effort normal. No respiratory distress.  GI: Soft. She exhibits no distension. There is no abdominal tenderness.  gravid  Genitourinary:    Genitourinary Comments: External: no lesions or erythema Vagina: rugated, pink, moist, thin yellow discharge with few clumps Cervix closed/thick    Musculoskeletal: Normal range of motion.  Neurological: She is alert and oriented to person, place, and time.  Skin: Skin is warm and dry.  Psychiatric: She has a normal mood and affect.  EFM: 145 bpm, mod variability, + accels, no decels Toco: rare  Results for orders placed or performed during the hospital encounter of 10/16/19 (from the past 24 hour(s))  Urinalysis, Routine w reflex microscopic     Status: Abnormal   Collection Time: 10/16/19  7:12 PM  Result Value Ref Range   Color, Urine YELLOW YELLOW   APPearance CLEAR CLEAR   Specific Gravity, Urine 1.013 1.005 - 1.030   pH 6.0 5.0 - 8.0   Glucose, UA NEGATIVE NEGATIVE mg/dL   Hgb urine dipstick NEGATIVE NEGATIVE   Bilirubin Urine NEGATIVE  NEGATIVE   Ketones, ur 20 (A) NEGATIVE mg/dL   Protein, ur NEGATIVE NEGATIVE mg/dL   Nitrite NEGATIVE NEGATIVE   Leukocytes,Ua SMALL (A) NEGATIVE   RBC / HPF 0-5 0 - 5 RBC/hpf   WBC, UA 11-20 0 - 5 WBC/hpf   Bacteria, UA MANY (A) NONE SEEN   Squamous Epithelial / LPF 0-5 0 - 5   Mucus PRESENT   Wet prep, genital     Status: Abnormal   Collection Time: 10/16/19  7:32 PM   Specimen: PATH Cytology Cervicovaginal Ancillary Only  Result Value Ref Range   Yeast Wet Prep HPF POC NONE SEEN NONE SEEN   Trich, Wet Prep NONE SEEN NONE SEEN   Clue Cells Wet Prep HPF POC NONE SEEN NONE SEEN   WBC, Wet Prep HPF POC FEW (A) NONE SEEN   Sperm NONE SEEN    MAU Course  Procedures  MDM Labs ordered and reviewed. UA with many bacteria, pt asymptomatic, will send culture. No evidence of PTL. Pt reports no ctx now. Will treat for yeast vaginitis based on sx and clinical exam. Stable for discharge home.   Assessment and Plan   1. [redacted] weeks gestation of pregnancy   2. NST (non-stress test) reactive   3. Braxton Hicks contractions   4. Yeast vaginitis    Discharge home Follow up at [facility] as scheduled Rx Terazol PTL precautions  Allergies as of 10/16/2019   No Known Allergies     Medication List    TAKE these medications   ferrous sulfate 325 (65 FE) MG tablet Commonly known as: FerrouSul Take 1 tablet (325 mg total) by mouth 2 (two) times daily.   PRENATAL VITAMIN PO Take 1 tablet by mouth daily.   terconazole 0.4 % vaginal cream Commonly known as: TERAZOL 7 Place 1 applicator vaginally at bedtime.      Donette Larry, CNM 10/16/2019, 8:43 PM

## 2019-10-16 NOTE — MAU Note (Signed)
Had sex about 6 days and then started having vaginal irritation and itching.  Then today, starting having contractions at 5:45 pm, every 10 min then every 5 min now.  No leaking.  No bleeding. Baby moving well.

## 2019-10-18 LAB — CULTURE, OB URINE: Culture: 20000 — AB

## 2019-10-20 ENCOUNTER — Encounter (HOSPITAL_COMMUNITY): Payer: Self-pay

## 2019-10-20 ENCOUNTER — Ambulatory Visit (HOSPITAL_COMMUNITY): Payer: 59 | Admitting: *Deleted

## 2019-10-20 ENCOUNTER — Other Ambulatory Visit: Payer: Self-pay

## 2019-10-20 ENCOUNTER — Ambulatory Visit (HOSPITAL_COMMUNITY)
Admission: RE | Admit: 2019-10-20 | Discharge: 2019-10-20 | Disposition: A | Discharge status: Home / Self Care | Payer: 59 | Source: Ambulatory Visit | Attending: Obstetrics and Gynecology | Admitting: Obstetrics and Gynecology

## 2019-10-20 VITALS — BP 102/57 | HR 93 | Temp 99.0°F

## 2019-10-20 DIAGNOSIS — Z8759 Personal history of other complications of pregnancy, childbirth and the puerperium: Secondary | ICD-10-CM | POA: Diagnosis not present

## 2019-10-20 DIAGNOSIS — Z148 Genetic carrier of other disease: Secondary | ICD-10-CM | POA: Diagnosis not present

## 2019-10-20 DIAGNOSIS — Z3A31 31 weeks gestation of pregnancy: Secondary | ICD-10-CM

## 2019-10-20 DIAGNOSIS — O09293 Supervision of pregnancy with other poor reproductive or obstetric history, third trimester: Secondary | ICD-10-CM | POA: Insufficient documentation

## 2019-10-20 LAB — GC/CHLAMYDIA PROBE AMP (~~LOC~~) NOT AT ARMC
Chlamydia: NEGATIVE
Comment: NEGATIVE
Comment: NORMAL
Neisseria Gonorrhea: NEGATIVE

## 2019-10-21 ENCOUNTER — Telehealth: Payer: Self-pay | Admitting: Obstetrics and Gynecology

## 2019-10-27 ENCOUNTER — Ambulatory Visit (HOSPITAL_COMMUNITY): Payer: 59 | Admitting: *Deleted

## 2019-10-27 ENCOUNTER — Ambulatory Visit (HOSPITAL_COMMUNITY)
Admission: RE | Admit: 2019-10-27 | Discharge: 2019-10-27 | Disposition: A | Discharge status: Home / Self Care | Payer: 59 | Source: Ambulatory Visit | Attending: Obstetrics and Gynecology | Admitting: Obstetrics and Gynecology

## 2019-10-27 ENCOUNTER — Encounter (HOSPITAL_COMMUNITY): Payer: Self-pay

## 2019-10-27 ENCOUNTER — Other Ambulatory Visit: Payer: Self-pay

## 2019-10-27 VITALS — BP 107/57 | HR 119 | Temp 98.9°F

## 2019-10-27 DIAGNOSIS — Z862 Personal history of diseases of the blood and blood-forming organs and certain disorders involving the immune mechanism: Secondary | ICD-10-CM | POA: Diagnosis not present

## 2019-10-27 DIAGNOSIS — O0990 Supervision of high risk pregnancy, unspecified, unspecified trimester: Secondary | ICD-10-CM

## 2019-10-27 DIAGNOSIS — Z3A32 32 weeks gestation of pregnancy: Secondary | ICD-10-CM

## 2019-10-27 DIAGNOSIS — O09293 Supervision of pregnancy with other poor reproductive or obstetric history, third trimester: Secondary | ICD-10-CM | POA: Diagnosis not present

## 2019-10-27 DIAGNOSIS — Z148 Genetic carrier of other disease: Secondary | ICD-10-CM | POA: Diagnosis not present

## 2019-10-27 DIAGNOSIS — Z8759 Personal history of other complications of pregnancy, childbirth and the puerperium: Secondary | ICD-10-CM

## 2019-10-27 NOTE — Progress Notes (Signed)
Pt reports leaking clear watery fluid 2 days ago. Denies recent intercourse. No leaking today.

## 2019-10-28 ENCOUNTER — Encounter: Payer: Self-pay | Admitting: Obstetrics and Gynecology

## 2019-10-28 ENCOUNTER — Telehealth (INDEPENDENT_AMBULATORY_CARE_PROVIDER_SITE_OTHER): Payer: 59 | Admitting: Obstetrics and Gynecology

## 2019-10-28 DIAGNOSIS — O0993 Supervision of high risk pregnancy, unspecified, third trimester: Secondary | ICD-10-CM

## 2019-10-28 DIAGNOSIS — Z8759 Personal history of other complications of pregnancy, childbirth and the puerperium: Secondary | ICD-10-CM

## 2019-10-28 DIAGNOSIS — D573 Sickle-cell trait: Secondary | ICD-10-CM

## 2019-10-28 DIAGNOSIS — O0990 Supervision of high risk pregnancy, unspecified, unspecified trimester: Secondary | ICD-10-CM

## 2019-10-28 DIAGNOSIS — D563 Thalassemia minor: Secondary | ICD-10-CM

## 2019-10-28 DIAGNOSIS — O09293 Supervision of pregnancy with other poor reproductive or obstetric history, third trimester: Secondary | ICD-10-CM

## 2019-10-28 DIAGNOSIS — Z3A33 33 weeks gestation of pregnancy: Secondary | ICD-10-CM

## 2019-10-28 NOTE — Progress Notes (Signed)
   TELEHEALTH VIRTUAL OBSTETRICS VISIT ENCOUNTER NOTE  I connected with Kari [name] on 10/28/19 at  2:45 PM EST by telephone at home and verified that I am speaking with the correct person using two identifiers.   I discussed the limitations, risks, security and privacy concerns of performing an evaluation and management service by telephone and the availability of in person appointments. I also discussed with the patient that there may be a patient responsible charge related to this service. The patient expressed understanding and agreed to proceed.  Subjective:  Kari [name] is a 23 y.o. [phone] at [redacted]w[redacted]d being followed for ongoing prenatal care.  She is currently monitored for the following issues for this high-risk pregnancy and has History of IUFD; Vitamin D deficiency; Supervision of high risk pregnancy, antepartum; Sickle cell trait ([place]); and Alpha thalassemia silent carrier on their problem list.  Patient reports no complaints. Reports fetal movement. Denies any contractions, bleeding or leaking of fluid.   The following portions of the patient's history were reviewed and updated as appropriate: allergies, current medications, past family history, past medical history, past social history, past surgical history and problem list.   Objective:   General:  Alert, oriented and cooperative.   Mental Status: Normal mood and affect perceived. Normal judgment and thought content.  Rest of physical exam deferred due to type of encounter  Assessment and Plan:  Pregnancy: [id number] at [redacted]w[redacted]d  1. Supervision of high risk pregnancy, antepartum  2. History of IUFD BPP weekly Yesterday, 8/8 Plan for delivery at 39 weeks  3. Sickle cell trait ([place])  4. Alpha thalassemia silent carrier   Preterm labor symptoms and general obstetric precautions including but not limited to vaginal bleeding, contractions, leaking of fluid and fetal movement were reviewed in detail with the  patient.  I discussed the assessment and treatment plan with the patient. The patient was provided an opportunity to ask questions and all were answered. The patient agreed with the plan and demonstrated an understanding of the instructions. The patient was advised to call back or seek an in-person office evaluation/go to MAU at [facility] for any urgent or concerning symptoms. Please refer to After Visit Summary for other counseling recommendations.   I provided 15 minutes of non-face-to-face time during this encounter.  Return in about 2 weeks (around 11/11/2019) for virtual, high OB.  Future Appointments  Date Time Provider [place]  11/03/2019 10:15 AM [place] NURSE [place] [facility]  11/03/2019 10:15 AM [place] 4 [facility]    Sloan Leiter, [place] for [place], [place]

## 2019-11-03 ENCOUNTER — Ambulatory Visit (HOSPITAL_COMMUNITY): Payer: 59 | Admitting: *Deleted

## 2019-11-03 ENCOUNTER — Other Ambulatory Visit (HOSPITAL_COMMUNITY): Payer: Self-pay | Admitting: *Deleted

## 2019-11-03 ENCOUNTER — Encounter (HOSPITAL_COMMUNITY): Payer: Self-pay

## 2019-11-03 ENCOUNTER — Other Ambulatory Visit: Payer: Self-pay

## 2019-11-03 ENCOUNTER — Ambulatory Visit (HOSPITAL_COMMUNITY)
Admission: RE | Admit: 2019-11-03 | Discharge: 2019-11-03 | Disposition: A | Discharge status: Home / Self Care | Payer: 59 | Source: Ambulatory Visit | Attending: Obstetrics and Gynecology | Admitting: Obstetrics and Gynecology

## 2019-11-03 VITALS — BP 103/58 | HR 113 | Temp 98.7°F

## 2019-11-03 DIAGNOSIS — O0990 Supervision of high risk pregnancy, unspecified, unspecified trimester: Secondary | ICD-10-CM

## 2019-11-03 DIAGNOSIS — Z148 Genetic carrier of other disease: Secondary | ICD-10-CM | POA: Diagnosis not present

## 2019-11-03 DIAGNOSIS — O09293 Supervision of pregnancy with other poor reproductive or obstetric history, third trimester: Secondary | ICD-10-CM

## 2019-11-03 DIAGNOSIS — Z362 Encounter for other antenatal screening follow-up: Secondary | ICD-10-CM

## 2019-11-03 DIAGNOSIS — Z8759 Personal history of other complications of pregnancy, childbirth and the puerperium: Secondary | ICD-10-CM | POA: Diagnosis not present

## 2019-11-03 DIAGNOSIS — Z3A33 33 weeks gestation of pregnancy: Secondary | ICD-10-CM

## 2019-11-06 ENCOUNTER — Telehealth: Payer: Self-pay | Admitting: Obstetrics and Gynecology

## 2019-11-06 NOTE — Telephone Encounter (Signed)
Pt called , concerned over slight respiratory sx.  No fever, no SOB. Unlabored conversation. Pt's Sister dx'd + Covid last week.Pt was covid tested yesterday. Pt advised to make clinic aware of her exposure, and to self quarantine x 2 wks. She can be seen by televisit.  Pt to continue kick counts at home, as before.

## 2019-11-11 ENCOUNTER — Encounter (HOSPITAL_COMMUNITY): Payer: Self-pay

## 2019-11-11 ENCOUNTER — Other Ambulatory Visit: Payer: Self-pay

## 2019-11-11 ENCOUNTER — Ambulatory Visit (HOSPITAL_COMMUNITY): Payer: 59 | Admitting: *Deleted

## 2019-11-11 ENCOUNTER — Telehealth: Payer: 59 | Admitting: Obstetrics & Gynecology

## 2019-11-11 ENCOUNTER — Ambulatory Visit (HOSPITAL_COMMUNITY)
Admission: RE | Admit: 2019-11-11 | Discharge: 2019-11-11 | Disposition: A | Discharge status: Home / Self Care | Payer: 59 | Source: Ambulatory Visit | Attending: Obstetrics and Gynecology | Admitting: Obstetrics and Gynecology

## 2019-11-11 ENCOUNTER — Telehealth (INDEPENDENT_AMBULATORY_CARE_PROVIDER_SITE_OTHER): Payer: 59 | Admitting: Obstetrics & Gynecology

## 2019-11-11 VITALS — BP 111/57 | HR 98 | Temp 97.9°F

## 2019-11-11 DIAGNOSIS — O0990 Supervision of high risk pregnancy, unspecified, unspecified trimester: Secondary | ICD-10-CM | POA: Diagnosis present

## 2019-11-11 DIAGNOSIS — Z3A38 38 weeks gestation of pregnancy: Secondary | ICD-10-CM

## 2019-11-11 DIAGNOSIS — Z3A35 35 weeks gestation of pregnancy: Secondary | ICD-10-CM

## 2019-11-11 DIAGNOSIS — Z8759 Personal history of other complications of pregnancy, childbirth and the puerperium: Secondary | ICD-10-CM | POA: Diagnosis present

## 2019-11-11 DIAGNOSIS — O0993 Supervision of high risk pregnancy, unspecified, third trimester: Secondary | ICD-10-CM

## 2019-11-11 DIAGNOSIS — O09293 Supervision of pregnancy with other poor reproductive or obstetric history, third trimester: Secondary | ICD-10-CM

## 2019-11-11 NOTE — Progress Notes (Signed)
S/w patient for mychart visit.  Pt reports fetal movement with irregular contractions occasional white, watery discharge.

## 2019-11-12 ENCOUNTER — Telehealth: Payer: 59 | Admitting: Obstetrics and Gynecology

## 2019-11-17 ENCOUNTER — Other Ambulatory Visit (HOSPITAL_COMMUNITY): Payer: Self-pay | Admitting: *Deleted

## 2019-11-17 ENCOUNTER — Ambulatory Visit (HOSPITAL_COMMUNITY): Payer: 59 | Admitting: *Deleted

## 2019-11-17 ENCOUNTER — Other Ambulatory Visit: Payer: Self-pay

## 2019-11-17 ENCOUNTER — Encounter (HOSPITAL_COMMUNITY): Payer: Self-pay

## 2019-11-17 ENCOUNTER — Ambulatory Visit (HOSPITAL_COMMUNITY)
Admission: RE | Admit: 2019-11-17 | Discharge: 2019-11-17 | Disposition: A | Discharge status: Home / Self Care | Payer: 59 | Source: Ambulatory Visit | Attending: Obstetrics and Gynecology | Admitting: Obstetrics and Gynecology

## 2019-11-17 VITALS — BP 105/58 | HR 92 | Temp 97.5°F

## 2019-11-17 DIAGNOSIS — Z8759 Personal history of other complications of pregnancy, childbirth and the puerperium: Secondary | ICD-10-CM

## 2019-11-17 DIAGNOSIS — O0990 Supervision of high risk pregnancy, unspecified, unspecified trimester: Secondary | ICD-10-CM

## 2019-11-17 DIAGNOSIS — O09293 Supervision of pregnancy with other poor reproductive or obstetric history, third trimester: Secondary | ICD-10-CM

## 2019-11-17 DIAGNOSIS — Z3A35 35 weeks gestation of pregnancy: Secondary | ICD-10-CM

## 2019-11-18 ENCOUNTER — Telehealth (INDEPENDENT_AMBULATORY_CARE_PROVIDER_SITE_OTHER): Payer: 59

## 2019-11-18 VITALS — BP 105/68

## 2019-11-18 DIAGNOSIS — R102 Pelvic and perineal pain: Secondary | ICD-10-CM

## 2019-11-18 DIAGNOSIS — O0993 Supervision of high risk pregnancy, unspecified, third trimester: Secondary | ICD-10-CM

## 2019-11-18 DIAGNOSIS — Z3A36 36 weeks gestation of pregnancy: Secondary | ICD-10-CM

## 2019-11-18 DIAGNOSIS — O26893 Other specified pregnancy related conditions, third trimester: Secondary | ICD-10-CM

## 2019-11-18 DIAGNOSIS — O0990 Supervision of high risk pregnancy, unspecified, unspecified trimester: Secondary | ICD-10-CM

## 2019-11-18 NOTE — Patient Instructions (Signed)
Medroxyprogesterone injection [Contraceptive] What is this medicine? MEDROXYPROGESTERONE (me DROX ee proe JES te rone) contraceptive injections prevent pregnancy. They provide effective birth control for 3 months. Depo-subQ Provera 104 is also used for treating pain related to endometriosis. This medicine may be used for other purposes; ask your health care provider or pharmacist if you have questions. COMMON BRAND NAME(S): Depo-Provera, Depo-subQ Provera 104 What should I tell my health care provider before I take this medicine? They need to know if you have any of these conditions:  frequently drink alcohol  asthma  blood vessel disease or a history of a blood clot in the lungs or legs  bone disease such as osteoporosis  breast cancer  diabetes  eating disorder (anorexia nervosa or bulimia)  high blood pressure  HIV infection or AIDS  kidney disease  liver disease  mental depression  migraine  seizures (convulsions)  stroke  tobacco smoker  vaginal bleeding  an unusual or allergic reaction to medroxyprogesterone, other hormones, medicines, foods, dyes, or preservatives  pregnant or trying to get pregnant  breast-feeding How should I use this medicine? Depo-Provera Contraceptive injection is given into a muscle. Depo-subQ Provera 104 injection is given under the skin. These injections are given by a health care professional. You must not be pregnant before getting an injection. The injection is usually given during the first 5 days after the start of a menstrual period or 6 weeks after delivery of a baby. Talk to your pediatrician regarding the use of this medicine in children. Special care may be needed. These injections have been used in female children who have started having menstrual periods. Overdosage: If you think you have taken too much of this medicine contact a poison control center or emergency room at once. NOTE: This medicine is only for you. Do not  share this medicine with others. What if I miss a dose? Try not to miss a dose. You must get an injection once every 3 months to maintain birth control. If you cannot keep an appointment, call and reschedule it. If you wait longer than 13 weeks between Depo-Provera contraceptive injections or longer than 14 weeks between Depo-subQ Provera 104 injections, you could get pregnant. Use another method for birth control if you miss your appointment. You may also need a pregnancy test before receiving another injection. What may interact with this medicine? Do not take this medicine with any of the following medications:  bosentan This medicine may also interact with the following medications:  aminoglutethimide  antibiotics or medicines for infections, especially rifampin, rifabutin, rifapentine, and griseofulvin  aprepitant  barbiturate medicines such as phenobarbital or primidone  bexarotene  carbamazepine  medicines for seizures like ethotoin, felbamate, oxcarbazepine, phenytoin, topiramate  modafinil  St. John's wort This list may not describe all possible interactions. Give your health care provider a list of all the medicines, herbs, non-prescription drugs, or dietary supplements you use. Also tell them if you smoke, drink alcohol, or use illegal drugs. Some items may interact with your medicine. What should I watch for while using this medicine? This drug does not protect you against HIV infection (AIDS) or other sexually transmitted diseases. Use of this product may cause you to lose calcium from your bones. Loss of calcium may cause weak bones (osteoporosis). Only use this product for more than 2 years if other forms of birth control are not right for you. The longer you use this product for birth control the more likely you will be at risk   for weak bones. Ask your health care professional how you can keep strong bones. You may have a change in bleeding pattern or irregular periods.  Many females stop having periods while taking this drug. If you have received your injections on time, your chance of being pregnant is very low. If you think you may be pregnant, see your health care professional as soon as possible. Tell your health care professional if you want to get pregnant within the next year. The effect of this medicine may last a long time after you get your last injection. What side effects may I notice from receiving this medicine? Side effects that you should report to your doctor or health care professional as soon as possible:  allergic reactions like skin rash, itching or hives, swelling of the face, lips, or tongue  breast tenderness or discharge  breathing problems  changes in vision  depression  feeling faint or lightheaded, falls  fever  pain in the abdomen, chest, groin, or leg  problems with balance, talking, walking  unusually weak or tired  yellowing of the eyes or skin Side effects that usually do not require medical attention (report to your doctor or health care professional if they continue or are bothersome):  acne  fluid retention and swelling  headache  irregular periods, spotting, or absent periods  temporary pain, itching, or skin reaction at site where injected  weight gain This list may not describe all possible side effects. Call your doctor for medical advice about side effects. You may report side effects to FDA at 1-800-FDA-1088. Where should I keep my medicine? This does not apply. The injection will be given to you by a health care professional. NOTE: This sheet is a summary. It may not cover all possible information. If you have questions about this medicine, talk to your doctor, pharmacist, or health care provider.  2020 Elsevier/Gold Standard (2008-12-31 18:37:56)  

## 2019-11-18 NOTE — Progress Notes (Signed)
Pt is on the phone preparing for virtual visit with provider. [redacted]w[redacted]d.

## 2019-11-18 NOTE — Progress Notes (Signed)
TELEHEALTH VIRTUAL OBSTETRICS VISIT ENCOUNTER NOTE  I connected with Kari [name] on 11/18/19 at  3:45 PM EST by telephone at home and verified that I am speaking with the correct person using two identifiers.   I discussed the limitations, risks, security and privacy concerns of performing an evaluation and management service by telephone and the availability of in person appointments. I also discussed with the patient that there may be a patient responsible charge related to this service. The patient expressed understanding and agreed to proceed.  Subjective:  Kari [name] is a 23 y.o. [phone] at [redacted]w[redacted]d being followed for ongoing prenatal care.  She is currently monitored for the following issues for this high-risk pregnancy and has History of IUFD; Vitamin D deficiency; Supervision of high risk pregnancy, antepartum; Sickle cell trait (HCC); and Alpha thalassemia silent carrier on their problem list.  Patient reports pelvic pain. She reports the pelvic pain is constant but worsened with sitting down.  Patient states she wears a belly support with some relief. Reports fetal movement. She reports contractions last night that was strong, every 5 minutes, and last about an hour.  However, she states she laid down and the contractions subsided.  She also reports some discharge that was heavy for about a day, but had no smell or abnormal color and has since decreased in quantity.     The following portions of the patient's history were reviewed and updated as appropriate: allergies, current medications, past family history, past medical history, past social history, past surgical history and problem list.   Objective:   General:  Alert, oriented and cooperative.   Mental Status: Normal mood and affect perceived. Normal judgment and thought content.  Rest of physical exam deferred due to type of encounter  Assessment and Plan:  Pregnancy: [id number] at [redacted]w[redacted]d  1. Supervision of high  risk pregnancy, antepartum -Anticipatory guidance for upcoming appts. -Informed that next visit will be in person with completion of GBS. -Further instructed to inform providers of not having GBS if she happens to go to the hospital over the weekend.  -Educated on GBS bacteria including what it is, why we test, and how and when we treat if needed. -Discussed and reviewed postpartum planning including contraception, pediatricians, and infant feedings and circumcision desires/costs. -Patient expresses desire for Depo prior to discharge from hospital.  2. Pelvic pain during pregnancy in third trimester, antepartum -Reviewed comfort measures for pelvic pain including tylenol usage and position changes. -Discussed how sitting adds increased pressure on pelvis therefore increasing perception of pain. -Reviewed usage of tylenol pm at bedtime for increased comfort and sleep.   Preterm labor symptoms and general obstetric precautions including but not limited to vaginal bleeding, contractions, leaking of fluid and fetal movement were reviewed in detail with the patient.   I discussed the assessment and treatment plan with the patient. The patient was provided an opportunity to ask questions and all were answered. The patient agreed with the plan and demonstrated an understanding of the instructions. The patient was advised to call back or seek an in-person office evaluation/go to MAU at [facility] for any urgent or concerning symptoms. Please refer to After Visit Summary for other counseling recommendations.   I provided 8 minutes of non-face-to-face time during this encounter.  Return in about 4 weeks (around 12/16/2019).  Future Appointments  Date Time Provider Department Center  11/18/2019  3:45 PM Gerrit Heck, CNM [facility] None  11/24/2019 10:45 AM [facility] NST  [place] [facility]-US  11/24/2019 10:50 AM [facility] NURSE [place] [facility]-US  12/01/2019 11:00 AM [facility] NURSE [facility]FC-US  12/01/2019  11:00 AM [facility] [place] 3 [facility] MFC-US  12/08/2019 11:15 AM [facility] [place] 4 [facility] MFC-US  12/08/2019 11:20 AM [place] [facility] [employer], [place]

## 2019-11-24 ENCOUNTER — Ambulatory Visit (INDEPENDENT_AMBULATORY_CARE_PROVIDER_SITE_OTHER): Payer: 59 | Admitting: Obstetrics & Gynecology

## 2019-11-24 ENCOUNTER — Ambulatory Visit (HOSPITAL_COMMUNITY): Payer: 59 | Attending: Obstetrics | Admitting: *Deleted

## 2019-11-24 ENCOUNTER — Other Ambulatory Visit: Payer: Self-pay

## 2019-11-24 ENCOUNTER — Encounter (HOSPITAL_COMMUNITY): Payer: Self-pay

## 2019-11-24 ENCOUNTER — Ambulatory Visit (HOSPITAL_COMMUNITY): Payer: 59 | Admitting: *Deleted

## 2019-11-24 VITALS — BP 104/54 | HR 101 | Temp 97.4°F

## 2019-11-24 DIAGNOSIS — O0990 Supervision of high risk pregnancy, unspecified, unspecified trimester: Secondary | ICD-10-CM

## 2019-11-24 DIAGNOSIS — O0993 Supervision of high risk pregnancy, unspecified, third trimester: Secondary | ICD-10-CM

## 2019-11-24 DIAGNOSIS — Z3A36 36 weeks gestation of pregnancy: Secondary | ICD-10-CM

## 2019-11-24 DIAGNOSIS — Z8759 Personal history of other complications of pregnancy, childbirth and the puerperium: Secondary | ICD-10-CM | POA: Diagnosis not present

## 2019-11-24 DIAGNOSIS — Z113 Encounter for screening for infections with a predominantly sexual mode of transmission: Secondary | ICD-10-CM | POA: Diagnosis not present

## 2019-11-24 NOTE — Progress Notes (Signed)
    PRENATAL VISIT NOTE  Subjective:  Kari [name] is a 23 y.o. [phone] at [redacted]w[redacted]d being seen today for ongoing prenatal care.  She is currently monitored for the following issues for this high-risk pregnancy and has History of IUFD; Vitamin D deficiency; Supervision of high risk pregnancy, antepartum; Sickle cell trait ([place]); and Alpha thalassemia silent carrier on their problem list.  Patient reports occasional contractions.  Contractions: Irregular. Vag. Bleeding: None.  Movement: Present. Denies leaking of fluid.   The following portions of the patient's history were reviewed and updated as appropriate: allergies, current medications, past family history, past medical history, past social history, past surgical history and problem list.   Objective:   Vitals:   11/24/19 1341  BP: 100/60  Pulse: (!) 108  Weight: 135 lb 6.4 oz (61.4 kg)    Fetal Status: Fetal Heart Rate (bpm): 148   Movement: Present  Presentation: Vertex  General:  Alert, oriented and cooperative. Patient is in no acute distress.  Skin: Skin is warm and dry. No rash noted.   Cardiovascular: Normal heart rate noted  Respiratory: Normal respiratory effort, no problems with respiration noted  Abdomen: Soft, gravid, appropriate for gestational age.  Pain/Pressure: Absent     Pelvic: Cervical exam performed Dilation: 1.5 Effacement (%): 30 Station: Ballotable  Extremities: Normal range of motion.  Edema: Trace  Mental Status: Normal mood and affect. Normal behavior. Normal judgment and thought content.   Assessment and Plan:  Pregnancy: [id number] at [redacted]w[redacted]d 1. Supervision of high risk pregnancy, antepartum Good fetal surveillance Patient Active Problem List   Diagnosis Date Noted  . Alpha thalassemia silent carrier 06/08/2019  . Sickle cell trait ([place]) 05/25/2019  . Supervision of high risk pregnancy, antepartum 04/27/2019  . Vitamin D deficiency 01/22/2018  . History of IUFD 10/07/2015     Term labor  symptoms and general obstetric precautions including but not limited to vaginal bleeding, contractions, leaking of fluid and fetal movement were reviewed in detail with the patient. Please refer to After Visit Summary for other counseling recommendations.   Return in about 1 week (around 12/01/2019).  Future Appointments  Date Time Provider [place]  12/01/2019 11:00 AM Sophia [place] [facility]-US  12/01/2019 11:00 AM [place] 3 [facility] M[facility]-US  12/08/2019 11:15 AM [facility] [place] 4 [facility] MFC-US  12/08/2019 11:20 AM [facility] NURSE [facility] MFC-US    Emeterio Reeve, MD

## 2019-11-24 NOTE — Patient Instructions (Signed)

## 2019-11-24 NOTE — Procedures (Signed)
Kari [name] Sep 19, 1996 [redacted]w[redacted]d  Fetus A Non-Stress Test Interpretation for 11/24/19  Indication: Hx IUFD @30  wks  Fetal Heart Rate A Mode: External Baseline Rate (A): 140 bpm Variability: Moderate Accelerations: 15 x 15 Decelerations: None Multiple birth?: No  Uterine Activity Mode: Palpation, Toco Contraction Frequency (min): Erratic UI Contraction Quality: Mild Resting Tone Palpated: Relaxed Resting Time: Adequate  Interpretation (Fetal Testing) Nonstress Test Interpretation: Reactive Comments: EFM tracing reviewed by Dr. Annamaria Boots

## 2019-11-24 NOTE — Progress Notes (Signed)
Patient reports fetal movement with irregular contractions. 

## 2019-11-26 LAB — CERVICOVAGINAL ANCILLARY ONLY
Chlamydia: NEGATIVE
Comment: NEGATIVE
Comment: NORMAL
Neisseria Gonorrhea: NEGATIVE

## 2019-11-26 LAB — STREP GP B NAA: Strep Gp B NAA: NEGATIVE

## 2019-11-28 ENCOUNTER — Other Ambulatory Visit: Payer: Self-pay

## 2019-11-28 ENCOUNTER — Inpatient Hospital Stay (HOSPITAL_COMMUNITY)
Admission: AD | Admit: 2019-11-28 | Discharge: 2019-11-28 | Disposition: A | Discharge status: Home / Self Care | Payer: 59 | Source: Ambulatory Visit | Attending: Obstetrics and Gynecology | Admitting: Obstetrics and Gynecology

## 2019-11-28 ENCOUNTER — Encounter (HOSPITAL_COMMUNITY): Payer: Self-pay | Admitting: *Deleted

## 2019-11-28 DIAGNOSIS — O99891 Other specified diseases and conditions complicating pregnancy: Secondary | ICD-10-CM | POA: Diagnosis not present

## 2019-11-28 DIAGNOSIS — W19XXXA Unspecified fall, initial encounter: Secondary | ICD-10-CM

## 2019-11-28 DIAGNOSIS — Z87891 Personal history of nicotine dependence: Secondary | ICD-10-CM | POA: Insufficient documentation

## 2019-11-28 DIAGNOSIS — R12 Heartburn: Secondary | ICD-10-CM | POA: Insufficient documentation

## 2019-11-28 DIAGNOSIS — O9A213 Injury, poisoning and certain other consequences of external causes complicating pregnancy, third trimester: Secondary | ICD-10-CM | POA: Diagnosis not present

## 2019-11-28 DIAGNOSIS — O471 False labor at or after 37 completed weeks of gestation: Secondary | ICD-10-CM | POA: Insufficient documentation

## 2019-11-28 DIAGNOSIS — Z3A37 37 weeks gestation of pregnancy: Secondary | ICD-10-CM

## 2019-11-28 DIAGNOSIS — Z3689 Encounter for other specified antenatal screening: Secondary | ICD-10-CM

## 2019-11-28 MED ORDER — CALCIUM CARBONATE ANTACID 500 MG PO CHEW
400.0000 mg | CHEWABLE_TABLET | Freq: Once | ORAL | Status: AC
  Administered 2019-11-28: 400 mg via ORAL
  Filled 2019-11-28: qty 2

## 2019-11-28 NOTE — MAU Note (Signed)
Pt reports she fell outside in the rain around 8:45pm. Was trying to get both her sons inside. Pt reports she landed on her left side. Started having contractions around 9pm and are now every 4 minutes apart. Reports fetal movement. Pt denies vaginal bleeding or LOF.

## 2019-11-28 NOTE — MAU Provider Note (Signed)
History     CSN: [id number]  Arrival date and time: 11/28/19 1308   First Provider Initiated Contact with Patient 11/28/19 0111      Chief Complaint  Patient presents with  . Fall  . Contractions   Kari [name] is a 23 y.o. [phone] at [redacted]w[redacted]d who receives care at [facility].  She presents today for Fall and Contractions.  She states she fell on her left side while trying to help her children. She reports she fell onto cement and denies hitting her abdomen.  She states shortly after she started to experience contractions. She states they are slower now, but she states that "it is tight in between...like it doesn't let go all the way."  Patient reports that the contractions were initially 3-4 minutes apart, but are now 10 minutes.  She endorses fetal movement and denies vaginal bleeding or discharge.      OB History    Gravida  5   Para  3   Term  2   Preterm  1   AB  1   Living  2     SAB  1   TAB      Ectopic      Multiple  0   Live Births  2           Past Medical History:  Diagnosis Date  . Anemia   . Anxiety   . Depression   . Headache    migrains  . IUFD (intrauterine fetal death) 04/17/2015    Past Surgical History:  Procedure Laterality Date  . APPENDECTOMY    . HERNIA REPAIR      Family History  Problem Relation Age of Onset  . Alcohol abuse Neg Hx   . Arthritis Neg Hx   . Asthma Neg Hx   . Birth defects Neg Hx   . Cancer Neg Hx   . COPD Neg Hx   . Depression Neg Hx   . Diabetes Neg Hx   . Drug abuse Neg Hx   . Early death Neg Hx   . Hearing loss Neg Hx   . Heart disease Neg Hx   . Hyperlipidemia Neg Hx   . Hypertension Neg Hx   . Kidney disease Neg Hx   . Learning disabilities Neg Hx   . Mental illness Neg Hx   . Mental retardation Neg Hx   . Miscarriages / Stillbirths Neg Hx   . Stroke Neg Hx   . Vision loss Neg Hx   . Varicose Veins Neg Hx     Social History   Tobacco Use  . Smoking status: Former Research scientist (life sciences)  .  Smokeless tobacco: Never Used  . Tobacco comment: prior to +UPT  Substance Use Topics  . Alcohol use: No  . Drug use: No    Allergies: No Known Allergies  Medications Prior to Admission  Medication Sig Dispense Refill Last Dose  . ferrous sulfate (FERROUSUL) 325 (65 FE) MG tablet Take 1 tablet (325 mg total) by mouth 2 (two) times daily. 60 tablet 1 11/27/2019 at Unknown time  . Prenatal Vit-Fe Fumarate-FA (PRENATAL VITAMIN PO) Take 1 tablet by mouth daily.    11/27/2019 at Unknown time  . terconazole (TERAZOL 7) 0.4 % vaginal cream Place 1 applicator vaginally at bedtime. (Patient not taking: Reported on 11/03/2019) 45 g 0     Review of Systems  Constitutional: Negative for chills and fever.  Gastrointestinal: Positive for abdominal pain (Soreness). Negative for nausea and  vomiting.  Genitourinary: Negative for difficulty urinating, dysuria, vaginal bleeding and vaginal discharge.  Neurological: Negative for dizziness, light-headedness and headaches.   Physical Exam   Blood pressure 106/62, pulse (!) 109, temperature 98.2 F (36.8 C), temperature source Oral, resp. rate 18, height 5\' 2"  (1.575 m), weight 61.1 kg, last menstrual period 02/04/2019, SpO2 99 %, currently breastfeeding.  Physical Exam  Constitutional: She is oriented to person, place, and time. She appears well-developed and well-nourished. No distress.  HENT:  Head: Normocephalic and atraumatic.  Eyes: Conjunctivae are normal.  Neck: Normal range of motion.  Cardiovascular: Normal rate.  Respiratory: Effort normal.  GI: Soft. Bowel sounds are normal. There is abdominal tenderness in the left upper quadrant and left lower quadrant. There is no guarding.  Tenderness on left flank.  Genitourinary:    Genitourinary Comments: Dilation: 3 Effacement (%): 70 Cervical Position: Posterior Station: -3     Musculoskeletal: Normal range of motion.  Neurological: She is alert and oriented to person, place, and time.   Skin: Skin is warm and dry. No bruising noted.    Fetal Assessment 145 bpm, Mod Var, -Decels, +Accels Toco: Irregular with Irritability Noted  MAU Course  No results found for this or any previous visit (from the past 24 hour(s)). No results found.  MDM PE Labs:None EFM  Assessment and Plan  23 year old 21  SIUP at 37.3weeks Cat I FT S/P Fall   -Exam findings discussed. -Next appt Tuesday in office. -Offered and declines pain medication. -Reviewed extended monitoring for fetal well-being.  -Discussed cervical exam findings. -Patient without questions or concerns. -Will reassess in 2 hours. -NST Reactive  Friday MSN, CNM 11/28/2019, 1:11 AM   Reassessment (3:48 AM)  -No cervical change. -Patient reports contractions are 15-20 minutes apart, but are stronger when they occur. -Patient continues to decline pain medication, but states she is feeling some increased tenderness. -Patient reports some heartburn with increased contraction strength. -Offered and accepts tums. -Patient states she does not feel comfortable going home yet. -Discussed continued monitoring and recheck at 0515. -Patient agreeable and without questions. -Nurse updated on POC.   Reassessment (5:27 AM)  -No cervical change. -NST remains reactive. -Per nurse patient reports improvement in contractions. -Will discharge to home. -Follow up as scheduled.  -Labor Precautions.  14/04/2019 MSN, CNM Advanced Practice Provider, [facility]

## 2019-11-30 ENCOUNTER — Encounter (HOSPITAL_COMMUNITY): Payer: Self-pay | Admitting: *Deleted

## 2019-11-30 ENCOUNTER — Inpatient Hospital Stay (HOSPITAL_BASED_OUTPATIENT_CLINIC_OR_DEPARTMENT_OTHER): Payer: 59

## 2019-11-30 ENCOUNTER — Inpatient Hospital Stay (HOSPITAL_COMMUNITY)
Admission: AD | Admit: 2019-11-30 | Discharge: 2019-11-30 | Disposition: A | Discharge status: Home / Self Care | Payer: 59 | Source: Home / Self Care | Attending: Obstetrics and Gynecology | Admitting: Obstetrics and Gynecology

## 2019-11-30 ENCOUNTER — Telehealth: Payer: Self-pay

## 2019-11-30 ENCOUNTER — Other Ambulatory Visit: Payer: Self-pay

## 2019-11-30 DIAGNOSIS — O368130 Decreased fetal movements, third trimester, not applicable or unspecified: Secondary | ICD-10-CM | POA: Insufficient documentation

## 2019-11-30 DIAGNOSIS — Z3A37 37 weeks gestation of pregnancy: Secondary | ICD-10-CM | POA: Insufficient documentation

## 2019-11-30 DIAGNOSIS — O368190 Decreased fetal movements, unspecified trimester, not applicable or unspecified: Secondary | ICD-10-CM

## 2019-11-30 DIAGNOSIS — Z87891 Personal history of nicotine dependence: Secondary | ICD-10-CM | POA: Insufficient documentation

## 2019-11-30 DIAGNOSIS — O09293 Supervision of pregnancy with other poor reproductive or obstetric history, third trimester: Secondary | ICD-10-CM | POA: Diagnosis not present

## 2019-11-30 DIAGNOSIS — Z3689 Encounter for other specified antenatal screening: Secondary | ICD-10-CM | POA: Insufficient documentation

## 2019-11-30 NOTE — Discharge Instructions (Signed)
Fetal Movement Counts Patient Name: ________________________________________________ Patient Due Date: ____________________ What is a fetal movement count?  A fetal movement count is the number of times that you feel your baby move during a certain amount of time. This may also be called a fetal kick count. A fetal movement count is recommended for every pregnant woman. You may be asked to start counting fetal movements as early as week 28 of your pregnancy. Pay attention to when your baby is most active. You may notice your baby's sleep and wake cycles. You may also notice things that make your baby move more. You should do a fetal movement count:  When your baby is normally most active.  At the same time each day. A good time to count movements is while you are resting, after having something to eat and drink. How do I count fetal movements? 1. Find a quiet, comfortable area. Sit, or lie down on your side. 2. Write down the date, the start time and stop time, and the number of movements that you felt between those two times. Take this information with you to your health care visits. 3. For 2 hours, count kicks, flutters, swishes, rolls, and jabs. You should feel at least 10 movements during 2 hours. 4. You may stop counting after you have felt 10 movements. 5. If you do not feel 10 movements in 2 hours, have something to eat and drink. Then, keep resting and counting for 1 hour. If you feel at least 4 movements during that hour, you may stop counting. Contact a health care provider if:  You feel fewer than 4 movements in 2 hours.  Your baby is not moving like he or she usually does. Date: ____________ Start time: ____________ Stop time: ____________ Movements: ____________ Date: ____________ Start time: ____________ Stop time: ____________ Movements: ____________ Date: ____________ Start time: ____________ Stop time: ____________ Movements: ____________ Date: ____________ Start time:  ____________ Stop time: ____________ Movements: ____________ Date: ____________ Start time: ____________ Stop time: ____________ Movements: ____________ Date: ____________ Start time: ____________ Stop time: ____________ Movements: ____________ Date: ____________ Start time: ____________ Stop time: ____________ Movements: ____________ Date: ____________ Start time: ____________ Stop time: ____________ Movements: ____________ Date: ____________ Start time: ____________ Stop time: ____________ Movements: ____________ This information is not intended to replace advice given to you by your health care provider. Make sure you discuss any questions you have with your health care provider. Document Released: 01/09/2007 Document Revised: 12/30/2018 Document Reviewed: 01/19/2016 Elsevier Patient Education  2020 [employer] Braxton Hicks Contractions Contractions of the uterus can occur throughout pregnancy, but they are not always a sign that you are in labor. You may have practice contractions called Braxton Hicks contractions. These false labor contractions are sometimes confused with true labor. What are Braxton Hicks contractions? Braxton Hicks contractions are tightening movements that occur in the muscles of the uterus before labor. Unlike true labor contractions, these contractions do not result in opening (dilation) and thinning of the cervix. Toward the end of pregnancy (32-34 weeks), Braxton Hicks contractions can happen more often and may become stronger. These contractions are sometimes difficult to tell apart from true labor because they can be very uncomfortable. You should not feel embarrassed if you go to the hospital with false labor. Sometimes, the only way to tell if you are in true labor is for your health care provider to look for changes in the cervix. The health care provider will do a physical exam and may monitor your contractions. If you   are not in true labor, the exam should show  that your cervix is not dilating and your water has not broken. If there are no other health problems associated with your pregnancy, it is completely safe for you to be sent home with false labor. You may continue to have Braxton Hicks contractions until you go into true labor. How to tell the difference between true labor and false labor True labor  Contractions last 30-70 seconds.  Contractions become very regular.  Discomfort is usually felt in the top of the uterus, and it spreads to the lower abdomen and low back.  Contractions do not go away with walking.  Contractions usually become more intense and increase in frequency.  The cervix dilates and gets thinner. False labor  Contractions are usually shorter and not as strong as true labor contractions.  Contractions are usually irregular.  Contractions are often felt in the front of the lower abdomen and in the groin.  Contractions may go away when you walk around or change positions while lying down.  Contractions get weaker and are shorter-lasting as time goes on.  The cervix usually does not dilate or become thin. Follow these instructions at home:   Take over-the-counter and prescription medicines only as told by your health care provider.  Keep up with your usual exercises and follow other instructions from your health care provider.  Eat and drink lightly if you think you are going into labor.  If Braxton Hicks contractions are making you uncomfortable: ? Change your position from lying down or resting to walking, or change from walking to resting. ? Sit and rest in a tub of warm water. ? Drink enough fluid to keep your urine pale yellow. Dehydration may cause these contractions. ? Do slow and deep breathing several times an hour.  Keep all follow-up prenatal visits as told by your health care provider. This is important. Contact a health care provider if:  You have a fever.  You have continuous pain in  your abdomen. Get help right away if:  Your contractions become stronger, more regular, and closer together.  You have fluid leaking or gushing from your vagina.  You pass blood-tinged mucus (bloody show).  You have bleeding from your vagina.  You have low back pain that you never had before.  You feel your baby's head pushing down and causing pelvic pressure.  Your baby is not moving inside you as much as it used to. Summary  Contractions that occur before labor are called Braxton Hicks contractions, false labor, or practice contractions.  Braxton Hicks contractions are usually shorter, weaker, farther apart, and less regular than true labor contractions. True labor contractions usually become progressively stronger and regular, and they become more frequent.  Manage discomfort from Braxton Hicks contractions by changing position, resting in a warm bath, drinking plenty of water, or practicing deep breathing. This information is not intended to replace advice given to you by your health care provider. Make sure you discuss any questions you have with your health care provider. Document Released: 04/25/2017 Document Revised: 11/22/2017 Document Reviewed: 04/25/2017 Elsevier Patient Education  2020 Else[employer] Inc.  

## 2019-11-30 NOTE — Telephone Encounter (Signed)
Pt called and states that she has been having contractions every 10 minutes that are not super painful. Pt reports that she is not feeling the baby move as much as he usually does. Given patients history of prior IUFD, I advised pt to go to the hospital to be evaluated. Pt verbalizes understanding and says she will go.

## 2019-11-30 NOTE — MAU Note (Addendum)
PT had fall on Friday and was seen at MAU. She had +FM and reassuring NST. Over the weekend, had cntrx and d/c FM. Says she's contracting  20 mins apart for 2 days. Does not feel fetal movement as much as usual. 1 movement per hour after drinking cold Dr Malachi Bonds.  Denies LOF or VB.  Lost mucous plug. HX 30 week IUFD.

## 2019-11-30 NOTE — MAU Note (Signed)
Pt back from U/S and she stated she id feeling a lot of fetal movement now. 8/8 B/PP.

## 2019-11-30 NOTE — MAU Provider Note (Signed)
History     CSN: [id number]  Arrival date and time: 11/30/19 1735   First Provider Initiated Contact with Patient 11/30/19 1851      Chief Complaint  Patient presents with  . Decreased Fetal Movement   23 y.o. P1P2162 @37 .5 wks presenting with decreased FM. Reports feeling less FM x2 days. She fell 3 days ago landing on her left side, no abdominal contact. She is eating and drinking well. Denies VB and LOF. Having ctx q20 min. Hx of 30 wk IUFD.  OB History    Gravida  5   Para  3   Term  2   Preterm  1   AB  1   Living  2     SAB  1   TAB      Ectopic      Multiple  0   Live Births  2           Past Medical History:  Diagnosis Date  . Anemia   . Anxiety   . Depression   . Headache    migrains  . IUFD (intrauterine fetal death) 2015-04-19    Past Surgical History:  Procedure Laterality Date  . APPENDECTOMY    . HERNIA REPAIR      Family History  Problem Relation Age of Onset  . Alcohol abuse Neg Hx   . Arthritis Neg Hx   . Asthma Neg Hx   . Birth defects Neg Hx   . Cancer Neg Hx   . COPD Neg Hx   . Depression Neg Hx   . Diabetes Neg Hx   . Drug abuse Neg Hx   . Early death Neg Hx   . Hearing loss Neg Hx   . Heart disease Neg Hx   . Hyperlipidemia Neg Hx   . Hypertension Neg Hx   . Kidney disease Neg Hx   . Learning disabilities Neg Hx   . Mental illness Neg Hx   . Mental retardation Neg Hx   . Miscarriages / Stillbirths Neg Hx   . Stroke Neg Hx   . Vision loss Neg Hx   . Varicose Veins Neg Hx     Social History   Tobacco Use  . Smoking status: Former 2017  . Smokeless tobacco: Never Used  . Tobacco comment: prior to +UPT  Substance Use Topics  . Alcohol use: No  . Drug use: No    Allergies: No Known Allergies  Medications Prior to Admission  Medication Sig Dispense Refill Last Dose  . ferrous sulfate (FERROUSUL) 325 (65 FE) MG tablet Take 1 tablet (325 mg total) by mouth 2 (two) times daily. 60 tablet 1 11/30/2019 at  Unknown time  . Prenatal Vit-Fe Fumarate-FA (PRENATAL VITAMIN PO) Take 1 tablet by mouth daily.    11/30/2019 at Unknown time    Review of Systems  Gastrointestinal: Negative for abdominal pain.  Genitourinary: Negative for vaginal bleeding and vaginal discharge.   Physical Exam   Blood pressure 111/60, pulse (!) 106, temperature 98.1 F (36.7 C), temperature source Oral, resp. rate 18, height 5\' 2"  (1.575 m), weight 60.6 kg, last menstrual period 02/04/2019, SpO2 100 %, currently breastfeeding.  Physical Exam  Nursing note and vitals reviewed. Constitutional: She is oriented to person, place, and time. She appears well-developed and well-nourished. No distress.  HENT:  Head: Normocephalic and atraumatic.  Neck: Normal range of motion.  Cardiovascular: Normal rate.  Respiratory: Effort normal. No respiratory distress.  GI: Soft. She exhibits  no distension. There is no abdominal tenderness.  gravid  Genitourinary:    Genitourinary Comments: VE: 3/70/-3, ballotable   Musculoskeletal: Normal range of motion.  Neurological: She is alert and oriented to person, place, and time.  Skin: Skin is warm and dry.  Psychiatric: She has a normal mood and affect.  EFM: 140 bpm, mod variability, + accels, no decels Toco: rare  No results found for this or any previous visit (from the past 24 hour(s)).  MAU Course  Procedures  MDM NST reactive. Pt feeling some FM but not a lot, will order BPP. BPP 8/8 >10/10, AFI 15 cm. Pt felt good FM while in [place]. Pt reassured. Stable for discharge home.   Assessment and Plan   1. [redacted] weeks gestation of pregnancy   2. Decreased fetal movement   3. NST (non-stress test) reactive   4. Decreased fetal movements in third trimester, single or unspecified fetus    Discharge home Follow up at [facility] tomorrow as scheduled [facility] Labor precautions  Allergies as of 11/30/2019   No Known Allergies     Medication List    TAKE these medications   ferrous  sulfate 325 (65 FE) MG tablet Commonly known as: FerrouSul Take 1 tablet (325 mg total) by mouth 2 (two) times daily.   PRENATAL VITAMIN PO Take 1 tablet by mouth daily.       Julianne Handler, CNM 11/30/2019, 8:47 PM

## 2019-12-01 ENCOUNTER — Ambulatory Visit (HOSPITAL_COMMUNITY): Payer: 59

## 2019-12-01 ENCOUNTER — Ambulatory Visit (INDEPENDENT_AMBULATORY_CARE_PROVIDER_SITE_OTHER): Payer: 59 | Admitting: Family Medicine

## 2019-12-01 ENCOUNTER — Other Ambulatory Visit: Payer: Self-pay

## 2019-12-01 ENCOUNTER — Encounter: Payer: 59 | Admitting: Family Medicine

## 2019-12-01 ENCOUNTER — Ambulatory Visit (HOSPITAL_COMMUNITY): Admission: RE | Admit: 2019-12-01 | Payer: 59 | Source: Ambulatory Visit

## 2019-12-01 VITALS — BP 99/66 | HR 106 | Wt 134.0 lb

## 2019-12-01 DIAGNOSIS — O0990 Supervision of high risk pregnancy, unspecified, unspecified trimester: Secondary | ICD-10-CM

## 2019-12-01 DIAGNOSIS — Z3A37 37 weeks gestation of pregnancy: Secondary | ICD-10-CM

## 2019-12-01 DIAGNOSIS — Z8759 Personal history of other complications of pregnancy, childbirth and the puerperium: Secondary | ICD-10-CM

## 2019-12-01 DIAGNOSIS — D563 Thalassemia minor: Secondary | ICD-10-CM

## 2019-12-01 DIAGNOSIS — D573 Sickle-cell trait: Secondary | ICD-10-CM

## 2019-12-01 NOTE — Progress Notes (Signed)
ROB/NST.  C/o pressure.

## 2019-12-01 NOTE — Progress Notes (Signed)
   PRENATAL VISIT NOTE  Subjective:  Kari [name] is a 23 y.o. [phone] at [redacted]w[redacted]d being seen today for ongoing prenatal care.  She is currently monitored for the following issues for this high-risk pregnancy and has History of IUFD; Vitamin D deficiency; Supervision of high risk pregnancy, antepartum; Sickle cell trait ([place]); and Alpha thalassemia silent carrier on their problem list.  Patient reports no complaints.  Contractions: Irritability. Vag. Bleeding: None.  Movement: Present. Denies leaking of fluid.   The following portions of the patient's history were reviewed and updated as appropriate: allergies, current medications, past family history, past medical history, past social history, past surgical history and problem list.   Objective:   Vitals:   12/01/19 1432  BP: 99/66  Pulse: (!) 106  Weight: 134 lb (60.8 kg)    Fetal Status: Fetal Heart Rate (bpm): NST Fundal Height: 37 cm Movement: Present  Presentation: Vertex  General:  Alert, oriented and cooperative. Patient is in no acute distress.  Skin: Skin is warm and dry. No rash noted.   Cardiovascular: Normal heart rate noted  Respiratory: Normal respiratory effort, no problems with respiration noted  Abdomen: Soft, gravid, appropriate for gestational age.  Pain/Pressure: Present     Pelvic: Cervical exam performed Dilation: 2.5 Effacement (%): 50 Station: -3  Extremities: Normal range of motion.  Edema: Trace  Mental Status: Normal mood and affect. Normal behavior. Normal judgment and thought content.   Assessment and Plan:  Pregnancy: [id number] at [redacted]w[redacted]d 1. Supervision of high risk pregnancy, antepartum FHT and FH normal  2. Sickle cell trait ([place])   3. History of IUFD NST reactive Induction scheduled for 39 weeks.  4. Alpha thalassemia silent carrier   Term labor symptoms and general obstetric precautions including but not limited to vaginal bleeding, contractions, leaking of fluid and fetal movement were  reviewed in detail with the patient. Please refer to After Visit Summary for other counseling recommendations.   No follow-ups on file.  Future Appointments  Date Time Provider [place]  12/08/2019 11:15 AM [place] 4 WH-MFCUS MFC-US  12/08/2019 11:20 AM Laurie, DO

## 2019-12-02 ENCOUNTER — Encounter (HOSPITAL_COMMUNITY): Payer: Self-pay | Admitting: *Deleted

## 2019-12-02 ENCOUNTER — Telehealth (HOSPITAL_COMMUNITY): Payer: Self-pay | Admitting: *Deleted

## 2019-12-02 ENCOUNTER — Other Ambulatory Visit: Payer: Self-pay | Admitting: Advanced Practice Midwife

## 2019-12-02 NOTE — Telephone Encounter (Signed)
Preadmission screen  

## 2019-12-03 ENCOUNTER — Inpatient Hospital Stay (HOSPITAL_COMMUNITY)
Admission: AD | Admit: 2019-12-03 | Discharge: 2019-12-06 | DRG: 807 | Disposition: A | Discharge status: Home / Self Care | Payer: 59 | Attending: Obstetrics & Gynecology | Admitting: Obstetrics & Gynecology

## 2019-12-03 ENCOUNTER — Encounter (HOSPITAL_COMMUNITY): Payer: Self-pay | Admitting: Family Medicine

## 2019-12-03 ENCOUNTER — Other Ambulatory Visit: Payer: Self-pay

## 2019-12-03 DIAGNOSIS — O26893 Other specified pregnancy related conditions, third trimester: Principal | ICD-10-CM | POA: Diagnosis present

## 2019-12-03 DIAGNOSIS — Z20828 Contact with and (suspected) exposure to other viral communicable diseases: Secondary | ICD-10-CM | POA: Diagnosis present

## 2019-12-03 DIAGNOSIS — Z9189 Other specified personal risk factors, not elsewhere classified: Secondary | ICD-10-CM

## 2019-12-03 DIAGNOSIS — O9902 Anemia complicating childbirth: Secondary | ICD-10-CM | POA: Diagnosis present

## 2019-12-03 DIAGNOSIS — Z3490 Encounter for supervision of normal pregnancy, unspecified, unspecified trimester: Secondary | ICD-10-CM | POA: Diagnosis present

## 2019-12-03 DIAGNOSIS — Z3A38 38 weeks gestation of pregnancy: Secondary | ICD-10-CM | POA: Diagnosis not present

## 2019-12-03 DIAGNOSIS — D573 Sickle-cell trait: Secondary | ICD-10-CM | POA: Diagnosis present

## 2019-12-03 DIAGNOSIS — Z87891 Personal history of nicotine dependence: Secondary | ICD-10-CM

## 2019-12-03 LAB — CBC
HCT: 29.5 % — ABNORMAL LOW (ref 36.0–46.0)
Hemoglobin: 9.8 g/dL — ABNORMAL LOW (ref 12.0–15.0)
MCH: 24.3 pg — ABNORMAL LOW (ref 26.0–34.0)
MCHC: 33.2 g/dL (ref 30.0–36.0)
MCV: 73.2 fL — ABNORMAL LOW (ref 80.0–100.0)
Platelets: 144 10*3/uL — ABNORMAL LOW (ref 150–400)
RBC: 4.03 MIL/uL (ref 3.87–5.11)
RDW: 15.3 % (ref 11.5–15.5)
WBC: 8.6 10*3/uL (ref 4.0–10.5)
nRBC: 0 % (ref 0.0–0.2)

## 2019-12-03 LAB — TYPE AND SCREEN
ABO/RH(D): B POS
Antibody Screen: NEGATIVE

## 2019-12-03 LAB — ABO/RH: ABO/RH(D): B POS

## 2019-12-03 MED ORDER — OXYCODONE-ACETAMINOPHEN 5-325 MG PO TABS: 1.0000 | ORAL_TABLET | ORAL | Status: DC | PRN

## 2019-12-03 MED ORDER — OXYCODONE-ACETAMINOPHEN 5-325 MG PO TABS: 2.0000 | ORAL_TABLET | ORAL | Status: DC | PRN

## 2019-12-03 MED ORDER — LACTATED RINGERS IV SOLN
INTRAVENOUS | Status: DC
  Administered 2019-12-03 – 2019-12-04 (×2): via INTRAVENOUS

## 2019-12-03 MED ORDER — TERBUTALINE SULFATE 1 MG/ML IJ SOLN: 0.2500 mg | Freq: Once | INTRAMUSCULAR | Status: DC | PRN

## 2019-12-03 MED ORDER — LACTATED RINGERS IV SOLN
500.0000 mL | INTRAVENOUS | Status: DC | PRN
  Administered 2019-12-04: 07:00:00 500 mL via INTRAVENOUS

## 2019-12-03 MED ORDER — ACETAMINOPHEN 325 MG PO TABS: 650.0000 mg | ORAL_TABLET | ORAL | Status: DC | PRN

## 2019-12-03 MED ORDER — ONDANSETRON HCL 4 MG/2ML IJ SOLN: 4.0000 mg | Freq: Four times a day (QID) | INTRAMUSCULAR | Status: DC | PRN

## 2019-12-03 MED ORDER — SOD CITRATE-CITRIC ACID 500-334 MG/5ML PO SOLN: 30.0000 mL | ORAL | Status: DC | PRN

## 2019-12-03 MED ORDER — OXYTOCIN 40 UNITS IN NORMAL SALINE INFUSION - SIMPLE MED: 1.0000 m[IU]/min | INTRAVENOUS | Status: DC

## 2019-12-03 MED ORDER — LIDOCAINE HCL (PF) 1 % IJ SOLN
30.0000 mL | INTRAMUSCULAR | Status: AC | PRN
  Administered 2019-12-04: 11 mL via SUBCUTANEOUS

## 2019-12-03 MED ORDER — OXYTOCIN BOLUS FROM INFUSION
500.0000 mL | Freq: Once | INTRAVENOUS | Status: AC
  Administered 2019-12-04: 500 mL via INTRAVENOUS

## 2019-12-03 MED ORDER — OXYTOCIN 40 UNITS IN NORMAL SALINE INFUSION - SIMPLE MED
2.5000 [IU]/h | INTRAVENOUS | Status: DC
  Filled 2019-12-03: qty 1000

## 2019-12-03 NOTE — MAU Note (Signed)
Covid swab obtained without difficulty and pt tol well. NO symptoms 

## 2019-12-03 NOTE — MAU Note (Signed)
Ctxs since 1530. Denies LOF or vag bleeding. 3 cm last sve

## 2019-12-03 NOTE — H&P (Signed)
Margreta JourneyCheyenne C Brogan is a 23 y.o. female [phone] with IUP at 746w1d presenting for contractions since 1530.  Denies ROM, VB. active fetal movement.   [facility]  Prenatal History/Complications:  IUFD @ 30 weeks Alpha Thalassemia silent carrier:  [name] status unknown Brilliant trait:  [name] negative  Past Medical History: Past Medical History:  Diagnosis Date   Anemia    Anxiety    Depression    Headache    migrains   IUFD (intrauterine fetal death) 2016    Past Surgical History: Past Surgical History:  Procedure Laterality Date   APPENDECTOMY     HERNIA REPAIR      Obstetrical History: OB History     Gravida  5   Para  3   Term  2   Preterm  1   AB  1   Living  2      SAB  1   TAB      Ectopic      Multiple  0   Live Births  2            Social History: Social History   Socioeconomic History   Marital status: Single    Spouse name: Not on file   Number of children: Not on file   Years of education: Not on file   Highest education level: Not on file  Occupational History   Not on file  Tobacco Use   Smoking status: Former Smoker   Smokeless tobacco: Never Used   Tobacco comment: prior to +UPT  Substance and Sexual Activity   Alcohol use: No   Drug use: No   Sexual activity: Yes    Birth control/protection: None  Other Topics Concern   Not on file  Social History Narrative   Not on file   Social Determinants of Health   Financial Resource Strain:    Difficulty of Paying Living Expenses: Not on file  Food Insecurity:    Worried About Programme researcher, broadcasting/film/videounning Out of Food in the Last Year: Not on file   [employer] Out of Food in the Last Year: Not on file  Transportation Needs:    Lack of Transportation (Medical): Not on file   Lack of Transportation (Non-Medical): Not on file  Physical Activity:    Days of Exercise per Week: Not on file   Minutes of Exercise per Session: Not on file  Stress:    Feeling of Stress : Not on file  Social Connections:    Frequency of Communication with Friends and Family: Not on file   Frequency of Social Gatherings with Friends and Family: Not on file   Attends Religious Services: Not on file   Active Member of Clubs or Organizations: Not on file   Attends BankerClub or Organization Meetings: Not on file   Marital Status: Not on file    Family History: Family History  Problem Relation Age of Onset   Alcohol abuse Neg Hx    Arthritis Neg Hx    Asthma Neg Hx    Birth defects Neg Hx    Cancer Neg Hx    COPD Neg Hx    Depression Neg Hx    Diabetes Neg Hx    Drug abuse Neg Hx    Early death Neg Hx    Hearing loss Neg Hx    Heart disease Neg Hx    Hyperlipidemia Neg Hx    Hypertension Neg Hx    Kidney disease Neg Hx  Learning disabilities Neg Hx    Mental illness Neg Hx    Mental retardation Neg Hx    Miscarriages / Stillbirths Neg Hx    Stroke Neg Hx    Vision loss Neg Hx    Varicose Veins Neg Hx     Allergies: No Known Allergies  Medications Prior to Admission  Medication Sig Dispense Refill Last Dose   ferrous sulfate (FERROUSUL) 325 (65 FE) MG tablet Take 1 tablet (325 mg total) by mouth 2 (two) times daily. 60 tablet 1    Prenatal Vit-Fe Fumarate-FA (PRENATAL VITAMIN PO) Take 1 tablet by mouth daily.         Review of Systems   Constitutional: Negative for fever and chills Eyes: Negative for visual disturbances Respiratory: Negative for shortness of breath, dyspnea Cardiovascular: Negative for chest pain or palpitations  Gastrointestinal: Negative for vomiting, diarrhea and constipation.  POSITIVE for abdominal pain (contractions) Genitourinary: Negative for dysuria and urgency Musculoskeletal: Negative for back pain, joint pain, myalgias  Neurological: Negative for dizziness and headaches    Blood pressure 108/63, pulse (!) 115, temperature 98.6 F (37 C), resp. rate 18, height 5\' 2"  (1.575 m), weight 61.2 kg, last menstrual period 02/04/2019, currently breastfeeding. General  appearance: alert, cooperative and no distress Lungs: normal respiratory effort Heart: regular rate and rhythm Abdomen: soft, non-tender; bowel sounds normal Extremities: Homans sign is negative, no sign of DVT DTR's 2+ Presentation: cephalic Fetal monitoring  Baseline: 145 bpm, Variability: Good {> 6 bpm), Accelerations: Reactive and Decelerations: Absent Uterine activity  2-3 minutes, pt having to breathe through them Cx 2.5>4/70/-3   Prenatal labs: ABO, Rh: B/Positive/-- (06/01 1109) Antibody: Negative (06/01 1109) Rubella: 1.80 (06/01 1109) RPR: Non Reactive (10/15 1030)  HBsAg: Negative (06/01 1109)  HIV: Non Reactive (10/15 1030)  GBS: --11-04-2005 (12/01 0243)     Nursing Staff Provider  Office Location  [facility]  Dating  1st trim 07-09-1999  Language  ENGLISH  Anatomy US  normal  Flu Vaccine  Declined 09-29-19 Genetic Screen  NIPS: 05/27/2019-low risk AFP:   Not drawn  TDaP vaccine  Info given 09-29-19 Hgb A1C or  GTT Third trimester nl 2 hour  Rhogam   n/a   LAB RESULTS   Feeding Plan Breast  Blood Type B/Positive/-- (06/01 1109)   Contraception Depo  Antibody Negative (06/01 1109)  Circumcision Yes if boy  Rubella 1.80 (06/01 1109)  Pediatrician  [facility]  RPR Non Reactive (10/15 1030)   Support Person Mom  HBsAg Negative (06/01 1109)   Prenatal Classes No  HIV Non Reactive (10/15 1030)  BTL Consent   GBS  (For PCN allergy, check sensitivities)   VBAC Consent   Pap 12/2017 : negative  BP cuff Has one  Hgb Electro   Sickle cell trait    CF negative    SMA 2    Waterbirth  [ ]  Class [ ]  Consent [ ]  CNM visit    Prenatal Transfer Tool  Maternal Diabetes: No Genetic Screening: Normal Maternal Ultrasounds/Referrals: Normal Fetal Ultrasounds or other Referrals:  None Maternal Substance Abuse:  No Significant Maternal Medications:  None Significant Maternal Lab Results: GBS neg;  Alpha thal silent carrier ([name] status unknown)   No results found for this or any previous visit  (from the past 24 hour(s)).  Assessment: LAWRENCE ROLDAN is a 23 y.o. [phone] with an IUP at [redacted]w[redacted]d presenting for early labor.  Lives 40 minutes away, good cervical change over the past 1.5 hours,  so will admit  Plan: #Labor: expectant management #Pain:  Per request #FWB Cat 1   Christin Fudge 12/03/2019, 9:34 PM

## 2019-12-04 ENCOUNTER — Encounter (HOSPITAL_COMMUNITY): Payer: Self-pay | Admitting: Family Medicine

## 2019-12-04 ENCOUNTER — Inpatient Hospital Stay (HOSPITAL_COMMUNITY): Payer: 59 | Admitting: Anesthesiology

## 2019-12-04 DIAGNOSIS — Z3A38 38 weeks gestation of pregnancy: Secondary | ICD-10-CM

## 2019-12-04 LAB — CBC
HCT: 28.4 % — ABNORMAL LOW (ref 36.0–46.0)
Hemoglobin: 9.3 g/dL — ABNORMAL LOW (ref 12.0–15.0)
MCH: 24.1 pg — ABNORMAL LOW (ref 26.0–34.0)
MCHC: 32.7 g/dL (ref 30.0–36.0)
MCV: 73.6 fL — ABNORMAL LOW (ref 80.0–100.0)
Platelets: 130 10*3/uL — ABNORMAL LOW (ref 150–400)
RBC: 3.86 MIL/uL — ABNORMAL LOW (ref 3.87–5.11)
RDW: 15.6 % — ABNORMAL HIGH (ref 11.5–15.5)
WBC: 7.1 10*3/uL (ref 4.0–10.5)
nRBC: 0 % (ref 0.0–0.2)

## 2019-12-04 LAB — SARS CORONAVIRUS 2 (TAT 6-24 HRS): SARS Coronavirus 2: NEGATIVE

## 2019-12-04 LAB — RPR: RPR Ser Ql: NONREACTIVE

## 2019-12-04 MED ORDER — DIBUCAINE (PERIANAL) 1 % EX OINT: 1.0000 "application " | TOPICAL_OINTMENT | CUTANEOUS | Status: DC | PRN

## 2019-12-04 MED ORDER — IBUPROFEN 600 MG PO TABS
600.0000 mg | ORAL_TABLET | Freq: Four times a day (QID) | ORAL | Status: DC
  Administered 2019-12-04 – 2019-12-06 (×8): 600 mg via ORAL
  Filled 2019-12-04 (×9): qty 1

## 2019-12-04 MED ORDER — SENNOSIDES-DOCUSATE SODIUM 8.6-50 MG PO TABS
2.0000 | ORAL_TABLET | ORAL | Status: DC
  Administered 2019-12-04 – 2019-12-05 (×2): 2 via ORAL
  Filled 2019-12-04 (×2): qty 2

## 2019-12-04 MED ORDER — PRENATAL MULTIVITAMIN CH
1.0000 | ORAL_TABLET | Freq: Every day | ORAL | Status: DC
  Administered 2019-12-04 – 2019-12-06 (×3): 1 via ORAL
  Filled 2019-12-04 (×4): qty 1

## 2019-12-04 MED ORDER — EPHEDRINE 5 MG/ML INJ: 10.0000 mg | INTRAVENOUS | Status: DC | PRN

## 2019-12-04 MED ORDER — ACETAMINOPHEN 325 MG PO TABS
650.0000 mg | ORAL_TABLET | ORAL | Status: DC | PRN
  Administered 2019-12-04 (×2): 650 mg via ORAL
  Filled 2019-12-04 (×2): qty 2

## 2019-12-04 MED ORDER — WITCH HAZEL-GLYCERIN EX PADS: 1.0000 "application " | MEDICATED_PAD | CUTANEOUS | Status: DC | PRN

## 2019-12-04 MED ORDER — ONDANSETRON HCL 4 MG PO TABS: 4.0000 mg | ORAL_TABLET | ORAL | Status: DC | PRN

## 2019-12-04 MED ORDER — LACTATED RINGERS IV SOLN
500.0000 mL | Freq: Once | INTRAVENOUS | Status: AC
  Administered 2019-12-04: 500 mL via INTRAVENOUS

## 2019-12-04 MED ORDER — SODIUM CHLORIDE (PF) 0.9 % IJ SOLN
INTRAMUSCULAR | Status: DC | PRN
  Administered 2019-12-04: 12 mL/h via EPIDURAL

## 2019-12-04 MED ORDER — PHENYLEPHRINE 40 MCG/ML (10ML) SYRINGE FOR IV PUSH (FOR BLOOD PRESSURE SUPPORT): 80.0000 ug | PREFILLED_SYRINGE | INTRAVENOUS | Status: DC | PRN

## 2019-12-04 MED ORDER — DIPHENHYDRAMINE HCL 25 MG PO CAPS: 25.0000 mg | ORAL_CAPSULE | Freq: Four times a day (QID) | ORAL | Status: DC | PRN

## 2019-12-04 MED ORDER — TETANUS-DIPHTH-ACELL PERTUSSIS 5-2.5-18.5 LF-MCG/0.5 IM SUSP: 0.5000 mL | Freq: Once | INTRAMUSCULAR | Status: DC

## 2019-12-04 MED ORDER — FENTANYL-BUPIVACAINE-NACL 0.5-0.125-0.9 MG/250ML-% EP SOLN
12.0000 mL/h | EPIDURAL | Status: DC | PRN
  Filled 2019-12-04: qty 250

## 2019-12-04 MED ORDER — COCONUT OIL OIL
1.0000 "application " | TOPICAL_OIL | Status: DC | PRN
  Administered 2019-12-06: 1 via TOPICAL

## 2019-12-04 MED ORDER — ZOLPIDEM TARTRATE 5 MG PO TABS: 5.0000 mg | ORAL_TABLET | Freq: Every evening | ORAL | Status: DC | PRN

## 2019-12-04 MED ORDER — TERBUTALINE SULFATE 1 MG/ML IJ SOLN: 0.2500 mg | Freq: Once | INTRAMUSCULAR | Status: DC | PRN

## 2019-12-04 MED ORDER — DIPHENHYDRAMINE HCL 50 MG/ML IJ SOLN: 12.5000 mg | INTRAMUSCULAR | Status: DC | PRN

## 2019-12-04 MED ORDER — BENZOCAINE-MENTHOL 20-0.5 % EX AERO
1.0000 "application " | INHALATION_SPRAY | CUTANEOUS | Status: DC | PRN
  Administered 2019-12-05: 1 via TOPICAL
  Filled 2019-12-04: qty 56

## 2019-12-04 MED ORDER — ONDANSETRON HCL 4 MG/2ML IJ SOLN
4.0000 mg | INTRAMUSCULAR | Status: DC | PRN
  Filled 2019-12-04: qty 2

## 2019-12-04 MED ORDER — OXYTOCIN 40 UNITS IN NORMAL SALINE INFUSION - SIMPLE MED
1.0000 m[IU]/min | INTRAVENOUS | Status: DC
  Administered 2019-12-04: 1 m[IU]/min via INTRAVENOUS
  Administered 2019-12-04: 3 m[IU]/min via INTRAVENOUS

## 2019-12-04 MED ORDER — SIMETHICONE 80 MG PO CHEW: 80.0000 mg | CHEWABLE_TABLET | ORAL | Status: DC | PRN

## 2019-12-04 NOTE — Lactation Note (Signed)
This note was copied from a baby's chart. Lactation Consultation Note  Patient Name: Kari [name] [id number] Date: 12/04/2019 Reason for consult: Initial assessment;Early term 80-38.6wks  9 hours old ETI amel who is being exclusively BF by his mother, she's a P3 and experienced BF. She BF her first child for 2 months and experienced BF difficulties with him due to a difficult latch, she ended up pumping and bottle feeding. She didn't experience any BF difficulties (other than the "painful" beginning) with her second child, she BF for 6 months. She participated in the [facility] program at the [facility] and she's already familiar with hand expression, she told LC she's been able to get colostrum when doing so, praise her for her efforts.  Mom doing STS with baby when entering the room, she told LC baby just fed. Asked mom to call for assistance when needed. Reviewed normal newborn behavior, feeding cues and cluster feeding. When LC was speaking to mom baby suddenly woke up and mom put him to breast in typical cradle hold; she also had baby covered with blankets. Noticed that latch could be deeper but audible swallows still were noted. Advised mom to try feeding baby in cross cradle hold on the next feeding and to avoid covering with blankets; baby still nursing when exiting the room at the 5 minutes mark.  Feeding plan:  1. Encouraged mom to feed baby STS 8-12 times/24 hours or sooner if feeding cues are present 2. Hand expression and spoon feeding were also encouraged  BF brochure, BF resources and feeding diary were reviewed. Mom reported all questions and concerns were answered, she's aware of [place] OP services and will call PRN.  Maternal Data Formula Feeding for Exclusion: No Has patient been taught Hand Expression?: Yes Does the patient have breastfeeding experience prior to this delivery?: Yes  Feeding Feeding Type: Breast Fed  LATCH Score Latch: Grasps breast easily, tongue down, lips  flanged, rhythmical sucking.  Audible Swallowing: A few with stimulation  Type of Nipple: Everted at rest and after stimulation  Comfort (Breast/Nipple): Soft / non-tender  Hold (Positioning): Assistance needed to correctly position infant at breast and maintain latch.(mom does typical cradle instead of cross cradle, latch could be deeper)  LATCH Score: 8  Interventions Interventions: Breast feeding basics reviewed;Assisted with latch;Skin to skin;Support pillows  Lactation Tools Discussed/Used WIC Program: Yes   Consult Status Consult Status: Follow-up Date: 12/05/19 Follow-up type: In-patient    Nechama Escutia Francene Boyers 12/04/2019, 6:47 PM

## 2019-12-04 NOTE — Anesthesia Procedure Notes (Signed)
Epidural Patient location during procedure: OB Start time: 12/04/2019 2:47 AM End time: 12/04/2019 3:02 AM  Staffing Anesthesiologist: Lynda Rainwater, MD Performed: anesthesiologist   Preanesthetic Checklist Completed: patient identified, IV checked, site marked, risks and benefits discussed, surgical consent, monitors and equipment checked, pre-op evaluation and timeout performed  Epidural Patient position: sitting Prep: ChloraPrep Patient monitoring: heart rate, cardiac monitor, continuous pulse ox and blood pressure Approach: midline Location: L2-L3 Injection technique: LOR saline  Needle:  Needle type: Tuohy  Needle gauge: 17 G Needle length: 9 cm Needle insertion depth: 5 cm Catheter type: closed end flexible Catheter size: 20 Guage Catheter at skin depth: 9 cm Test dose: negative  Assessment Events: blood not aspirated, injection not painful, no injection resistance, no paresthesia and negative IV test  Additional Notes Reason for block:procedure for pain

## 2019-12-04 NOTE — Anesthesia Preprocedure Evaluation (Signed)
Anesthesia Evaluation  Patient identified by MRN, date of birth, ID band Patient awake    Airway Mallampati: I       Dental  (+) Teeth Intact   Pulmonary neg pulmonary ROS, former smoker,    breath sounds clear to auscultation       Cardiovascular negative cardio ROS   Rhythm:Regular Rate:Normal     Neuro/Psych  Headaches, PSYCHIATRIC DISORDERS Anxiety Depression    GI/Hepatic negative GI ROS, Neg liver ROS,   Endo/Other  negative endocrine ROS  Renal/GU negative Renal ROS  negative genitourinary   Musculoskeletal negative musculoskeletal ROS (+)   Abdominal   Peds negative pediatric ROS (+)  Hematology negative hematology ROS (+)   Anesthesia Other Findings   Reproductive/Obstetrics (+) Pregnancy                             Lab Results  Component Value Date   WBC 8.6 12/03/2019   HGB 9.8 (L) 12/03/2019   HCT 29.5 (L) 12/03/2019   MCV 73.2 (L) 12/03/2019   PLT 144 (L) 12/03/2019     Anesthesia Physical  Anesthesia Plan  ASA: II  Anesthesia Plan: Epidural   Post-op Pain Management:    Induction:   PONV Risk Score and Plan:   Airway Management Planned:   Additional Equipment:   Intra-op Plan:   Post-operative Plan:   Informed Consent: I have reviewed the patients History and Physical, chart, labs and discussed the procedure including the risks, benefits and alternatives for the proposed anesthesia with the patient or authorized representative who has indicated his/her understanding and acceptance.       Plan Discussed with:   Anesthesia Plan Comments:         Anesthesia Quick Evaluation

## 2019-12-04 NOTE — Discharge Summary (Signed)
Postpartum Discharge Summary     Patient Name: Kari [name] DOB: Jan 27, 1996 MRN: [id number]  Date of admission: 12/03/2019 Delivering Provider: [place]   Date of discharge: 12/06/2019  Admitting diagnosis: Encounter for induction of labor [Z34.90] Intrauterine pregnancy: [redacted]w[redacted]d    Secondary diagnosis:  Active Problems:   Encounter for induction of labor   NSVD (normal spontaneous vaginal delivery)  Additional problems: NA     Discharge diagnosis: Term Pregnancy Delivered                                                                                                Post partum procedures:NA  Augmentation: AROM and Pitocin  Complications: None  Hospital course:  Onset of Labor With Vaginal Delivery     23y.o. yo [id number] [id number] admitted in Active Labor on 12/03/2019. Patient had an uncomplicated labor course as follows: patient made cervical change while in MAU; after contractions tapered off over night she was augmented with pitocin and AROM. She had an NSVD at 0852 over an intact perineum on 12/11. Membrane Rupture Time/Date: 6:50 AM ,12/04/2019   Intrapartum Procedures: Episiotomy: None [1]                                         Lacerations:  None [1]  Patient had a delivery of a Viable infant. 12/04/2019  Information for the patient's newborn:  Kari [name], Berquist0[[id number]]Delivery Method: Vaginal, Spontaneous(Filed from Delivery Summary)     Pateint had an uncomplicated postpartum course.  She is ambulating, tolerating a regular diet, passing flatus, and urinating well. Patient is discharged home in stable condition on 12/06/19.  Delivery time: 8:52 AM    Magnesium Sulfate received: No BMZ received: No Rhophylac:No MMR:No Transfusion:No  Physical exam  Vitals:   12/05/19 0512 12/05/19 1518 12/05/19 2100 12/06/19 0600  BP: 105/72 95/68 101/60 101/61  Pulse: 83 (!) 101 92 80  Resp: _0 Temp: 98.2 F (36.8 C)  98.7 F (37.1 C) 98.5 F (36.9 C) 98 F (36.7 C)  TempSrc: Oral Oral Oral Oral  SpO2: 100% 100%  100%  Weight:      Height:       General: alert, cooperative and no distress Lochia: appropriate Uterine Fundus: firm Incision: N/A DVT Evaluation: No evidence of DVT seen on physical exam. Labs: Lab Results  Component Value Date   WBC 7.1 12/04/2019   HGB 9.3 (L) 12/04/2019   HCT 28.4 (L) 12/04/2019   MCV 73.6 (L) 12/04/2019   PLT 130 (L) 12/04/2019   CMP Latest Ref Rng & Units 09/02/2017  Glucose 65 - 99 mg/dL 86  BUN 6 - 20 mg/dL 15  Creatinine 0.44 - 1.00 mg/dL 0.46  Sodium 135 - 145 mmol/L 139  Potassium 3.5 - 5.1 mmol/L 3.9  Chloride 101 - 111 mmol/L 107  CO2 22 - 32 mmol/L 27  Calcium 8.9 - 10.3 mg/dL 9.2  Total Protein 6.5 - 8.1  g/dL 6.8  Total Bilirubin 0.3 - 1.2 mg/dL 0.6  Alkaline Phos 38 - 126 U/L 88  AST 15 - 41 U/L 17  ALT 14 - 54 U/L 11(L)    Discharge instruction: per After Visit Summary and "Baby and Me Booklet".  After visit meds:  Allergies as of 12/06/2019   No Known Allergies     Medication List    TAKE these medications   ferrous sulfate 325 (65 FE) MG tablet Commonly known as: FerrouSul Take 1 tablet (325 mg total) by mouth 2 (two) times daily.   ibuprofen 600 MG tablet Commonly known as: ADVIL Take 1 tablet (600 mg total) by mouth every 6 (six) hours.   PRENATAL VITAMIN PO Take 1 tablet by mouth daily.       Diet: routine diet  Activity: Advance as tolerated. Pelvic rest for 6 weeks.   Outpatient follow up:6 weeks Follow up Appt: Future Appointments  Date Time Provider [place]  01/07/2020 10:30 AM Lajean Manes, CNM [facility] None   Follow up Visit: [place] Follow up in 6 week(s).   Contact information: [address] [phone]          Message sent to [facility] to schedule for PP visit.  Please schedule this  patient for Postpartum visit in: 6 weeks with the following provider: APP For C/S patients schedule nurse incision check in weeks 2 weeks: no Low risk pregnancy complicated by: none Delivery mode:  SVD Anticipated Birth Control:  Depo given in hospital PP Procedures needed: NA  Schedule Integrated [place] visit: yes.  Message sent to add [facility] on 12/13 due to risk for PP depression.      Newborn Data: Live born female  Birth Weight:  3510 g, 7lb 11.8 oz APGAR: 9, 9  Newborn Delivery   Birth date/time: 12/04/2019 08:52:00 Delivery type: Vaginal, Spontaneous      Baby Feeding: Breast Disposition:home with mother   12/06/2019 Fatima Blank, CNM

## 2019-12-04 NOTE — Progress Notes (Signed)
Patient Vitals for the past 4 hrs:  BP Temp Temp src Pulse Resp SpO2  12/04/19 0700 (!) 99/59 -- -- 90 18 --  12/04/19 0635 (!) 89/47 98.7 F (37.1 C) Oral 92 18 --  12/04/19 0601 98/68 -- -- 80 16 --  12/04/19 0530 96/64 -- -- (!) 111 18 --  12/04/19 0500 102/62 -- -- 90 18 --  12/04/19 0430 100/65 -- -- 88 16 100 %  12/04/19 0407 110/64 -- -- 96 -- --  12/04/19 0400 (!) 89/56 -- -- 91 -- --  12/04/19 0330 103/62 -- -- 91 17 99 %  12/04/19 0325 103/64 -- -- 87 18 100 %  12/04/19 0320 (!) 102/59 -- -- 84 16 100 %   Comfortable w/epidural. CTx spaced out, so Pitocin was started.  Cx now 5/70/-2.  AROM w/clear fluid. Ctx q 2-4 minutes, pitocin at 66mu/min. Continue present mgt.

## 2019-12-05 NOTE — Anesthesia Postprocedure Evaluation (Signed)
Anesthesia Post Note  Patient: Kari [name]  Procedure(s) Performed: AN AD HOC LABOR EPIDURAL     Patient location during evaluation: Mother Baby Anesthesia Type: Epidural Level of consciousness: awake and alert, oriented and patient cooperative Pain management: pain level controlled Vital Signs Assessment: post-procedure vital signs reviewed and stable Respiratory status: spontaneous breathing Cardiovascular status: stable Postop Assessment: no headache, epidural receding, patient able to bend at knees and no signs of nausea or vomiting Anesthetic complications: no Comments: Pain score 5. Pt. States she is walking.     Last Vitals:  Vitals:   12/05/19 0057 12/05/19 0512  BP: (!) 104/59 105/72  Pulse: 89 83  Resp: 18 18  Temp: 37.1 C 36.8 C  SpO2:  100%    Last Pain:  Vitals:   12/05/19 0715  TempSrc:   PainSc: Asleep   Pain Goal:                   [facility]

## 2019-12-05 NOTE — Progress Notes (Signed)
Post Partum Day 1 S/p SVD 12/04/19 at 0852  Subjective: no complaints, up ad lib, voiding, tolerating PO and + flatus  Requests discharge tomorrow   Objective: Blood pressure 105/72, pulse 83, temperature 98.2 F (36.8 C), temperature source Oral, resp. rate 18, height 5\' 2"  (1.575 m), weight 61.2 kg, last menstrual period 02/04/2019, SpO2 100 %, unknown if currently breastfeeding.  Physical Exam:  General: alert, cooperative, appears stated age and no distress Lochia: appropriate Uterine Fundus: firm DVT Evaluation: No evidence of DVT seen on physical exam.  Recent Labs    12/03/19 2214 12/04/19 0929  HGB 9.8* 9.3*  HCT 29.5* 28.4*    Assessment/Plan: Plan for discharge tomorrow   LOS: 2 days   Darlina Rumpf, CNM 12/05/2019, 0930
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
## 2019-12-05 NOTE — Lactation Note (Signed)
This note was copied from a baby's chart. Lactation Consultation Note  Patient Name: Kari [name] [id number] Date: 12/05/2019 Reason for consult: Follow-up assessment;Early term 37-38.6wks  1255 - I followed up with [name] to check on her progress with breast feeding. She reports that her son, Ria Clock, has been cluster feeding today. [name] states that baby is waking up and feeding better today. He cluster fed last night, and he finally fell asleep around 6 am. She and baby slept several hours at that time.   She just fed him for about 15 minutes prior to my entry, and she was holding him. He began to cue, and I observed her place him in football hold on her left breast. I provided support pillows.  She hand expressed colostrum, but he closed his mouth and went to sleep. She remarked that she would like to take a nap, but he has not been willing to sleep in the bassinet. I offered to swaddle him, and she agreed. I swaddled him and placed him in the bassinet, and he appeared relaxed and sleepy.  The RN entered the room to check in. [name] reports that baby has had two voids today thus far. She feels that he's latching well, and she denies pain with latch. Her left nipple appeared everted and in tact.  I educated on day 2 infant feeding patterns and nighttime cluster feeding. [name]' goal is to exclusively breast feed. She wants to avoid pacifier use.   I offered to assist further today as needed, and encouraged her to call PRN. I wrote my name on the board. She verbalized understanding. She had no further questions or concerns at this time.   Maternal Data Formula Feeding for Exclusion: No Has patient been taught Hand Expression?: Yes Does the patient have breastfeeding experience prior to this delivery?: Yes  Feeding Feeding Type: Breast Fed   Interventions Interventions: Hand express;Breast feeding basics reviewed;Support pillows  Lactation Tools  Discussed/Used WIC Program: Yes   Consult Status Consult Status: Follow-up Date: 12/06/19 Follow-up type: In-patient    Lenore Manner 12/05/2019, 1:12 PM

## 2019-12-06 MED ORDER — IBUPROFEN 600 MG PO TABS: 600.0000 mg | ORAL_TABLET | Freq: Four times a day (QID) | ORAL | 0 refills | Status: DC

## 2019-12-06 MED ORDER — MEDROXYPROGESTERONE ACETATE 150 MG/ML IM SUSP
150.0000 mg | Freq: Once | INTRAMUSCULAR | Status: AC
  Administered 2019-12-06: 150 mg via INTRAMUSCULAR
  Filled 2019-12-06: qty 1
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
## 2019-12-06 NOTE — Lactation Note (Signed)
This note was copied from a baby's chart. Lactation Consultation Note  Patient Name: Kari [name] [id number] Date: 12/06/2019 Reason for consult: Follow-up assessment;Early term 63-38.6wks Baby is 49 hours old/7% weight loss.  Mom states feedings are going well.  She is also pumping and bottle feeding because breasts are becoming full.  She last obtained 40 mls of transitional milk.  No questions or concerns.  Reviewed lactation outpatient services and encouraged to call prn.  Maternal Data    Feeding Feeding Type: Breast Milk  LATCH Score                   Interventions    Lactation Tools Discussed/Used     Consult Status Consult Status: Complete Follow-up type: Call as needed    Ave Filter 12/06/2019, 10:22 AM

## 2019-12-07 ENCOUNTER — Other Ambulatory Visit (HOSPITAL_COMMUNITY): Payer: 59

## 2019-12-08 ENCOUNTER — Ambulatory Visit (HOSPITAL_COMMUNITY): Payer: 59

## 2019-12-09 ENCOUNTER — Inpatient Hospital Stay (HOSPITAL_COMMUNITY): Payer: 59

## 2019-12-09 ENCOUNTER — Inpatient Hospital Stay (HOSPITAL_COMMUNITY): Admission: AD | Admit: 2019-12-09 | Payer: 59 | Source: Home / Self Care | Admitting: Family Medicine
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
## 2019-12-21 ENCOUNTER — Other Ambulatory Visit: Payer: Self-pay

## 2019-12-21 ENCOUNTER — Ambulatory Visit: Payer: 59 | Admitting: Clinical

## 2020-01-07 ENCOUNTER — Ambulatory Visit: Payer: 59 | Admitting: Certified Nurse Midwife

## 2020-01-21 ENCOUNTER — Telehealth: Payer: Self-pay | Admitting: Certified Nurse Midwife

## 2020-02-29 ENCOUNTER — Ambulatory Visit (INDEPENDENT_AMBULATORY_CARE_PROVIDER_SITE_OTHER): Payer: 59 | Admitting: Certified Nurse Midwife

## 2020-02-29 ENCOUNTER — Other Ambulatory Visit (HOSPITAL_COMMUNITY)
Admission: RE | Admit: 2020-02-29 | Discharge: 2020-02-29 | Disposition: A | Discharge status: Home / Self Care | Payer: 59 | Source: Ambulatory Visit | Attending: Certified Nurse Midwife | Admitting: Certified Nurse Midwife

## 2020-02-29 ENCOUNTER — Other Ambulatory Visit: Payer: Self-pay

## 2020-02-29 VITALS — BP 112/73 | HR 103 | Temp 98.7°F | Ht 62.0 in | Wt 122.8 lb

## 2020-02-29 DIAGNOSIS — N898 Other specified noninflammatory disorders of vagina: Secondary | ICD-10-CM | POA: Insufficient documentation

## 2020-02-29 DIAGNOSIS — Z3042 Encounter for surveillance of injectable contraceptive: Secondary | ICD-10-CM

## 2020-02-29 MED ORDER — MEDROXYPROGESTERONE ACETATE 150 MG/ML IM SUSP
150.0000 mg | Freq: Once | INTRAMUSCULAR | Status: AC
  Administered 2020-02-29: 150 mg via INTRAMUSCULAR

## 2020-02-29 MED ORDER — TINIDAZOLE 500 MG PO TABS: 2.0000 g | ORAL_TABLET | Freq: Every day | ORAL | 0 refills | Status: DC

## 2020-02-29 NOTE — Addendum Note (Signed)
Addended by: Maretta Bees on: 02/29/2020 04:22 PM   Modules accepted: Orders

## 2020-02-29 NOTE — Progress Notes (Signed)
History:  [name] is a 24 y.o. [id number] who presents to clinic today for vaginal discharge. Patient reports malodorous discharge for the past week. Patient reports irregular spotting on depo so unsure of real color of discharge d/t spotting. Denies vaginal irritation or itching.    The following portions of the patient's history were reviewed and updated as appropriate: allergies, current medications, family history, past medical history, social history, past surgical history and problem list.  Review of Systems:  Review of Systems  Constitutional: Negative.   Respiratory: Negative.   Cardiovascular: Negative.   Gastrointestinal: Negative.   Genitourinary:       Vaginal discharge and odor  Neurological: Negative.   Psychiatric/Behavioral: Negative.      Objective:  Physical Exam BP 112/73   Pulse (!) 103   Temp 98.7 F (37.1 C)   Ht 5\' 2"  (1.575 m)   Wt 122 lb 12.8 oz (55.7 kg)   BMI 22.46 kg/m  Physical Exam HENT:     Head: Normocephalic.  Cardiovascular:     Rate and Rhythm: Normal rate and regular rhythm.  Pulmonary:     Effort: Pulmonary effort is normal. No respiratory distress.     Breath sounds: Normal breath sounds. No wheezing.  Abdominal:     General: There is no distension.     Palpations: Abdomen is soft.     Tenderness: There is no abdominal tenderness. There is no guarding.  Genitourinary:    General: Normal vulva.     Cervix: No cervical motion tenderness.     Uterus: Normal.      Adnexa: Right adnexa normal and left adnexa normal.     Comments: Blind swabs collected Neurological:     Mental Status: She is alert and oriented to person, place, and time.  Psychiatric:        Mood and Affect: Mood normal.        Behavior: Behavior normal.        Thought Content: Thought content normal.     Assessment & Plan:  1. Vaginal odor - Will treat patient based on clinical symptoms while results pending  - Patient reports going to urgent  clinic but they did not give antibiotics at that time  - Cervicovaginal ancillary only( [place]) - tinidazole (TINDAMAX) 500 MG tablet; Take 4 tablets (2,000 mg total) by mouth daily with breakfast. For two days  Dispense: 8 tablet; Refill: 0  2. Vaginal discharge - [facility]   [facility], Sharyon Cable 02/29/2020 3:59 PM

## 2020-02-29 NOTE — Progress Notes (Addendum)
GYN presents for malodorous discharge x 1 week.  Denies pelvic pain, itching, burning, chills, fever, NV.   Office supplied DEPO given in RD, tolerated well. Next DEPO 05/24-06/7, 2021  Administrations This Visit    medroxyPROGESTERone (DEPO-PROVERA) injection 150 mg    Admin Date 02/29/2020 Action Given Dose 150 mg Route Intramuscular Administered By Maretta Bees, RMA

## 2020-03-01 ENCOUNTER — Ambulatory Visit: Payer: 59

## 2020-03-01 LAB — CERVICOVAGINAL ANCILLARY ONLY
Bacterial Vaginitis (gardnerella): POSITIVE — AB
Candida Glabrata: NEGATIVE
Candida Vaginitis: POSITIVE — AB
Chlamydia: NEGATIVE
Comment: NEGATIVE
Comment: NEGATIVE
Comment: NEGATIVE
Comment: NEGATIVE
Comment: NEGATIVE
Comment: NORMAL
Neisseria Gonorrhea: NEGATIVE
Trichomonas: NEGATIVE

## 2020-05-09 ENCOUNTER — Other Ambulatory Visit (HOSPITAL_COMMUNITY)
Admission: RE | Admit: 2020-05-09 | Discharge: 2020-05-09 | Disposition: A | Discharge status: Home / Self Care | Payer: 59 | Source: Ambulatory Visit | Attending: Obstetrics and Gynecology | Admitting: Obstetrics and Gynecology

## 2020-05-09 ENCOUNTER — Ambulatory Visit (INDEPENDENT_AMBULATORY_CARE_PROVIDER_SITE_OTHER): Payer: 59

## 2020-05-09 ENCOUNTER — Other Ambulatory Visit: Payer: Self-pay

## 2020-05-09 VITALS — BP 108/70 | HR 110 | Ht 62.0 in | Wt 123.0 lb

## 2020-05-09 DIAGNOSIS — N898 Other specified noninflammatory disorders of vagina: Secondary | ICD-10-CM | POA: Insufficient documentation

## 2020-05-09 NOTE — Progress Notes (Signed)
SUBJECTIVE:  24 y.o. female complains of foul vaginal discharge for 2 week(s). Denies abnormal vaginal bleeding or significant pelvic pain or fever. No UTI symptoms. Denies history of known exposure to STD.  No LMP recorded. Patient has had an injection.  OBJECTIVE:  She appears well, afebrile. Urine dipstick: not done.  ASSESSMENT:  Vaginal Discharge  Vaginal Odor   PLAN:  GC, chlamydia, trichomonas, BVAG, CVAG probe sent to lab. Treatment: To be determined once lab results are received ROV prn if symptoms persist or worsen.

## 2020-05-10 ENCOUNTER — Other Ambulatory Visit: Payer: Self-pay | Admitting: Obstetrics

## 2020-05-10 DIAGNOSIS — N760 Acute vaginitis: Secondary | ICD-10-CM

## 2020-05-10 DIAGNOSIS — N898 Other specified noninflammatory disorders of vagina: Secondary | ICD-10-CM

## 2020-05-10 DIAGNOSIS — B9689 Other specified bacterial agents as the cause of diseases classified elsewhere: Secondary | ICD-10-CM

## 2020-05-10 LAB — CERVICOVAGINAL ANCILLARY ONLY
Bacterial Vaginitis (gardnerella): POSITIVE — AB
Candida Glabrata: NEGATIVE
Candida Vaginitis: NEGATIVE
Chlamydia: NEGATIVE
Comment: NEGATIVE
Comment: NEGATIVE
Comment: NEGATIVE
Comment: NEGATIVE
Comment: NEGATIVE
Comment: NORMAL
Neisseria Gonorrhea: NEGATIVE
Trichomonas: NEGATIVE

## 2020-05-10 MED ORDER — TINIDAZOLE 500 MG PO TABS: 1000.0000 mg | ORAL_TABLET | Freq: Every day | ORAL | 2 refills | Status: DC

## 2020-05-11 ENCOUNTER — Encounter (HOSPITAL_BASED_OUTPATIENT_CLINIC_OR_DEPARTMENT_OTHER): Payer: Self-pay

## 2020-05-11 ENCOUNTER — Emergency Department (HOSPITAL_BASED_OUTPATIENT_CLINIC_OR_DEPARTMENT_OTHER): Payer: 59

## 2020-05-11 ENCOUNTER — Other Ambulatory Visit: Payer: Self-pay

## 2020-05-11 ENCOUNTER — Telehealth: Payer: Self-pay

## 2020-05-11 ENCOUNTER — Emergency Department (HOSPITAL_BASED_OUTPATIENT_CLINIC_OR_DEPARTMENT_OTHER)
Admission: EM | Admit: 2020-05-11 | Discharge: 2020-05-11 | Disposition: A | Discharge status: Home / Self Care | Payer: 59 | Attending: Emergency Medicine | Admitting: Emergency Medicine

## 2020-05-11 DIAGNOSIS — Z20822 Contact with and (suspected) exposure to covid-19: Secondary | ICD-10-CM | POA: Diagnosis not present

## 2020-05-11 DIAGNOSIS — Z79899 Other long term (current) drug therapy: Secondary | ICD-10-CM | POA: Insufficient documentation

## 2020-05-11 DIAGNOSIS — J069 Acute upper respiratory infection, unspecified: Secondary | ICD-10-CM

## 2020-05-11 DIAGNOSIS — F17290 Nicotine dependence, other tobacco product, uncomplicated: Secondary | ICD-10-CM | POA: Insufficient documentation

## 2020-05-11 DIAGNOSIS — R05 Cough: Secondary | ICD-10-CM | POA: Diagnosis present

## 2020-05-11 LAB — SARS CORONAVIRUS 2 BY RT PCR (HOSPITAL ORDER, PERFORMED IN ~~LOC~~ HOSPITAL LAB): SARS Coronavirus 2: NEGATIVE

## 2020-05-11 LAB — PREGNANCY, URINE: Preg Test, Ur: NEGATIVE

## 2020-05-11 MED ORDER — ACETAMINOPHEN 325 MG PO TABS
650.0000 mg | ORAL_TABLET | Freq: Once | ORAL | Status: AC
  Administered 2020-05-11: 650 mg via ORAL
  Filled 2020-05-11: qty 2

## 2020-05-11 MED ORDER — SODIUM CHLORIDE 0.9 % IV BOLUS: 1000.0000 mL | Freq: Once | INTRAVENOUS | Status: DC

## 2020-05-11 NOTE — Discharge Instructions (Addendum)
At this time there does not appear to be the presence of an emergent medical condition, however there is always the potential for conditions to change. Please read and follow the below instructions.  Please return to the Emergency Department immediately for any new or worsening symptoms. Please be sure to follow up with your Primary Care Provider within one week regarding your visit today; please call their office to schedule an appointment even if you are feeling better for a follow-up visit. You may continue using over-the-counter Tylenol and ibuprofen as directed on the packaging to help with your symptoms.  Please drink plenty of water to avoid dehydration and get plenty of rest.  Get help right away if: You feel pain or pressure in your chest. You have shortness of breath. You faint or feel like you will faint. You cough up blood You keep throwing up (vomiting). You feel confused. You have neck stiffness You have any new/concerning or worsening of symptoms  Please read the additional information packets attached to your discharge summary.  Do not take your medicine if  develop an itchy rash, swelling in your mouth or lips, or difficulty breathing; call 911 and seek immediate emergency medical attention if this occurs.  Note: Portions of this text may have been transcribed using voice recognition software. Every effort was made to ensure accuracy; however, inadvertent computerized transcription errors may still be present.

## 2020-05-11 NOTE — ED Triage Notes (Signed)
Pt c/o flu like sx x 2 days-states she is unsure if fever due to she does not have a thermometer-NAD-steady gait

## 2020-05-11 NOTE — ED Provider Notes (Addendum)
[facility] EMERGENCY DEPARTMENT Provider Note   CSN: [id number] Arrival date & time: 05/11/20  1149     History Chief Complaint  Patient presents with  . Cough    Kari [name] is a 24 y.o. female presents today for fever nonproductive cough.  Patient reports that over the last 2 days she has developed intermittent low-grade fevers at home which she has been treating with Tylenol as well as a mild nonproductive cough without associated chest pain or shortness of breath.  Patient arrives today with 3 of her children who are all experiencing the same symptoms.  Patient denies sore throat, neck stiffness, vision changes, chest pain, shortness of breath, hemoptysis, abdominal pain, nausea/vomiting, diarrhea, extremity swelling/color change or additional concerns.  Of note patient reports that [facility] was [facility] with Covid in March 2021.  She reports that she recovered well from this illness and never needed hospitalization. HPI     Past Medical History:  Diagnosis Date  . Anemia   . Anxiety   . Depression   . Headache    migrains  . IUFD (intrauterine fetal death) 2015-04-16    Patient Active Problem List   Diagnosis Date Noted  . NSVD (normal spontaneous vaginal delivery) 12/06/2019  . Encounter for induction of labor 12/03/2019  . Alpha thalassemia silent carrier 06/08/2019  . Sickle cell trait (HCC) 05/25/2019  . Supervision of high risk pregnancy, antepartum 04/27/2019  . Vitamin D deficiency 01/22/2018  . History of IUFD 10/07/2015    Past Surgical History:  Procedure Laterality Date  . APPENDECTOMY    . HERNIA REPAIR       OB History    Gravida  5   Para  4   Term  3   Preterm  1   AB  1   Living  3     SAB  1   TAB      Ectopic      Multiple  0   Live Births  3           Family History  Problem Relation Age of Onset  . Alcohol abuse Neg Hx   . Arthritis Neg Hx   . Asthma Neg Hx   . Birth defects  Neg Hx   . Cancer Neg Hx   . COPD Neg Hx   . Depression Neg Hx   . Diabetes Neg Hx   . Drug abuse Neg Hx   . Early death Neg Hx   . Hearing loss Neg Hx   . Heart disease Neg Hx   . Hyperlipidemia Neg Hx   . Hypertension Neg Hx   . Kidney disease Neg Hx   . Learning disabilities Neg Hx   . Mental illness Neg Hx   . Mental retardation Neg Hx   . Miscarriages / Stillbirths Neg Hx   . Stroke Neg Hx   . Vision loss Neg Hx   . Varicose Veins Neg Hx     Social History   Tobacco Use  . Smoking status: Current Every Day Smoker    Types: Cigars  . Smokeless tobacco: Never Used  Substance Use Topics  . Alcohol use: Yes    Comment: occ  . Drug use: No    Home Medications Prior to Admission medications   Medication Sig Start Date End Date Taking? Authorizing Provider  ferrous sulfate (FERROUSUL) 325 (65 FE) MG tablet Take 1 tablet (325 mg total) by mouth  2 (two) times daily. 10/12/19   Constant, Peggy, MD  ibuprofen (ADVIL) 600 MG tablet Take 1 tablet (600 mg total) by mouth every 6 (six) hours. 12/06/19   Leftwich-Kirby, Wilmer Floor, CNM  Prenatal Vit-Fe Fumarate-FA (PRENATAL VITAMIN PO) Take 1 tablet by mouth daily.     [provider]  tinidazole (TINDAMAX) 500 MG tablet Take 4 tablets (2,000 mg total) by mouth daily with breakfast. For two days 02/29/20   Sharyon Cable, CNM  tinidazole [facility]) 500 MG tablet Take 2 tablets (1,000 mg total) by mouth daily with breakfast. 05/10/20   Brock Bad, MD    Allergies    Patient has no known allergies.  Review of Systems   Review of Systems Ten systems are reviewed and are negative for acute change except as noted in the HPI  Physical Exam Updated Vital Signs BP 109/78 (BP Location: Right Arm)   Pulse (!) 104   Temp 99.6 F (37.6 C) (Oral)   Resp 16   Ht 5\' 2"  (1.575 m)   Wt 56.2 kg   SpO2 100%   BMI 22.68 kg/m   Physical Exam Constitutional:      General: She is not in acute distress.    Appearance:  Normal appearance. She is well-developed. She is not ill-appearing or diaphoretic.  HENT:     Head: Normocephalic and atraumatic.     Jaw: There is normal jaw occlusion. No trismus.     Right Ear: Tympanic membrane and external ear normal.     Left Ear: Tympanic membrane and external ear normal.     Nose: Rhinorrhea present. Rhinorrhea is clear.     Mouth/Throat:     Comments: The patient has normal phonation and is in control of secretions. No stridor.  Midline uvula without edema. Soft palate rises symmetrically. No tonsillar erythema, swelling or exudates. Tongue protrusion is normal, floor of mouth is soft. No trismus. No creptius on neck palpation. No gingival erythema or fluctuance noted. Mucus membranes moist. No pallor noted. Eyes:     General: Vision grossly intact. Gaze aligned appropriately.     Extraocular Movements: Extraocular movements intact.     Conjunctiva/sclera: Conjunctivae normal.     Pupils: Pupils are equal, round, and reactive to light.  Neck:     Trachea: Trachea and phonation normal. No tracheal tenderness or tracheal deviation.     Meningeal: Brudzinski's sign absent.  Cardiovascular:     Rate and Rhythm: Normal rate and regular rhythm.  Pulmonary:     Effort: Pulmonary effort is normal. No accessory muscle usage or respiratory distress.     Breath sounds: Normal breath sounds and air entry.  Abdominal:     General: There is no distension.     Palpations: Abdomen is soft.     Tenderness: There is no abdominal tenderness. There is no guarding or rebound.  Musculoskeletal:        General: Normal range of motion.     Cervical back: Full passive range of motion without pain, normal range of motion and neck supple.     Right lower leg: No edema.     Left lower leg: No edema.  Skin:    General: Skin is warm and dry.  Neurological:     Mental Status: She is alert.     GCS: GCS eye subscore is 4. GCS verbal subscore is 5. GCS motor subscore is 6.     Comments:  Speech is clear and goal oriented, follows  commands Major Cranial nerves without deficit, no facial droop Moves extremities without ataxia, coordination intact  Psychiatric:        Behavior: Behavior normal.     ED Results / Procedures / Treatments   Labs (all labs ordered are listed, but only abnormal results are displayed) Labs Reviewed  SARS CORONAVIRUS 2 BY RT PCR [facility] ORDER, PERFORMED IN [facility] LAB)  PREGNANCY, URINE    EKG EKG Interpretation  Date/Time:  Wednesday May 11 2020 14:00:47 EDT Ventricular Rate:  112 PR Interval:  128 QRS Duration: 66 QT Interval:  286 QTC Calculation: 390 R Axis:   89 Text Interpretation: Sinus tachycardia Anterior infarct , age undetermined Abnormal ECG Confirmed by Tilden Fossa [phone]) on 05/11/2020 2:17:38 PM   Radiology DG Chest Portable 1 View  Result Date: 05/11/2020 CLINICAL DATA:  Cough, fever EXAM: PORTABLE CHEST 1 VIEW COMPARISON:  None. FINDINGS: The heart size and mediastinal contours are within normal limits. Both lungs are clear. The visualized skeletal structures are unremarkable. IMPRESSION: No acute abnormality of the lungs in AP portable projection. Electronically Signed   By: Lauralyn Primes M.D.   On: 05/11/2020 14:11    Procedures Procedures (including critical care time)  Medications Ordered in ED Medications  acetaminophen (TYLENOL) tablet 650 mg (650 mg Oral Given 05/11/20 1404)    ED Course  I have reviewed the triage vital signs and the nursing notes.  Pertinent labs & imaging results that were available during my care of the patient were reviewed by me and considered in my medical decision making (see chart for details).  Kari [name] was evaluated in Emergency Department on 05/11/2020 for the symptoms described in the history of present illness. She was evaluated in the context of the global COVID-19 pandemic, which necessitated consideration that the patient might be at risk for  infection with the SARS-CoV-2 virus that causes COVID-19. Institutional protocols and algorithms that pertain to the evaluation of patients at risk for COVID-19 are in a state of rapid change based on information released by regulatory bodies including the CDC and federal and state organizations. These policies and algorithms were followed during the patient's care in the ED.   MDM Rules/Calculators/A&P                     24 year old female arrives with 2-3-day history of fever and nonproductive cough.  On arrival she is well-appearing and in no acute distress, cranial nerves intact, no meningeal signs, airway clear, tachycardic, lungs clear, abdomen soft nontender, neurovascular tact to all 4 extremities without evidence of DVT.  Suspect patient's tachycardia is likely due to viral illness.  A repeat temperature shows fever of 100.7 F.  I had ordered labs including CBC, CMP, lactic, pregnancy test, Covid test in addition to chest x-ray and EKG.  Patient is not septic appearing or toxic, she is well-appearing and in no acute distress these were screening labs.  I reassessed the patient she is refusing any blood work today reports she is feeling well, shared decision making was made with the patient, baseline patient's appearance feels is reasonable to forego labs at this time I suspect for viral illness.  - Urine pregnancy test negative Covid test negative Chest x-ray:  IMPRESSION:  No acute abnormality of the lungs in AP portable projection.  I have personally reviewed patient's chest x-ray and agree with radiologist interpretation.  EKG: Sinus tachycardia Anterior infarct , age undetermined Abnormal ECG Confirmed by Tilden Fossa [phone])  on 05/11/2020 2:17:38 PM - Patient was given 650 mg Tylenol, both her fever and tachycardia have responded to this.  Suspect patient with viral upper respiratory tract infection with cough, no evidence of bacterial infection requiring antibiotics.  Additionally  she has no chest pain or shortness of breath, doubt pulmonary embolism, ACS, dissection or other emergent cardiopulmonary processes at this time.  She is requesting discharge.  On reassessment she is nontoxic well-appearing no acute distress.   At this time there does not appear to be any evidence of an acute emergency medical condition and the patient appears stable for discharge with appropriate outpatient follow up. Diagnosis was discussed with patient who verbalizes understanding of care plan and is agreeable to discharge. I have discussed return precautions with patient and husband who verbalizes understanding. Patient encouraged to follow-up with their PCP. All questions answered.  Patient's case discussed with Dr. Ralene Bathe who agrees with plan to discharge with follow-up.   Note: Portions of this report may have been transcribed using voice recognition software. Every effort was made to ensure accuracy; however, inadvertent computerized transcription errors may still be present. Final Clinical Impression(s) / ED Diagnoses Final diagnoses:  Viral URI with cough    Rx / DC Orders ED Discharge Orders    None       Gari Crown 05/11/20 1640    Deliah Boston, PA-C 05/11/20 1642    Quintella Reichert, MD 05/12/20 [phone]

## 2020-05-11 NOTE — Telephone Encounter (Signed)
Called to advise of results and rx, no answer, left vm ?

## 2020-05-11 NOTE — ED Notes (Signed)
ED Provider at bedside. 

## 2020-05-19 ENCOUNTER — Ambulatory Visit: Payer: 59

## 2020-05-24 NOTE — Progress Notes (Signed)
Patient was assessed and managed by nursing staff during this encounter. I have reviewed the chart and agree with the documentation and plan. I have also made any necessary editorial changes.  Alp Goldwater, MD 05/24/2020 11:39 AM    

## 2020-07-06 ENCOUNTER — Emergency Department (HOSPITAL_BASED_OUTPATIENT_CLINIC_OR_DEPARTMENT_OTHER)
Admission: EM | Admit: 2020-07-06 | Discharge: 2020-07-06 | Discharge status: Against Medical Advice | Payer: 59 | Source: Home / Self Care

## 2020-07-06 ENCOUNTER — Other Ambulatory Visit: Payer: Self-pay

## 2020-07-06 ENCOUNTER — Encounter (HOSPITAL_BASED_OUTPATIENT_CLINIC_OR_DEPARTMENT_OTHER): Payer: Self-pay

## 2020-07-06 ENCOUNTER — Emergency Department (HOSPITAL_BASED_OUTPATIENT_CLINIC_OR_DEPARTMENT_OTHER)
Admission: EM | Admit: 2020-07-06 | Discharge: 2020-07-06 | Disposition: A | Discharge status: Home / Self Care | Payer: 59 | Attending: Emergency Medicine | Admitting: Emergency Medicine

## 2020-07-06 DIAGNOSIS — Z5321 Procedure and treatment not carried out due to patient leaving prior to being seen by health care provider: Secondary | ICD-10-CM | POA: Insufficient documentation

## 2020-07-06 DIAGNOSIS — N939 Abnormal uterine and vaginal bleeding, unspecified: Secondary | ICD-10-CM | POA: Insufficient documentation

## 2020-07-06 LAB — PREGNANCY, URINE: Preg Test, Ur: NEGATIVE

## 2020-07-06 NOTE — ED Triage Notes (Signed)
Pt c/o vaginal bleeding since depo injection 11/2019-state she had "blood clots yesterday"-has not sought medical treatment-NAD-steady gait

## 2020-07-06 NOTE — ED Triage Notes (Signed)
Pt c/o vaginal bleeding (see triage note from earlier) pt returned after leaving to pick up child from daycare-pt NAD-steady gait

## 2020-07-06 NOTE — ED Notes (Signed)
Attempted to call multiple times in lobby and out front door with no response.

## 2020-07-07 ENCOUNTER — Other Ambulatory Visit (HOSPITAL_COMMUNITY)
Admission: RE | Admit: 2020-07-07 | Discharge: 2020-07-07 | Disposition: A | Discharge status: Home / Self Care | Payer: 59 | Source: Ambulatory Visit

## 2020-07-07 ENCOUNTER — Ambulatory Visit (INDEPENDENT_AMBULATORY_CARE_PROVIDER_SITE_OTHER): Payer: 59

## 2020-07-07 VITALS — BP 105/68 | HR 88 | Wt 120.0 lb

## 2020-07-07 DIAGNOSIS — N898 Other specified noninflammatory disorders of vagina: Secondary | ICD-10-CM | POA: Diagnosis present

## 2020-07-07 DIAGNOSIS — Z3009 Encounter for other general counseling and advice on contraception: Secondary | ICD-10-CM | POA: Diagnosis present

## 2020-07-07 DIAGNOSIS — Z3202 Encounter for pregnancy test, result negative: Secondary | ICD-10-CM

## 2020-07-07 DIAGNOSIS — Z113 Encounter for screening for infections with a predominantly sexual mode of transmission: Secondary | ICD-10-CM | POA: Diagnosis present

## 2020-07-07 LAB — POCT URINE PREGNANCY: Preg Test, Ur: NEGATIVE

## 2020-07-07 NOTE — Patient Instructions (Signed)
Contraception Choices Contraception, also called birth control, refers to methods or devices that prevent pregnancy. Hormonal methods Contraceptive implant  A contraceptive implant is a thin, plastic tube that contains a hormone. It is inserted into the upper part of the arm. It can remain in place for up to 3 years. Progestin-only injections Progestin-only injections are injections of progestin, a synthetic form of the hormone progesterone. They are given every 3 months by a health care provider. Birth control pills  Birth control pills are pills that contain hormones that prevent pregnancy. They must be taken once a day, preferably at the same time each day. Birth control patch  The birth control patch contains hormones that prevent pregnancy. It is placed on the skin and must be changed once a week for three weeks and removed on the fourth week. A prescription is needed to use this method of contraception. Vaginal ring  A vaginal ring contains hormones that prevent pregnancy. It is placed in the vagina for three weeks and removed on the fourth week. After that, the process is repeated with a new ring. A prescription is needed to use this method of contraception. Emergency contraceptive Emergency contraceptives prevent pregnancy after unprotected sex. They come in pill form and can be taken up to 5 days after sex. They work best the sooner they are taken after having sex. Most emergency contraceptives are available without a prescription. This method should not be used as your only form of birth control. Barrier methods Female condom  A female condom is a thin sheath that is worn over the penis during sex. Condoms keep sperm from going inside a woman's body. They can be used with a spermicide to increase their effectiveness. They should be disposed after a single use. Female condom  A female condom is a soft, loose-fitting sheath that is put into the vagina before sex. The condom keeps sperm  from going inside a woman's body. They should be disposed after a single use. Diaphragm  A diaphragm is a soft, dome-shaped barrier. It is inserted into the vagina before sex, along with a spermicide. The diaphragm blocks sperm from entering the uterus, and the spermicide kills sperm. A diaphragm should be left in the vagina for 6-8 hours after sex and removed within 24 hours. A diaphragm is prescribed and fitted by a health care provider. A diaphragm should be replaced every 1-2 years, after giving birth, after gaining more than 15 lb (6.8 kg), and after pelvic surgery. Cervical cap  A cervical cap is a round, soft latex or plastic cup that fits over the cervix. It is inserted into the vagina before sex, along with spermicide. It blocks sperm from entering the uterus. The cap should be left in place for 6-8 hours after sex and removed within 48 hours. A cervical cap must be prescribed and fitted by a health care provider. It should be replaced every 2 years. Sponge  A sponge is a soft, circular piece of polyurethane foam with spermicide on it. The sponge helps block sperm from entering the uterus, and the spermicide kills sperm. To use it, you make it wet and then insert it into the vagina. It should be inserted before sex, left in for at least 6 hours after sex, and removed and thrown away within 30 hours. Spermicides Spermicides are chemicals that kill or block sperm from entering the cervix and uterus. They can come as a cream, jelly, suppository, foam, or tablet. A spermicide should be inserted into the   vagina with an applicator at least 10-15 minutes before sex to allow time for it to work. The process must be repeated every time you have sex. Spermicides do not require a prescription. Intrauterine contraception Intrauterine device (IUD) An IUD is a T-shaped device that is put in a woman's uterus. There are two types:  Hormone IUD.This type contains progestin, a synthetic form of the hormone  progesterone. This type can stay in place for 3-5 years.  Copper IUD.This type is wrapped in copper wire. It can stay in place for 10 years.  Permanent methods of contraception Female tubal ligation In this method, a woman's fallopian tubes are sealed, tied, or blocked during surgery to prevent eggs from traveling to the uterus. Hysteroscopic sterilization In this method, a small, flexible insert is placed into each fallopian tube. The inserts cause scar tissue to form in the fallopian tubes and block them, so sperm cannot reach an egg. The procedure takes about 3 months to be effective. Another form of birth control must be used during those 3 months. Female sterilization This is a procedure to tie off the tubes that carry sperm (vasectomy). After the procedure, the man can still ejaculate fluid (semen). Natural planning methods Natural family planning In this method, a couple does not have sex on days when the woman could become pregnant. Calendar method This means keeping track of the length of each menstrual cycle, identifying the days when pregnancy can happen, and not having sex on those days. Ovulation method In this method, a couple avoids sex during ovulation. Symptothermal method This method involves not having sex during ovulation. The woman typically checks for ovulation by watching changes in her temperature and in the consistency of cervical mucus. Post-ovulation method In this method, a couple waits to have sex until after ovulation. Summary  Contraception, also called birth control, means methods or devices that prevent pregnancy.  Hormonal methods of contraception include implants, injections, pills, patches, vaginal rings, and emergency contraceptives.  Barrier methods of contraception can include female condoms, female condoms, diaphragms, cervical caps, sponges, and spermicides.  There are two types of IUDs (intrauterine devices). An IUD can be put in a woman's uterus to  prevent pregnancy for 3-5 years.  Permanent sterilization can be done through a procedure for males, females, or both.  Natural family planning methods involve not having sex on days when the woman could become pregnant. This information is not intended to replace advice given to you by your health care provider. Make sure you discuss any questions you have with your health care provider. Document Revised: 12/12/2017 Document Reviewed: 01/12/2017 Elsevier Patient Education  2020 Elsevier Inc.  

## 2020-07-07 NOTE — Progress Notes (Signed)
GYNECOLOGY PROBLEM OFFICE VISIT NOTE  History:  Kari [name] is a 24 y.o. [phone] here today for birth control counseling and vaginal discharge.  Patient reports she has been "bleeding super heavy" for the past 2 days despite discontinuing her Depo Provera in March.  Patient reports she has had cramping and clots that are as large as golf balls, but not frequently.  Patient also reports onset of green discharge and irritation about one week ago.  Patient endorses some pain and discomfort with sexual activity.   Kari [name] reports she has been engaging in sexual activity without condom usage, but reports "always" to usage of the withdrawal method.  She has no desire to have more children, is okay with having a menstrual cycle monthly, and endorses a history of anxiety and depression Past Medical History:  Diagnosis Date  . Anemia   . Anxiety   . Depression   . Headache    migrains  . IUFD (intrauterine fetal death) 04/24/15    Past Surgical History:  Procedure Laterality Date  . APPENDECTOMY    . HERNIA REPAIR      The following portions of the patient's history were reviewed and updated as appropriate: allergies, current medications, past family history, past medical history, past social history, past surgical history and problem list.   Health Maintenance:  Normal pap Jan 16, 2018.  No mammogram d/t age.   Review of Systems:  Genito-Urinary ROS: no dysuria, trouble voiding, or hematuria   Objective:  Vitals: BP 105/68   Pulse 88   Wt 120 lb (54.4 kg)   BMI 21.95 kg/m   Physical Exam: Physical Exam Constitutional:      Appearance: Normal appearance.  Genitourinary:     Vaginal bleeding present.     No vaginal discharge.     No cervical motion tenderness, friability, lesion or polyp.     Uterus is not enlarged or tender.     Genitourinary Comments: Speculum Exam: -Normal External Genitalia: Non tender, Blood noted at introitus.  -Vaginal Vault: Pink mucosa  with good rugae. Small amt blood in vault removed with faux swab x 1.  No apparent discharge -CV collected -Cervix:Pink, no lesions, cysts, or polyps.  Appears closed. No active bleeding from os. -Bimanual Exam:  No tenderness, appropriate size.    HENT:     Head: Normocephalic and atraumatic.  Eyes:     Conjunctiva/sclera: Conjunctivae normal.  Cardiovascular:     Rate and Rhythm: Normal rate and regular rhythm.     Heart sounds: Normal heart sounds.  Pulmonary:     Effort: Pulmonary effort is normal. No respiratory distress.     Breath sounds: Normal breath sounds.  Abdominal:     General: Abdomen is flat. Bowel sounds are normal. There is no distension.     Palpations: Abdomen is soft.     Tenderness: There is no abdominal tenderness.  Musculoskeletal:        General: Normal range of motion.     Cervical back: Normal range of motion.  Neurological:     Mental Status: She is alert and oriented to person, place, and time.  Skin:    General: Skin is warm and dry.  Psychiatric:        Mood and Affect: Mood normal.        Behavior: Behavior normal.        Thought Content: Thought content normal.      Labs and Imaging: No results found.  Assessment &  Plan:  24 year old [phone] Encounter for counseling regarding contraception Screening examination for STD (sexually transmitted disease) Vaginal discharge  -Reviewed patient desires and exceptions regarding birth control methods. -Discussed methods by tiered approach, risks, and effectiveness. -Patient opts to strongly consider hormonal IUD-given [place] and Mirena pamphlets.  -Exam performed and findings discussed. -Will await results of CV and treat appropriately. -Encouraged to return for insertion of IUD when/if desired. -Message sent to patient via mychart (7/16) reminding patient to abstain from sexual activity prior to IUD insertion appt.   Total face-to-face time with patient: 25 minutes   Kari [name],  [place] 07/07/2020 2:25 PM

## 2020-07-07 NOTE — Progress Notes (Signed)
Pt c/o heavy VB changing pads every 2 hours Depo was due in Mar Pt does not like Depo reqs differ BC unsure which one Pt requests all STD testing today. Reports green vaginal discharge

## 2020-07-08 LAB — CERVICOVAGINAL ANCILLARY ONLY
Bacterial Vaginitis (gardnerella): POSITIVE — AB
Candida Glabrata: NEGATIVE
Candida Vaginitis: POSITIVE — AB
Chlamydia: NEGATIVE
Comment: NEGATIVE
Comment: NEGATIVE
Comment: NEGATIVE
Comment: NEGATIVE
Comment: NEGATIVE
Comment: NORMAL
Neisseria Gonorrhea: NEGATIVE
Trichomonas: NEGATIVE

## 2020-07-08 LAB — HEPATITIS B SURFACE ANTIGEN: Hepatitis B Surface Ag: NEGATIVE

## 2020-07-08 LAB — HIV ANTIBODY (ROUTINE TESTING W REFLEX): HIV Screen 4th Generation wRfx: NONREACTIVE

## 2020-07-08 LAB — RPR: RPR Ser Ql: NONREACTIVE

## 2020-07-08 LAB — HEPATITIS C ANTIBODY: Hep C Virus Ab: <0.1 s/co ratio (ref 0.0–0.9)

## 2020-07-09 MED ORDER — FLUCONAZOLE 150 MG PO TABS
150.0000 mg | ORAL_TABLET | Freq: Every day | ORAL | 0 refills | Status: DC
Start: 2020-07-09 — End: 2021-02-09

## 2020-07-09 NOTE — Addendum Note (Signed)
Addended by: Gerrit Heck L on: 07/09/2020 04:09 AM   Modules accepted: Orders

## 2020-07-25 ENCOUNTER — Ambulatory Visit: Payer: 59 | Admitting: Obstetrics and Gynecology

## 2020-09-22 ENCOUNTER — Ambulatory Visit: Payer: 59 | Admitting: Obstetrics & Gynecology

## 2020-10-06 ENCOUNTER — Other Ambulatory Visit: Payer: Self-pay

## 2020-10-06 ENCOUNTER — Ambulatory Visit (INDEPENDENT_AMBULATORY_CARE_PROVIDER_SITE_OTHER): Payer: 59

## 2020-10-06 VITALS — BP 98/63 | HR 77 | Wt 111.0 lb

## 2020-10-06 DIAGNOSIS — N938 Other specified abnormal uterine and vaginal bleeding: Secondary | ICD-10-CM | POA: Diagnosis not present

## 2020-10-06 DIAGNOSIS — N939 Abnormal uterine and vaginal bleeding, unspecified: Secondary | ICD-10-CM

## 2020-10-06 DIAGNOSIS — Z3042 Encounter for surveillance of injectable contraceptive: Secondary | ICD-10-CM | POA: Diagnosis not present

## 2020-10-06 LAB — POCT URINE PREGNANCY: Preg Test, Ur: NEGATIVE

## 2020-10-06 MED ORDER — ESTRADIOL 0.1 MG/24HR TD PTWK
0.1000 mg | MEDICATED_PATCH | TRANSDERMAL | 0 refills | Status: DC
Start: 2020-10-06 — End: 2022-12-14

## 2020-10-06 MED ORDER — MEDROXYPROGESTERONE ACETATE 150 MG/ML IM SUSP
150.0000 mg | Freq: Once | INTRAMUSCULAR | Status: AC
  Administered 2020-10-06: 150 mg via INTRAMUSCULAR

## 2020-10-06 NOTE — Progress Notes (Signed)
Pt presents for another birth control consult.  Pt wants to restart Depo  Last IC 2 weeks ago Menses is heavy started yesterday  Normal pap 01/16/2018 Office supply Depo given R del w/o complaints

## 2020-10-07 NOTE — Progress Notes (Signed)
   GYNECOLOGY PROBLEM OFFICE VISIT NOTE  History:  Kari [name] is a 24 y.o.  [phone] here today for depo provera restart. She reports she discontinued her Depo Provera in March for abnormal bleeding, but has continued to have bleeding since.  She reports the bleeding varies in nature from light spotting to heavy flow with passing of clots.  She reports current menses and sexual activity 2 weeks ago without condom usage.  Patient states that she has not been engaging in frequent sexual activity out of fear of pregnancy.  Patient goes on to report that she does not desire more children and would like to have a BTL.     Past Medical History:  Diagnosis Date  . Anemia   . Anxiety   . Depression   . Headache    migrains  . IUFD (intrauterine fetal death) 04/06/2015    Past Surgical History:  Procedure Laterality Date  . APPENDECTOMY    . HERNIA REPAIR      The following portions of the patient's history were reviewed and updated as appropriate: allergies, current medications, past family history, past medical history, past social history, past surgical history and problem list.   Health Maintenance:  Normal pap and negative HRHPV on Jan 16, 2018.  No mammogram on file d/t age  Review of Systems:  Genito-Urinary ROS: no dysuria, trouble voiding, or hematuria Gastrointestinal ROS: no abdominal pain, change in bowel habits, or black or bloody stools  Objective:  Vitals: BP 98/63   Pulse 77   Wt 111 lb (50.3 kg)   LMP 10/05/2020   BMI 20.30 kg/m   Physical Exam: Physical Exam Constitutional:      Appearance: Normal appearance.  HENT:     Head: Normocephalic and atraumatic.  Eyes:     Conjunctiva/sclera: Conjunctivae normal.  Cardiovascular:     Rate and Rhythm: Normal rate and regular rhythm.  Pulmonary:     Effort: Pulmonary effort is normal. No respiratory distress.  Musculoskeletal:        General: Normal range of motion.     Cervical back: Normal range of motion.   Neurological:     Mental Status: She is alert and oriented to person, place, and time.  Skin:    General: Skin is warm and dry.  Psychiatric:        Mood and Affect: Mood normal.        Behavior: Behavior normal.        Thought Content: Thought content normal.  Vitals reviewed.      Labs and Imaging: No results found.  Assessment & Plan:  24 year old Pap UTD Abnormal Uterine Bleeding Desires Depo Provera Restart  -Reviewed contraception methods by tiered approach. -Discussed r/b of IUD usage for bleeding mgmt as well as birth control. -Briefly discussed permanent sterilization. Encouraged to consider future before making permanent fertility decisions. -Reviewed bleeding patterns and informed of continued abnormal bleeding in setting of Depo restart. -Will give estrogen patches x 3 months for bleeding mgmt. -Patient given pamphlets for Nexplanon and IUD for review. -Plan to RTO in 3 months for repeat depo dosing or change in method for improved bleeding mgmt.    Total face-to-face time with patient: 15 minutes   Gerrit Heck, CNM 10/07/2020 7:05 AM

## 2020-12-22 ENCOUNTER — Other Ambulatory Visit (HOSPITAL_COMMUNITY)
Admission: RE | Admit: 2020-12-22 | Discharge: 2020-12-22 | Disposition: A | Discharge status: Home / Self Care | Payer: 59 | Source: Ambulatory Visit | Attending: Obstetrics | Admitting: Obstetrics

## 2020-12-22 ENCOUNTER — Other Ambulatory Visit: Payer: Self-pay

## 2020-12-22 ENCOUNTER — Ambulatory Visit: Payer: 59

## 2020-12-22 DIAGNOSIS — N898 Other specified noninflammatory disorders of vagina: Secondary | ICD-10-CM | POA: Insufficient documentation

## 2020-12-22 NOTE — Progress Notes (Signed)
.  SUBJECTIVE:  24 y.o. female complains of thin vaginal discharge. Denies abnormal vaginal bleeding or significant pelvic pain or fever. No UTI symptoms. Denies history of known exposure to STD.  No LMP recorded. (Menstrual status: Irregular Periods).  OBJECTIVE:  She appears well, afebrile. Urine dipstick: not done.  ASSESSMENT:  Vaginal Discharge  Vaginal Odor   PLAN:  GC, chlamydia, trichomonas, BVAG, CVAG probe sent to lab. Treatment: To be determined once lab results are received ROV prn if symptoms persist or worsen.

## 2020-12-26 LAB — CERVICOVAGINAL ANCILLARY ONLY
Bacterial Vaginitis (gardnerella): POSITIVE — AB
Candida Glabrata: NEGATIVE
Candida Vaginitis: NEGATIVE
Chlamydia: NEGATIVE
Comment: NEGATIVE
Comment: NEGATIVE
Comment: NEGATIVE
Comment: NEGATIVE
Comment: NEGATIVE
Comment: NORMAL
Neisseria Gonorrhea: NEGATIVE
Trichomonas: NEGATIVE

## 2020-12-29 ENCOUNTER — Other Ambulatory Visit: Payer: Self-pay

## 2020-12-29 MED ORDER — METRONIDAZOLE 500 MG PO TABS
500.0000 mg | ORAL_TABLET | Freq: Two times a day (BID) | ORAL | 0 refills | Status: DC
Start: 2020-12-29 — End: 2021-02-09

## 2021-01-09 ENCOUNTER — Ambulatory Visit: Payer: 59

## 2021-01-10 ENCOUNTER — Ambulatory Visit: Payer: 59

## 2021-01-24 ENCOUNTER — Ambulatory Visit (INDEPENDENT_AMBULATORY_CARE_PROVIDER_SITE_OTHER): Payer: 59

## 2021-01-24 ENCOUNTER — Other Ambulatory Visit: Payer: Self-pay

## 2021-01-24 VITALS — Wt 119.0 lb

## 2021-01-24 DIAGNOSIS — Z3202 Encounter for pregnancy test, result negative: Secondary | ICD-10-CM

## 2021-01-24 LAB — POCT URINE PREGNANCY: Preg Test, Ur: NEGATIVE

## 2021-01-24 NOTE — Progress Notes (Addendum)
SUBJECTIVE GYN presents for 1st. UPT to restart DEPO. Patient is outside of the 2 weeks window and had unprotected sex last week.  ASSESSMENT & PLAN: UPT today is NEGATIVE Last PAP 01/16/2018.  Needs to schedule and keep appt. For Annual Exam. Abstain from SI for the next two weeks, pick up Rx and take to appointment in 2 weeks for 2nd UPT/DEPO restart. Patient verbalized understanding and agreement.

## 2021-01-26 ENCOUNTER — Encounter: Payer: Self-pay | Admitting: Obstetrics

## 2021-01-26 ENCOUNTER — Ambulatory Visit (INDEPENDENT_AMBULATORY_CARE_PROVIDER_SITE_OTHER): Payer: 59 | Admitting: Obstetrics

## 2021-01-26 ENCOUNTER — Other Ambulatory Visit: Payer: Self-pay

## 2021-01-26 VITALS — BP 118/68 | HR 94 | Ht 62.0 in | Wt 118.7 lb

## 2021-01-26 DIAGNOSIS — Z3042 Encounter for surveillance of injectable contraceptive: Secondary | ICD-10-CM

## 2021-01-26 LAB — POCT URINE PREGNANCY: Preg Test, Ur: NEGATIVE

## 2021-01-26 MED ORDER — MEDROXYPROGESTERONE ACETATE 150 MG/ML IM SUSP
150.0000 mg | Freq: Once | INTRAMUSCULAR | Status: AC
  Administered 2021-01-26: 150 mg via INTRAMUSCULAR

## 2021-01-26 NOTE — Progress Notes (Signed)
Pt came for depo and annual but is currently on menstrual cycle. Pt had negative upt today, provider approved depo to be administered today. Pt will come back next week for annual.  Patient was assessed and managed by nursing staff during this encounter. I have reviewed the chart and agree with the documentation and plan. I have also made any necessary editorial changes.  Coral Ceo, MD 01/26/2021 4:28 PM

## 2021-02-07 ENCOUNTER — Ambulatory Visit: Payer: 59

## 2021-02-09 ENCOUNTER — Ambulatory Visit (INDEPENDENT_AMBULATORY_CARE_PROVIDER_SITE_OTHER): Payer: 59 | Admitting: Obstetrics

## 2021-02-09 ENCOUNTER — Other Ambulatory Visit: Payer: Self-pay

## 2021-02-09 ENCOUNTER — Encounter: Payer: Self-pay | Admitting: Obstetrics

## 2021-02-09 ENCOUNTER — Other Ambulatory Visit (HOSPITAL_COMMUNITY)
Admission: RE | Admit: 2021-02-09 | Discharge: 2021-02-09 | Disposition: A | Discharge status: Home / Self Care | Payer: 59 | Source: Ambulatory Visit | Attending: Obstetrics | Admitting: Obstetrics

## 2021-02-09 VITALS — BP 100/62 | HR 82 | Ht 62.0 in | Wt 120.0 lb

## 2021-02-09 DIAGNOSIS — N898 Other specified noninflammatory disorders of vagina: Secondary | ICD-10-CM

## 2021-02-09 DIAGNOSIS — Z3042 Encounter for surveillance of injectable contraceptive: Secondary | ICD-10-CM | POA: Diagnosis not present

## 2021-02-09 DIAGNOSIS — Z113 Encounter for screening for infections with a predominantly sexual mode of transmission: Secondary | ICD-10-CM

## 2021-02-09 DIAGNOSIS — Z01419 Encounter for gynecological examination (general) (routine) without abnormal findings: Secondary | ICD-10-CM

## 2021-02-09 DIAGNOSIS — F17200 Nicotine dependence, unspecified, uncomplicated: Secondary | ICD-10-CM

## 2021-02-09 MED ORDER — MEDROXYPROGESTERONE ACETATE 150 MG/ML IM SUSP: 150.0000 mg | INTRAMUSCULAR | 4 refills | Status: DC

## 2021-02-09 NOTE — Progress Notes (Signed)
Subjective:        Kari [name] is a 25 y.o. female here for a routine exam.  Current complaints: Vaginal discharge.    Personal health questionnaire:  Is patient Kari [name], have a family history of breast and/or ovarian cancer: no Is there a family history of uterine cancer diagnosed at age < 44, gastrointestinal cancer, urinary tract cancer, family member who is a Personnel officer syndrome-associated carrier: no Is the patient overweight and hypertensive, family history of diabetes, personal history of gestational diabetes, preeclampsia or PCOS: no Is patient over 59, have PCOS,  family history of premature CHD under age 68, diabetes, smoke, have hypertension or peripheral artery disease:  no At any time, has a partner hit, kicked or otherwise hurt or frightened you?: no Over the past 2 weeks, have you felt down, depressed or hopeless?: no Over the past 2 weeks, have you felt little interest or pleasure in doing things?:no   Gynecologic History Patient's last menstrual period was 01/23/2021. Contraception: Depo-Provera injections Last Pap: 01-16-2018. Results were: normal Last mammogram: n/a. Results were: n/a  Obstetric History OB History  Gravida Para Term Preterm AB Living  5 4 3 1 1 3   SAB IAB Ectopic Multiple Live Births  1     0 3    # Outcome Date GA Lbr Len/2nd Weight Sex Delivery Anes PTL Lv  5 Term 12/04/19 [redacted]w[redacted]d 16:55 / 00:27 7 lb 11.8 oz (3.51 kg) M Vag-Spont EPI  LIV  4 Term 07/13/18 [redacted]w[redacted]d 07:44 / 00:16 8 lb 3.8 oz (3.735 kg) M Vag-Spont EPI  LIV  3 Term 02/27/17 [redacted]w[redacted]d 06:44 / 00:12 7 lb 7 oz (3.374 kg) M Vag-Spont EPI  LIV  2 SAB 01/2016          1 Preterm 10/08/15 [redacted]w[redacted]d 03:20 / 00:02 3 lb 0.9 oz (1.385 kg) M Vag-Spont EPI  FD     Birth Comments: none    Past Medical History:  Diagnosis Date  . Anemia   . Anxiety   . Depression   . Headache    migrains  . IUFD (intrauterine fetal death) 05/07/15    Past Surgical History:  Procedure Laterality Date   . APPENDECTOMY    . HERNIA REPAIR       Current Outpatient Medications:  .  medroxyPROGESTERone (DEPO-PROVERA) 150 MG/ML injection, Inject 1 mL (150 mg total) into the muscle every 3 (three) months., Disp: 1 mL, Rfl: 4 .  estradiol (CLIMARA) 0.1 mg/24hr patch, Place 1 patch (0.1 mg total) onto the skin once a week., Disp: 13 patch, Rfl: 0 .  ferrous sulfate (FERROUSUL) 325 (65 FE) MG tablet, Take 1 tablet (325 mg total) by mouth 2 (two) times daily., Disp: 60 tablet, Rfl: 1 .  Multiple Vitamin (MULTIVITAMIN) tablet, Take 1 tablet by mouth daily. (Patient not taking: Reported on 01/26/2021), Disp: , Rfl:  No Known Allergies  Social History   Tobacco Use  . Smoking status: Current Every Day Smoker    Types: Cigars  . Smokeless tobacco: Never Used  Substance Use Topics  . Alcohol use: Yes    Comment: occ    Family History  Problem Relation Age of Onset  . Alcohol abuse Neg Hx   . Arthritis Neg Hx   . Asthma Neg Hx   . Birth defects Neg Hx   . Cancer Neg Hx   . COPD Neg Hx   . Depression Neg Hx   . Diabetes Neg Hx   .  Drug abuse Neg Hx   . Early death Neg Hx   . Hearing loss Neg Hx   . Heart disease Neg Hx   . Hyperlipidemia Neg Hx   . Hypertension Neg Hx   . Kidney disease Neg Hx   . Learning disabilities Neg Hx   . Mental illness Neg Hx   . Mental retardation Neg Hx   . Miscarriages / Stillbirths Neg Hx   . Stroke Neg Hx   . Vision loss Neg Hx   . Varicose Veins Neg Hx       Review of Systems  Constitutional: negative for fatigue and weight loss Respiratory: negative for cough and wheezing Cardiovascular: negative for chest pain, fatigue and palpitations Gastrointestinal: negative for abdominal pain and change in bowel habits Musculoskeletal:negative for myalgias Neurological: negative for gait problems and tremors Behavioral/Psych: negative for abusive relationship, depression Endocrine: negative for temperature intolerance    Genitourinary:negative for  abnormal menstrual periods, genital lesions, hot flashes, sexual problems. Positive for vaginal discharge Integument/breast: negative for breast lump, breast tenderness, nipple discharge and skin lesion(s)    Objective:       BP 100/62   Pulse 82   Ht 5\' 2"  (1.575 m)   Wt 120 lb (54.4 kg)   LMP 01/23/2021   BMI 21.95 kg/m  General:   alert and no distress  Skin:   no rash or abnormalities  Lungs:   clear to auscultation bilaterally  Heart:   regular rate and rhythm, S1, S2 normal, no murmur, click, rub or gallop  Breasts:   normal without suspicious masses, skin or nipple changes or axillary nodes  Abdomen:  normal findings: no organomegaly, soft, non-tender and no hernia  Pelvis:  External genitalia: normal general appearance Urinary system: urethral meatus normal and bladder without fullness, nontender Vaginal: normal without tenderness, induration or masses Cervix: normal appearance Adnexa: normal bimanual exam Uterus: anteverted and non-tender, normal size   Lab Review Urine pregnancy test Labs reviewed yes Radiologic studies reviewed no  50% of 20 min visit spent on counseling and coordination of care.   Assessment:     1. Encounter for gynecological examination with Papanicolaou smear of cervix Rx: - Cytology - PAP( Kari [name])  2. Vaginal discharge Rx: - Cervicovaginal ancillary only( )  3. Screening for STD (sexually transmitted disease) Rx: - RPR+HBsAg+HCVAb+...  4. Encounter for surveillance of injectable contraceptive Rx: - medroxyPROGESTERone (DEPO-PROVERA) 150 MG/ML injection; Inject 1 mL (150 mg total) into the muscle every 3 (three) months.  Dispense: 1 mL; Refill: 4  5. Tobacco dependence - cessation recommended with the aid of medication and behavioral modification    Plan:    Education reviewed: calcium supplements, depression evaluation, low fat, low cholesterol diet, safe sex/STD prevention, self breast exams, smoking cessation  and weight bearing exercise. Contraception: Depo-Provera injections. Follow up in: 1 year.   Meds ordered this encounter  Medications  . medroxyPROGESTERone (DEPO-PROVERA) 150 MG/ML injection    Sig: Inject 1 mL (150 mg total) into the muscle every 3 (three) months.    Dispense:  1 mL    Refill:  4   Orders Placed This Encounter  Procedures  . RPR+HBsAg+HCVAb+...    01/25/2021, MD 02/09/2021 9:49 AM

## 2021-02-10 LAB — CYTOLOGY - PAP: Diagnosis: NEGATIVE

## 2021-02-10 LAB — CERVICOVAGINAL ANCILLARY ONLY
Bacterial Vaginitis (gardnerella): NEGATIVE
Candida Glabrata: NEGATIVE
Candida Vaginitis: NEGATIVE
Chlamydia: NEGATIVE
Comment: NEGATIVE
Comment: NEGATIVE
Comment: NEGATIVE
Comment: NEGATIVE
Comment: NEGATIVE
Comment: NORMAL
Neisseria Gonorrhea: NEGATIVE
Trichomonas: NEGATIVE

## 2021-02-10 LAB — RPR+HBSAG+HCVAB+...
HIV Screen 4th Generation wRfx: NONREACTIVE
Hep C Virus Ab: <0.1 s/co ratio (ref 0.0–0.9)
Hepatitis B Surface Ag: NEGATIVE
RPR Ser Ql: NONREACTIVE

## 2021-02-15 ENCOUNTER — Ambulatory Visit: Payer: 59 | Admitting: Obstetrics & Gynecology

## 2021-04-18 ENCOUNTER — Ambulatory Visit: Payer: 59

## 2021-04-19 ENCOUNTER — Other Ambulatory Visit: Payer: Self-pay

## 2021-04-19 ENCOUNTER — Ambulatory Visit (INDEPENDENT_AMBULATORY_CARE_PROVIDER_SITE_OTHER): Payer: 59

## 2021-04-19 ENCOUNTER — Other Ambulatory Visit (HOSPITAL_COMMUNITY)
Admission: RE | Admit: 2021-04-19 | Discharge: 2021-04-19 | Disposition: A | Discharge status: Home / Self Care | Payer: 59 | Source: Ambulatory Visit | Attending: Obstetrics and Gynecology | Admitting: Obstetrics and Gynecology

## 2021-04-19 DIAGNOSIS — Z3042 Encounter for surveillance of injectable contraceptive: Secondary | ICD-10-CM | POA: Diagnosis not present

## 2021-04-19 DIAGNOSIS — Z113 Encounter for screening for infections with a predominantly sexual mode of transmission: Secondary | ICD-10-CM | POA: Insufficient documentation

## 2021-04-19 DIAGNOSIS — N898 Other specified noninflammatory disorders of vagina: Secondary | ICD-10-CM | POA: Insufficient documentation

## 2021-04-19 MED ORDER — MEDROXYPROGESTERONE ACETATE 150 MG/ML IM SUSP
150.0000 mg | Freq: Once | INTRAMUSCULAR | Status: AC
  Administered 2021-04-19: 150 mg via INTRAMUSCULAR

## 2021-04-19 NOTE — Progress Notes (Addendum)
Subjective: Pt in for Depo Provera injection. She forgot to bring Depo rx. During visit, pt c/o malodorous vaginal discharge and requests vaginal STD testing.  Objective: Need for contraception.   Assessment: Office Depo given RD. Pt tolerated Depo injection.  Malodorous vaginal discharge  Plan: Next injection due July 13-27.   GC, CT, BV, yeast culture sent to the lab. Treatment will be determined once the provider reviews the lab results.   Administrations This Visit    medroxyPROGESTERone (DEPO-PROVERA) injection 150 mg    Admin Date 04/19/2021 Action Given Dose 150 mg Route Intramuscular Administered By Lewayne Bunting, CMA

## 2021-04-19 NOTE — Progress Notes (Signed)
Agree with A & P. 

## 2021-04-20 LAB — CERVICOVAGINAL ANCILLARY ONLY
Bacterial Vaginitis (gardnerella): POSITIVE — AB
Candida Glabrata: NEGATIVE
Candida Vaginitis: POSITIVE — AB
Chlamydia: NEGATIVE
Comment: NEGATIVE
Comment: NEGATIVE
Comment: NEGATIVE
Comment: NEGATIVE
Comment: NEGATIVE
Comment: NORMAL
Neisseria Gonorrhea: NEGATIVE
Trichomonas: NEGATIVE

## 2021-04-21 ENCOUNTER — Other Ambulatory Visit: Payer: Self-pay

## 2021-04-21 MED ORDER — FLUCONAZOLE 150 MG PO TABS: ORAL_TABLET | ORAL | 0 refills | Status: DC

## 2021-04-21 MED ORDER — METRONIDAZOLE 500 MG PO TABS: 500.0000 mg | ORAL_TABLET | Freq: Two times a day (BID) | ORAL | 0 refills | Status: DC

## 2021-04-21 NOTE — Telephone Encounter (Signed)
Patient tested positive for yeast and bv. Will treat per protocol. Patient reviewed results via my chart.

## 2021-07-05 ENCOUNTER — Ambulatory Visit (INDEPENDENT_AMBULATORY_CARE_PROVIDER_SITE_OTHER): Payer: 59

## 2021-07-05 ENCOUNTER — Other Ambulatory Visit (HOSPITAL_COMMUNITY)
Admission: RE | Admit: 2021-07-05 | Discharge: 2021-07-05 | Disposition: A | Discharge status: Home / Self Care | Payer: 59 | Source: Ambulatory Visit | Attending: Family Medicine | Admitting: Family Medicine

## 2021-07-05 ENCOUNTER — Other Ambulatory Visit: Payer: Self-pay

## 2021-07-05 VITALS — BP 107/68 | HR 78 | Ht 62.0 in | Wt 129.0 lb

## 2021-07-05 DIAGNOSIS — Z3042 Encounter for surveillance of injectable contraceptive: Secondary | ICD-10-CM

## 2021-07-05 DIAGNOSIS — N898 Other specified noninflammatory disorders of vagina: Secondary | ICD-10-CM | POA: Insufficient documentation

## 2021-07-05 MED ORDER — MEDROXYPROGESTERONE ACETATE 150 MG/ML IM SUSP
150.0000 mg | Freq: Once | INTRAMUSCULAR | Status: AC
  Administered 2021-07-05: 150 mg via INTRAMUSCULAR

## 2021-07-05 NOTE — Progress Notes (Signed)
Patient was assessed and managed by nursing staff during this encounter. I have reviewed the chart and agree with the documentation and plan. I have also made any necessary editorial changes.  Marylen Ponto, NP 07/05/2021 1:06 PM

## 2021-07-05 NOTE — Progress Notes (Signed)
SUBJECTIVE: Kari [name] is a 25 y.o. female who presents for DEPO Injection and also requested STD Screening.   OBJECTIVE: Appears well, in no apparent distress.  Vital signs are normal.    ASSESSMENT: Within window for DEPO BC. Last PAP 02/09/2021. Vaginal Discharge.  PLAN:  DEPO Injection given in RD, tolerated well.   Next DEPO due Sept. 28 - Oct. 12, 2022  Self swab probe done, GC, CT, Trich, BV, Vag sent to Lab.   Call or return to clinic prn if these symptoms worsen or fail to improve as anticipated.   Administrations This Visit     medroxyPROGESTERone (DEPO-PROVERA) injection 150 mg     Admin Date 07/05/2021 Action Given Dose 150 mg Route Intramuscular Administered By Maretta Bees, RMA

## 2021-07-06 LAB — CERVICOVAGINAL ANCILLARY ONLY
Bacterial Vaginitis (gardnerella): NEGATIVE
Candida Glabrata: NEGATIVE
Candida Vaginitis: NEGATIVE
Chlamydia: NEGATIVE
Comment: NEGATIVE
Comment: NEGATIVE
Comment: NEGATIVE
Comment: NEGATIVE
Comment: NEGATIVE
Comment: NORMAL
Neisseria Gonorrhea: NEGATIVE
Trichomonas: NEGATIVE

## 2021-09-22 ENCOUNTER — Ambulatory Visit: Payer: 59

## 2021-09-26 ENCOUNTER — Ambulatory Visit (INDEPENDENT_AMBULATORY_CARE_PROVIDER_SITE_OTHER): Payer: 59

## 2021-09-26 ENCOUNTER — Other Ambulatory Visit: Payer: Self-pay

## 2021-09-26 DIAGNOSIS — Z3042 Encounter for surveillance of injectable contraceptive: Secondary | ICD-10-CM

## 2021-09-26 MED ORDER — MEDROXYPROGESTERONE ACETATE 150 MG/ML IM SUSP
150.0000 mg | INTRAMUSCULAR | Status: DC
  Administered 2021-09-26 – 2022-03-12 (×3): 150 mg via INTRAMUSCULAR

## 2021-09-26 NOTE — Progress Notes (Signed)
Patient was assessed and managed by nursing staff during this encounter. I have reviewed the chart and agree with the documentation and plan. I have also made any necessary editorial changes.  Warden Fillers, MD 09/26/2021 4:42 PM

## 2021-09-26 NOTE — Progress Notes (Signed)
Pt is in the office for depo injection, administered in LD and pt tolerated well. Next due Dec 20- Jan 3 .. Administrations This Visit     medroxyPROGESTERone (DEPO-PROVERA) injection 150 mg     Admin Date 09/26/2021 Action Given Dose 150 mg Route Intramuscular Administered By Katrina Stack, RN

## 2021-12-19 ENCOUNTER — Ambulatory Visit (INDEPENDENT_AMBULATORY_CARE_PROVIDER_SITE_OTHER): Payer: 59 | Admitting: *Deleted

## 2021-12-19 ENCOUNTER — Other Ambulatory Visit (HOSPITAL_COMMUNITY)
Admission: RE | Admit: 2021-12-19 | Discharge: 2021-12-19 | Disposition: A | Discharge status: Home / Self Care | Payer: 59 | Source: Ambulatory Visit | Attending: Family Medicine | Admitting: Family Medicine

## 2021-12-19 ENCOUNTER — Other Ambulatory Visit: Payer: Self-pay

## 2021-12-19 DIAGNOSIS — Z3042 Encounter for surveillance of injectable contraceptive: Secondary | ICD-10-CM

## 2021-12-19 DIAGNOSIS — N898 Other specified noninflammatory disorders of vagina: Secondary | ICD-10-CM

## 2021-12-19 NOTE — Progress Notes (Addendum)
Date last pap: 02/09/21. Last Depo-Provera: 09/26/21. Depo-Provera 150 mg IM given by: S.Malen Gauze, CMA. Depo given in Left deltoid, pt tolerated well. Next appointment due 3/14-3/28/23.  Pt request vaginal self swab today for recent vaginal irritation.  Pt request full panel swab. Pt made aware results can be seen in Mychart. Pt advised will be called with any abnormal results and treat as needed.    Administrations This Visit     medroxyPROGESTERone (DEPO-PROVERA) injection 150 mg     Admin Date 12/19/2021 Action Given Dose 150 mg Route Intramuscular Administered By Lanney Gins, CMA

## 2021-12-19 NOTE — Progress Notes (Signed)
Patient seen and assessed by nursing staff.  Agree with documentation and plan.  

## 2021-12-20 LAB — CERVICOVAGINAL ANCILLARY ONLY
Bacterial Vaginitis (gardnerella): POSITIVE — AB
Candida Glabrata: NEGATIVE
Candida Vaginitis: NEGATIVE
Chlamydia: NEGATIVE
Comment: NEGATIVE
Comment: NEGATIVE
Comment: NEGATIVE
Comment: NEGATIVE
Comment: NEGATIVE
Comment: NORMAL
Neisseria Gonorrhea: NEGATIVE
Trichomonas: NEGATIVE

## 2021-12-20 MED ORDER — METRONIDAZOLE 500 MG PO TABS: 500.0000 mg | ORAL_TABLET | Freq: Two times a day (BID) | ORAL | 0 refills | Status: DC

## 2021-12-20 NOTE — Addendum Note (Signed)
Addended by: Reva Bores on: 12/20/2021 05:03 PM   Modules accepted: Orders

## 2022-03-12 ENCOUNTER — Ambulatory Visit (INDEPENDENT_AMBULATORY_CARE_PROVIDER_SITE_OTHER): Payer: 59

## 2022-03-12 ENCOUNTER — Other Ambulatory Visit: Payer: Self-pay

## 2022-03-12 ENCOUNTER — Other Ambulatory Visit (HOSPITAL_COMMUNITY)
Admission: RE | Admit: 2022-03-12 | Discharge: 2022-03-12 | Disposition: A | Discharge status: Home / Self Care | Payer: 59 | Source: Ambulatory Visit | Attending: Obstetrics and Gynecology | Admitting: Obstetrics and Gynecology

## 2022-03-12 DIAGNOSIS — N898 Other specified noninflammatory disorders of vagina: Secondary | ICD-10-CM | POA: Diagnosis present

## 2022-03-12 DIAGNOSIS — Z3042 Encounter for surveillance of injectable contraceptive: Secondary | ICD-10-CM

## 2022-03-12 DIAGNOSIS — Z113 Encounter for screening for infections with a predominantly sexual mode of transmission: Secondary | ICD-10-CM | POA: Insufficient documentation

## 2022-03-12 MED ORDER — MEDROXYPROGESTERONE ACETATE 150 MG/ML IM SUSP: 150.0000 mg | INTRAMUSCULAR | 0 refills | Status: DC

## 2022-03-12 NOTE — Progress Notes (Signed)
Subjective:  Pt in for Depo Provera injection. Pt reports vaginal discharge and requests vaginal STD testing only. ? ?Objective: Need for contraception. No unusual complaints.   ? ?Assessment: Depo given LD without difficulty. ? ?Plan:  Next injection due June 5-19.   ?Needs an annual for further refills  ?Treatment: Will offer treatment once results are reviewed by the provider  ? ?Administrations This Visit   ? ? medroxyPROGESTERone (DEPO-PROVERA) injection 150 mg   ? ? Admin Date ?03/12/2022 Action ?Given Dose ?150 mg Route ?Intramuscular Administered By ?Lewayne Bunting, CMA  ? ?  ?  ? ?  ?  ?

## 2022-03-12 NOTE — Progress Notes (Signed)
Patient has Depo appt today. She needs a rf. ?Last annual 01/2021. Will refill Depo today but she will need to schedule an annual prior to sending full year of refills, pt agrees to schedule at checkout. ?

## 2022-03-13 LAB — CERVICOVAGINAL ANCILLARY ONLY
Bacterial Vaginitis (gardnerella): POSITIVE — AB
Candida Glabrata: NEGATIVE
Candida Vaginitis: NEGATIVE
Chlamydia: NEGATIVE
Comment: NEGATIVE
Comment: NEGATIVE
Comment: NEGATIVE
Comment: NEGATIVE
Comment: NEGATIVE
Comment: NORMAL
Neisseria Gonorrhea: NEGATIVE
Trichomonas: NEGATIVE

## 2022-03-13 MED ORDER — METRONIDAZOLE 500 MG PO TABS: 500.0000 mg | ORAL_TABLET | Freq: Two times a day (BID) | ORAL | 0 refills | Status: DC

## 2022-03-13 NOTE — Addendum Note (Signed)
Addended by: Catalina Antigua on: 03/13/2022 12:09 PM ? ? Modules accepted: Orders ? ?

## 2022-04-24 ENCOUNTER — Ambulatory Visit: Payer: 59 | Admitting: Obstetrics and Gynecology

## 2022-05-30 ENCOUNTER — Other Ambulatory Visit: Payer: Self-pay | Admitting: Emergency Medicine

## 2022-05-30 ENCOUNTER — Other Ambulatory Visit (HOSPITAL_COMMUNITY)
Admission: RE | Admit: 2022-05-30 | Discharge: 2022-05-30 | Disposition: A | Discharge status: Home / Self Care | Payer: 59 | Source: Ambulatory Visit | Attending: Obstetrics and Gynecology | Admitting: Obstetrics and Gynecology

## 2022-05-30 ENCOUNTER — Encounter: Payer: Self-pay | Admitting: Obstetrics & Gynecology

## 2022-05-30 ENCOUNTER — Ambulatory Visit (INDEPENDENT_AMBULATORY_CARE_PROVIDER_SITE_OTHER): Payer: 59 | Admitting: Obstetrics & Gynecology

## 2022-05-30 VITALS — BP 111/72 | HR 80 | Ht 62.0 in | Wt 138.0 lb

## 2022-05-30 DIAGNOSIS — Z01419 Encounter for gynecological examination (general) (routine) without abnormal findings: Secondary | ICD-10-CM | POA: Diagnosis not present

## 2022-05-30 DIAGNOSIS — Z3042 Encounter for surveillance of injectable contraceptive: Secondary | ICD-10-CM

## 2022-05-30 MED ORDER — MEDROXYPROGESTERONE ACETATE 150 MG/ML IM SUSP: 150.0000 mg | INTRAMUSCULAR | 0 refills | Status: DC

## 2022-05-30 NOTE — Progress Notes (Signed)
Patient ID: Kari [name], female   DOB: [birth year], 26 y.o.   MRN: [id number]  Chief Complaint  Patient presents with   Annual Exam    HPI Kari [name] is a 26 y.o. female.  [id number] No LMP recorded. Patient has had an injection. She is using DMPA and wants to continue. STD testing requested HPI  Past Medical History:  Diagnosis Date   Anemia    Anxiety    Depression    Headache    migrains   IUFD (intrauterine fetal death) April 12, 2015    Past Surgical History:  Procedure Laterality Date   APPENDECTOMY     HERNIA REPAIR      Family History  Problem Relation Age of Onset   Alcohol abuse Neg Hx    Arthritis Neg Hx    Asthma Neg Hx    Birth defects Neg Hx    Cancer Neg Hx    COPD Neg Hx    Depression Neg Hx    Diabetes Neg Hx    Drug abuse Neg Hx    Early death Neg Hx    Hearing loss Neg Hx    Heart disease Neg Hx    Hyperlipidemia Neg Hx    Hypertension Neg Hx    Kidney disease Neg Hx    Learning disabilities Neg Hx    Mental illness Neg Hx    Mental retardation Neg Hx    Miscarriages / Stillbirths Neg Hx    Stroke Neg Hx    Vision loss Neg Hx    Varicose Veins Neg Hx     Social History Social History   Tobacco Use   Smoking status: Every Day    Types: Cigars   Smokeless tobacco: Never  Vaping Use   Vaping Use: Never used  Substance Use Topics   Alcohol use: Yes    Comment: occ   Drug use: No    No Known Allergies  Current Outpatient Medications  Medication Sig Dispense Refill   medroxyPROGESTERone (DEPO-PROVERA) 150 MG/ML injection Inject 1 mL (150 mg total) into the muscle every 3 (three) months. 1 mL 0   estradiol (CLIMARA) 0.1 mg/24hr patch Place 1 patch (0.1 mg total) onto the skin once a week. 13 patch 0   ferrous sulfate (FERROUSUL) 325 (65 FE) MG tablet Take 1 tablet (325 mg total) by mouth 2 (two) times daily. 60 tablet 1   fluconazole (DIFLUCAN) 150 MG tablet Take one dose now and then repeat in 3 days (Patient not taking:  Reported on 07/05/2021) 2 tablet 0   medroxyPROGESTERone (DEPO-PROVERA) 150 MG/ML injection Inject 1 mL (150 mg total) into the muscle every 3 (three) months. 1 mL 0   metroNIDAZOLE (FLAGYL) 500 MG tablet Take 1 tablet (500 mg total) by mouth 2 (two) times daily. 14 tablet 0   metroNIDAZOLE (FLAGYL) 500 MG tablet Take 1 tablet (500 mg total) by mouth 2 (two) times daily. 14 tablet 0   Multiple Vitamin (MULTIVITAMIN) tablet Take 1 tablet by mouth daily. (Patient not taking: No sig reported)     Current Facility-Administered Medications  Medication Dose Route Frequency Provider Last Rate Last Admin   medroxyPROGESTERone (DEPO-PROVERA) injection 150 mg  150 mg Intramuscular Q90 days Warden Fillers, MD   150 mg at 03/12/22 1147    Review of Systems Review of Systems  Constitutional: Negative.   Cardiovascular: Negative.   Endocrine: Negative.   Genitourinary: Negative.   All other systems reviewed and are negative.  Blood  pressure 111/72, pulse 80, height 5\' 2"  (1.575 m), weight 138 lb (62.6 kg), unknown if currently breastfeeding.  Physical Exam Physical Exam Vitals and nursing note reviewed. Exam conducted with a chaperone present.  Constitutional:      Appearance: Normal appearance.  Cardiovascular:     Rate and Rhythm: Normal rate.  Chest:  Breasts:    Right: Normal.     Left: Normal.  Abdominal:     General: Abdomen is flat.     Palpations: Abdomen is soft.  Genitourinary:    General: Normal vulva.     Exam position: Lithotomy position.     Vagina: Normal.  Skin:    General: Skin is warm and dry.  Neurological:     Mental Status: She is alert.  Psychiatric:        Mood and Affect: Mood normal.        Behavior: Behavior normal.    Data Reviewed Pap nl 2022  Assessment Well woman exam with routine gynecological exam - Plan: Cervicovaginal ancillary only( [place]), RPR, HIV antibody (with reflex)  Encounter for surveillance of injectable  contraceptive   Plan DMPA Q3 mo RTC 1 year    2023 05/30/2022, 4:12 PM

## 2022-05-31 LAB — CERVICOVAGINAL ANCILLARY ONLY
Chlamydia: NEGATIVE
Comment: NEGATIVE
Comment: NEGATIVE
Comment: NORMAL
Neisseria Gonorrhea: NEGATIVE
Trichomonas: NEGATIVE

## 2022-05-31 LAB — HIV ANTIBODY (ROUTINE TESTING W REFLEX): HIV Screen 4th Generation wRfx: NONREACTIVE

## 2022-05-31 LAB — RPR: RPR Ser Ql: NONREACTIVE

## 2022-06-01 ENCOUNTER — Ambulatory Visit: Payer: 59

## 2022-06-08 ENCOUNTER — Other Ambulatory Visit (HOSPITAL_BASED_OUTPATIENT_CLINIC_OR_DEPARTMENT_OTHER): Payer: Self-pay

## 2022-06-08 ENCOUNTER — Other Ambulatory Visit: Payer: Self-pay

## 2022-06-08 ENCOUNTER — Ambulatory Visit: Payer: 59

## 2022-06-08 DIAGNOSIS — Z3042 Encounter for surveillance of injectable contraceptive: Secondary | ICD-10-CM

## 2022-06-08 MED ORDER — MEDROXYPROGESTERONE ACETATE 150 MG/ML IM SUSY
150.0000 mg | PREFILLED_SYRINGE | INTRAMUSCULAR | 1 refills | Status: DC
  Filled 2022-06-08: qty 1, 90d supply, fill #0
  Filled 2022-09-01: qty 1, 90d supply, fill #1

## 2022-06-08 NOTE — Progress Notes (Signed)
Patient called and forgot to pick up prescription from [facility]. Made aware we will send into the pharmacy here at [facility] and she can pick it up before coming up for her appointment. Armandina Stammer RN

## 2022-06-08 NOTE — Progress Notes (Unsigned)
Date last pap: 05-30-22. Last Depo-Provera: 03-12-22. Side Effects if any: none. Serum HCG indicated? Not indicated. Depo-Provera 150 mg IM given by: Burnard Leigh RN . Next appointment due 3 months.  Patient transferring care to [facility] from [facility] since she lives in [place]. Armandina Stammer RN

## 2022-06-11 ENCOUNTER — Ambulatory Visit: Payer: 59

## 2022-09-03 ENCOUNTER — Other Ambulatory Visit (HOSPITAL_COMMUNITY)
Admission: RE | Admit: 2022-09-03 | Discharge: 2022-09-03 | Disposition: A | Discharge status: Home / Self Care | Payer: 59 | Source: Ambulatory Visit | Attending: Obstetrics & Gynecology | Admitting: Obstetrics & Gynecology

## 2022-09-03 ENCOUNTER — Ambulatory Visit (INDEPENDENT_AMBULATORY_CARE_PROVIDER_SITE_OTHER): Payer: 59

## 2022-09-03 ENCOUNTER — Other Ambulatory Visit (HOSPITAL_BASED_OUTPATIENT_CLINIC_OR_DEPARTMENT_OTHER): Payer: Self-pay

## 2022-09-03 VITALS — BP 99/66 | HR 83

## 2022-09-03 DIAGNOSIS — Z113 Encounter for screening for infections with a predominantly sexual mode of transmission: Secondary | ICD-10-CM | POA: Diagnosis present

## 2022-09-03 DIAGNOSIS — N3000 Acute cystitis without hematuria: Secondary | ICD-10-CM

## 2022-09-03 DIAGNOSIS — R300 Dysuria: Secondary | ICD-10-CM | POA: Diagnosis not present

## 2022-09-03 DIAGNOSIS — Z3042 Encounter for surveillance of injectable contraceptive: Secondary | ICD-10-CM

## 2022-09-03 LAB — POCT URINALYSIS DIPSTICK
Glucose, UA: NEGATIVE
Leukocytes, UA: NEGATIVE
Nitrite, UA: NEGATIVE
Protein, UA: NEGATIVE
Spec Grav, UA: 1.015 (ref 1.010–1.025)
Urobilinogen, UA: 0.2 E.U./dL
pH, UA: 6.5 (ref 5.0–8.0)

## 2022-09-03 NOTE — Progress Notes (Signed)
Date last pap: 02-09-21. Last Depo-Provera: 06-08-2022. Side Effects if any: none. Serum HCG indicated? nA. Depo-Provera 150 mg IM given by: Burnard Leigh RN. Next appointment due 3 months.    Patient states she has been having some dysuria and requested we check for UTI and a self swab for GC/CHl/trich. Lannette Donath

## 2022-09-04 LAB — CERVICOVAGINAL ANCILLARY ONLY
Bacterial Vaginitis (gardnerella): NEGATIVE
Candida Glabrata: NEGATIVE
Candida Vaginitis: NEGATIVE
Chlamydia: NEGATIVE
Comment: NEGATIVE
Comment: NEGATIVE
Comment: NEGATIVE
Comment: NEGATIVE
Comment: NEGATIVE
Comment: NORMAL
Neisseria Gonorrhea: NEGATIVE
Trichomonas: NEGATIVE

## 2022-09-05 LAB — URINE CULTURE

## 2022-09-05 MED ORDER — SULFAMETHOXAZOLE-TRIMETHOPRIM 800-160 MG PO TABS: 1.0000 | ORAL_TABLET | Freq: Two times a day (BID) | ORAL | 0 refills | Status: AC

## 2022-09-05 NOTE — Addendum Note (Signed)
Addended by: Jaynie Collins A on: 09/05/2022 03:03 PM   Modules accepted: Orders

## 2022-10-02 ENCOUNTER — Ambulatory Visit: Payer: 59

## 2022-10-03 ENCOUNTER — Ambulatory Visit (INDEPENDENT_AMBULATORY_CARE_PROVIDER_SITE_OTHER): Payer: 59

## 2022-10-03 ENCOUNTER — Other Ambulatory Visit (HOSPITAL_COMMUNITY)
Admission: RE | Admit: 2022-10-03 | Discharge: 2022-10-03 | Disposition: A | Discharge status: Home / Self Care | Payer: 59 | Source: Ambulatory Visit | Attending: Obstetrics & Gynecology | Admitting: Obstetrics & Gynecology

## 2022-10-03 VITALS — BP 118/75 | HR 80

## 2022-10-03 DIAGNOSIS — N898 Other specified noninflammatory disorders of vagina: Secondary | ICD-10-CM | POA: Diagnosis not present

## 2022-10-03 NOTE — Progress Notes (Signed)
Patient requesting pregnancy test due to nausea.  Patient states she normally doesn't have a period with Depo.  SUBJECTIVE:  26 y.o. female complains of creamy vaginal discharge for 14 day(s) with odor. Denies abnormal vaginal bleeding or significant pelvic pain or fever. No UTI symptoms. Denies history of known exposure to STD.  No LMP recorded. Patient has had an injection.  OBJECTIVE:  She appears well, afebrile.   ASSESSMENT:  Vaginal Discharge  Vaginal Odor   PLAN:  BVAG, CVAG probe sent to lab. Treatment: To be determined once lab results are received ROV prn if symptoms persist or worsen.

## 2022-10-05 LAB — CERVICOVAGINAL ANCILLARY ONLY
Bacterial Vaginitis (gardnerella): POSITIVE — AB
Candida Glabrata: NEGATIVE
Candida Vaginitis: NEGATIVE
Comment: NEGATIVE
Comment: NEGATIVE
Comment: NEGATIVE

## 2022-10-08 ENCOUNTER — Encounter: Payer: Self-pay | Admitting: Obstetrics & Gynecology

## 2022-10-08 ENCOUNTER — Telehealth: Payer: Self-pay

## 2022-10-08 DIAGNOSIS — N760 Acute vaginitis: Secondary | ICD-10-CM

## 2022-10-08 MED ORDER — TINIDAZOLE 500 MG PO TABS: 2.0000 g | ORAL_TABLET | Freq: Every day | ORAL | 0 refills | Status: DC

## 2022-10-08 NOTE — Telephone Encounter (Signed)
Called patient to inform her that she tested positive for BV. Patient requests a Rx fot Tindamax. Tidamax 2 g PO x 2 days was sent to her pharmacy. Understanding was voiced. Courtland Reas l Alston Berrie, CMA

## 2022-10-09 ENCOUNTER — Other Ambulatory Visit: Payer: Self-pay | Admitting: Obstetrics & Gynecology

## 2022-11-05 ENCOUNTER — Ambulatory Visit (INDEPENDENT_AMBULATORY_CARE_PROVIDER_SITE_OTHER): Payer: 59

## 2022-11-05 ENCOUNTER — Other Ambulatory Visit (HOSPITAL_COMMUNITY)
Admission: RE | Admit: 2022-11-05 | Discharge: 2022-11-05 | Disposition: A | Discharge status: Home / Self Care | Payer: 59 | Source: Ambulatory Visit | Attending: Family Medicine | Admitting: Family Medicine

## 2022-11-05 VITALS — BP 99/53 | HR 76 | Wt 143.0 lb

## 2022-11-05 DIAGNOSIS — R8290 Unspecified abnormal findings in urine: Secondary | ICD-10-CM

## 2022-11-05 DIAGNOSIS — N898 Other specified noninflammatory disorders of vagina: Secondary | ICD-10-CM | POA: Diagnosis present

## 2022-11-05 DIAGNOSIS — M549 Dorsalgia, unspecified: Secondary | ICD-10-CM

## 2022-11-05 LAB — POCT URINALYSIS DIPSTICK
Bilirubin, UA: NEGATIVE
Glucose, UA: NEGATIVE
Ketones, UA: NEGATIVE
Nitrite, UA: NEGATIVE
Protein, UA: NEGATIVE
Spec Grav, UA: 1.015 (ref 1.010–1.025)
Urobilinogen, UA: 0.2 E.U./dL
pH, UA: 7 (ref 5.0–8.0)

## 2022-11-05 NOTE — Progress Notes (Deleted)
SUBJECTIVE:  26 y.o. female complains of clear vaginal discharge for 4 day(s). Denies abnormal vaginal bleeding or significant pelvic pain or fever. No UTI symptoms. Denies history of known exposure to STD.  No LMP recorded (lmp unknown). Patient has had an injection.  OBJECTIVE:  She appears well, afebrile. Urine dipstick: negative for all components.  ASSESSMENT:  Vaginal Discharge  Vaginal Odor   PLAN:  BVAG, CVAG probe sent to lab. Treatment: To be determined once lab results are received ROV prn if symptoms persist or worsen.

## 2022-11-05 NOTE — Progress Notes (Signed)
SUBJECTIVE:  26 y.o. female complains of clear vaginal discharge for 4 day(s). Denies abnormal vaginal bleeding or significant pelvic pain or fever. Patient states she has cloudy urine and back pain. Denies history of known exposure to STD.  No LMP recorded (lmp unknown). Patient has had an injection.  OBJECTIVE:  She appears well, afebrile. Urine dipstick: positive for leukocytes.  ASSESSMENT:  Vaginal Discharge  Vaginal Odor   PLAN:  BVAG, CVAG probe sent to lab. Treatment: To be determined once lab results are received ROV prn if symptoms persist or worsen.

## 2022-11-07 LAB — CERVICOVAGINAL ANCILLARY ONLY
Bacterial Vaginitis (gardnerella): POSITIVE — AB
Candida Glabrata: NEGATIVE
Candida Vaginitis: NEGATIVE
Comment: NEGATIVE
Comment: NEGATIVE
Comment: NEGATIVE

## 2022-11-07 MED ORDER — METRONIDAZOLE 500 MG PO TABS: 500.0000 mg | ORAL_TABLET | Freq: Two times a day (BID) | ORAL | 0 refills | Status: AC

## 2022-11-07 NOTE — Addendum Note (Signed)
Addended by: Reva Bores on: 11/07/2022 05:08 PM   Modules accepted: Orders

## 2022-11-08 LAB — URINE CULTURE

## 2022-11-08 MED ORDER — CEFDINIR 300 MG PO CAPS: 300.0000 mg | ORAL_CAPSULE | Freq: Two times a day (BID) | ORAL | 0 refills | Status: AC

## 2022-11-08 NOTE — Addendum Note (Signed)
Addended by: Reva Bores on: 11/08/2022 02:46 PM   Modules accepted: Orders

## 2022-11-26 ENCOUNTER — Other Ambulatory Visit (HOSPITAL_BASED_OUTPATIENT_CLINIC_OR_DEPARTMENT_OTHER): Payer: Self-pay

## 2022-11-26 ENCOUNTER — Ambulatory Visit (INDEPENDENT_AMBULATORY_CARE_PROVIDER_SITE_OTHER): Payer: 59

## 2022-11-26 DIAGNOSIS — Z3042 Encounter for surveillance of injectable contraceptive: Secondary | ICD-10-CM

## 2022-11-26 MED ORDER — MEDROXYPROGESTERONE ACETATE 150 MG/ML IM SUSY
150.0000 mg | PREFILLED_SYRINGE | INTRAMUSCULAR | 3 refills | Status: DC
  Filled 2022-11-26: qty 1, 90d supply, fill #0
  Filled 2023-03-14: qty 1, 84d supply, fill #1
  Filled 2023-07-29: qty 1, 90d supply, fill #1
  Filled 2023-07-29 – 2023-09-13 (×2): qty 1, 84d supply, fill #1

## 2022-11-26 NOTE — Progress Notes (Cosign Needed Addendum)
Date last pap: 01-30-21 Last Depo-Provera: 09-03-2022. Side Effects if any: Had some spotting last week. Serum HCG indicated? no. Depo-Provera 150 mg IM given by: Binnie Kand RN. Next appointment due 3 months patient to schedule before leaving.   Armandina Stammer RN

## 2022-12-13 ENCOUNTER — Emergency Department (HOSPITAL_BASED_OUTPATIENT_CLINIC_OR_DEPARTMENT_OTHER)
Admission: EM | Admit: 2022-12-13 | Discharge: 2022-12-13 | Disposition: A | Discharge status: Home / Self Care | Payer: 59 | Attending: Emergency Medicine | Admitting: Emergency Medicine

## 2022-12-13 ENCOUNTER — Other Ambulatory Visit: Payer: Self-pay

## 2022-12-13 ENCOUNTER — Encounter (HOSPITAL_BASED_OUTPATIENT_CLINIC_OR_DEPARTMENT_OTHER): Payer: Self-pay | Admitting: Emergency Medicine

## 2022-12-13 DIAGNOSIS — S0990XA Unspecified injury of head, initial encounter: Secondary | ICD-10-CM | POA: Diagnosis present

## 2022-12-13 DIAGNOSIS — W503XXA Accidental bite by another person, initial encounter: Secondary | ICD-10-CM | POA: Diagnosis not present

## 2022-12-13 DIAGNOSIS — S060X0A Concussion without loss of consciousness, initial encounter: Secondary | ICD-10-CM | POA: Diagnosis not present

## 2022-12-13 MED ORDER — ACETAMINOPHEN 325 MG PO TABS: 650.0000 mg | ORAL_TABLET | Freq: Once | ORAL | Status: DC
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
## 2022-12-14 ENCOUNTER — Encounter: Payer: Self-pay | Admitting: Family Medicine

## 2022-12-14 ENCOUNTER — Ambulatory Visit (INDEPENDENT_AMBULATORY_CARE_PROVIDER_SITE_OTHER): Payer: 59 | Admitting: Family Medicine

## 2022-12-14 VITALS — BP 108/62 | Ht 62.0 in | Wt 140.0 lb

## 2022-12-14 DIAGNOSIS — S060X0A Concussion without loss of consciousness, initial encounter: Secondary | ICD-10-CM | POA: Diagnosis not present

## 2022-12-14 MED ORDER — TRAZODONE HCL 50 MG PO TABS: 50.0000 mg | ORAL_TABLET | Freq: Every day | ORAL | 1 refills | Status: DC
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
## 2022-12-14 NOTE — Patient Instructions (Signed)
Nice to meet you  Please try heat on the neck  We have made a referral to physical therapy  Please try to practice sleepy hygiene  Please send me a message in MyChart with any questions or updates.  Please see me back in 2 weeks.   --Dr. Jordan Likes  Sleep is an integral part of our bodies ability to recover from our daily activities and is key to making you body perform at its maximal potential.  Establishing and maintaining a healthy sleep pattern can not only make you feel better it can help many chronic illnesses including high blood pressure, high cholesterol, obesity, chronic pain syndromes, and many others.  Some key things to remember regarding sleep are: Establish a consistent nightly routine that you do each night before bed. Avoid caffeine, tobacco and alcohol as all of these drugs will cause sleep disturbances.   Establish an exercise routine.  This can be as simple as walking, dancing or what every you find gets your heart rate elevated to the point you can speak in only 3-4 word sentences.  Exercise will help make falling and staying asleep easier.   If you have difficulty with sleep, reserve the bedroom for sleeping; do not watch TV, read, eat or exercise in your bed room.      - Watching TV in bed can trick your brain into thinking it is day time and will reset your internal clock.  If you are going to watch TV before bed do so outside of the bedroom.  Although it is best to avoid screens for the hour prior to bed that is difficult to do in our current technology driven society - at the very least it is imperative that you remove TV from the bed room.      - If you have a hard time falling asleep avoid showering and exercising prior to bed.
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
## 2022-12-25 ENCOUNTER — Ambulatory Visit: Payer: 59 | Attending: Family Medicine | Admitting: Physical Therapy

## 2022-12-25 ENCOUNTER — Ambulatory Visit: Payer: 59 | Admitting: Family Medicine

## 2022-12-26 ENCOUNTER — Ambulatory Visit: Payer: 59 | Admitting: Family Medicine

## 2023-02-18 ENCOUNTER — Ambulatory Visit: Payer: 59

## 2023-02-20 ENCOUNTER — Encounter (HOSPITAL_BASED_OUTPATIENT_CLINIC_OR_DEPARTMENT_OTHER): Payer: Self-pay | Admitting: Emergency Medicine

## 2023-02-20 ENCOUNTER — Other Ambulatory Visit: Payer: Self-pay

## 2023-02-20 ENCOUNTER — Emergency Department (HOSPITAL_BASED_OUTPATIENT_CLINIC_OR_DEPARTMENT_OTHER)
Admission: EM | Admit: 2023-02-20 | Discharge: 2023-02-20 | Disposition: A | Discharge status: Home / Self Care | Payer: Medicaid Other | Attending: Emergency Medicine | Admitting: Emergency Medicine

## 2023-02-20 DIAGNOSIS — J069 Acute upper respiratory infection, unspecified: Secondary | ICD-10-CM | POA: Diagnosis not present

## 2023-02-20 DIAGNOSIS — Z1152 Encounter for screening for COVID-19: Secondary | ICD-10-CM | POA: Diagnosis not present

## 2023-02-20 DIAGNOSIS — R509 Fever, unspecified: Secondary | ICD-10-CM | POA: Diagnosis present

## 2023-02-20 LAB — GROUP A STREP BY PCR: Group A Strep by PCR: NOT DETECTED

## 2023-02-20 LAB — RESP PANEL BY RT-PCR (RSV, FLU A&B, COVID)  RVPGX2
Influenza A by PCR: NEGATIVE
Influenza B by PCR: NEGATIVE
Resp Syncytial Virus by PCR: NEGATIVE
SARS Coronavirus 2 by RT PCR: NEGATIVE

## 2023-02-20 NOTE — ED Triage Notes (Signed)
URI symptoms since sunday

## 2023-02-20 NOTE — ED Provider Notes (Signed)
[place] [facility]ERGENCY DEPARTMENT [facility] Provider Note   CSN: [id number] Arrival date & time: 02/20/23  [id number]     History  Chief Complaint  Patient presents with   URI    Kari [name] is a 27 y.o. female with medical history of anemia, anxiety, depression and headaches.  The patient presents to the ED for evaluation of fever, body aches and chills and sore throat.  Patient reports that meeting on Sunday she developed the above-stated symptoms.  Patient reports that she does have sick contacts in the household, all 4 of her children are sick along with her mother who was recently sick with the flu.  Patient mother denies any nausea, vomiting, diarrhea, trouble swallowing.   URI Presenting symptoms: fever and sore throat   Associated symptoms: myalgias        Home Medications Prior to Admission medications   Medication Sig Start Date End Date Taking? Authorizing Provider  medroxyPROGESTERone Acetate 150 MG/ML SUSY Inject 1 mL (150 mg total) into the muscle every 3 (three) months. 06/08/22   Woodroe Mode, MD  medroxyPROGESTERone Acetate 150 MG/ML SUSY Inject 1 mL (150 mg total) into the muscle every 3 (three) months. 11/26/22   Darliss Cheney, MD  Multiple Vitamin (MULTIVITAMIN) tablet Take 1 tablet by mouth daily. Patient not taking: Reported on 01/26/2021    [provider]  tinidazole (TINDAMAX) 500 MG tablet Take 4 tablets (2,000 mg total) by mouth daily with breakfast. Patient not taking: Reported on 11/05/2022 10/08/22   Darliss Cheney, MD  traZODone (DESYREL) 50 MG tablet Take 1 tablet (50 mg total) by mouth at bedtime. 12/14/22   Rosemarie Ax, MD      Allergies    Patient has no known allergies.    Review of Systems   Review of Systems  Constitutional:  Positive for chills and fever.  HENT:  Positive for sore throat.   Musculoskeletal:  Positive for myalgias.  All other systems reviewed and are negative.   Physical  Exam Updated Vital Signs BP 111/64 (BP Location: Right Arm)   Pulse 96   Temp 98.7 F (37.1 C) (Oral)   Resp 17   Ht '5\' 2"'$  (1.575 m)   Wt 63.5 kg   SpO2 98%   BMI 25.61 kg/m  Physical Exam Vitals and nursing note reviewed.  Constitutional:      General: She is not in acute distress.    Appearance: She is not ill-appearing, toxic-appearing or diaphoretic.  HENT:     Head: Normocephalic and atraumatic.     Nose: Nose normal. No congestion.     Mouth/Throat:     Mouth: Mucous membranes are moist.     Pharynx: Oropharynx is clear. Posterior oropharyngeal erythema present.  Eyes:     Extraocular Movements: Extraocular movements intact.     Conjunctiva/sclera: Conjunctivae normal.     Pupils: Pupils are equal, round, and reactive to light.  Cardiovascular:     Rate and Rhythm: Normal rate and regular rhythm.  Pulmonary:     Effort: Pulmonary effort is normal.     Breath sounds: Normal breath sounds. No wheezing.  Abdominal:     General: Abdomen is flat. Bowel sounds are normal.     Palpations: Abdomen is soft.     Tenderness: There is no abdominal tenderness.  Musculoskeletal:     Cervical back: Normal range of motion and neck supple. No tenderness.  Skin:    General: Skin is warm and  dry.     Capillary Refill: Capillary refill takes less than 2 seconds.  Neurological:     Mental Status: She is alert and oriented to person, place, and time.     ED Results / Procedures / Treatments   Labs (all labs ordered are listed, but only abnormal results are displayed) Labs Reviewed  RESP PANEL BY RT-PCR (RSV, FLU A&B, COVID)  [id number]  GROUP A STREP BY PCR    EKG None  Radiology No results found.  Procedures Procedures   Medications Ordered in ED Medications - No data to display  ED Course/ Medical Decision Making/ A&P  Medical Decision Making  27 year old female presents to ED for evaluation.  Please see HPI for further details.  On examination patient afebrile  and nontachycardic.  Lung sounds are clear bilaterally, she is not hypoxic on room air.  Abdomen soft and compressible throughout.  Posterior oropharynx is erythematous however no exudate.  Patient uvula is midline, she is not drooling, there is no change in phonation, there is no sign of RPA or PTA.  No sign of Ludwig angina.  Viral testing negative for all.  Group A strep testing negative for all.  Patient most likely suffering from unknown viral illness.  Patient will be advised to treat symptoms conservatively at home, follow-up for any new or worsening signs or symptoms.  Patient given return precautions and she voiced understanding.  Patient advised on how to treat symptoms at home.  Patient discharged in stable condition.   Final Clinical Impression(s) / ED Diagnoses Final diagnoses:  Viral URI with cough    Rx / DC Orders ED Discharge Orders     None         Azucena Cecil, PA-C 02/28/23 [place], DO 02/28/23 2258

## 2023-02-20 NOTE — ED Notes (Signed)
Reviewed discharge instructions and follow up with pt. Pt states understanding

## 2023-02-20 NOTE — Discharge Instructions (Addendum)
Return to the ED with any new or worsening signs or symptoms Your testing done here today was negative.  You will most likely suffering from unknown viral illness.  Please take ibuprofen for body aches and chills, Tylenol for headaches and fevers.  Please purchase ICAM as it will help shorten duration of symptoms.  You may also purchase electrolyte supplementation beverages such as Pedialyte, Body Armor.  Please push fluids.  Please eat high-protein low-fat diet. Please read attached guide concerning viral respiratory infection

## 2023-03-14 ENCOUNTER — Other Ambulatory Visit (HOSPITAL_BASED_OUTPATIENT_CLINIC_OR_DEPARTMENT_OTHER): Payer: Self-pay

## 2023-03-14 ENCOUNTER — Other Ambulatory Visit: Payer: Self-pay

## 2023-03-14 ENCOUNTER — Ambulatory Visit (INDEPENDENT_AMBULATORY_CARE_PROVIDER_SITE_OTHER): Payer: Medicaid Other

## 2023-03-14 VITALS — BP 99/55 | HR 65 | Wt 140.0 lb

## 2023-03-14 DIAGNOSIS — Z3202 Encounter for pregnancy test, result negative: Secondary | ICD-10-CM

## 2023-03-14 DIAGNOSIS — Z3042 Encounter for surveillance of injectable contraceptive: Secondary | ICD-10-CM

## 2023-03-14 LAB — POCT URINE PREGNANCY: Preg Test, Ur: NEGATIVE

## 2023-03-14 MED ORDER — MEDROXYPROGESTERONE ACETATE 150 MG/ML IM SUSP: 150.0000 mg | INTRAMUSCULAR | 2 refills | Status: DC

## 2023-03-14 MED ORDER — MEDROXYPROGESTERONE ACETATE 150 MG/ML IM SUSY
150.0000 mg | PREFILLED_SYRINGE | Freq: Once | INTRAMUSCULAR | Status: AC
  Administered 2023-03-14: 150 mg via INTRAMUSCULAR

## 2023-03-14 NOTE — Progress Notes (Signed)
Patient would like to restart Depo Provera. Last Depo was 11-26-22.  Negative UPT Dr. Nehemiah Settle consulted and said we could give Depo Provera today since patient  had recent LMP as well.   Patient states she has tried boric acid suppositories and is still having recurrent infections. Advised to make appointment with provider to discuss recurrent infections.   Kathrene Alu RN

## 2023-03-19 ENCOUNTER — Ambulatory Visit (INDEPENDENT_AMBULATORY_CARE_PROVIDER_SITE_OTHER): Payer: Medicaid Other

## 2023-03-19 ENCOUNTER — Encounter: Payer: Self-pay | Admitting: Family Medicine

## 2023-03-19 ENCOUNTER — Ambulatory Visit (INDEPENDENT_AMBULATORY_CARE_PROVIDER_SITE_OTHER): Payer: Medicaid Other | Admitting: Family Medicine

## 2023-03-19 ENCOUNTER — Other Ambulatory Visit (HOSPITAL_COMMUNITY)
Admission: RE | Admit: 2023-03-19 | Discharge: 2023-03-19 | Disposition: A | Discharge status: Home / Self Care | Payer: Medicaid Other | Source: Ambulatory Visit

## 2023-03-19 VITALS — BP 109/62 | HR 70 | Ht 62.0 in | Wt 141.0 lb

## 2023-03-19 VITALS — BP 100/60 | Ht 62.0 in | Wt 140.0 lb

## 2023-03-19 DIAGNOSIS — N760 Acute vaginitis: Secondary | ICD-10-CM

## 2023-03-19 DIAGNOSIS — N898 Other specified noninflammatory disorders of vagina: Secondary | ICD-10-CM

## 2023-03-19 DIAGNOSIS — A499 Bacterial infection, unspecified: Secondary | ICD-10-CM | POA: Diagnosis not present

## 2023-03-19 DIAGNOSIS — B9689 Other specified bacterial agents as the cause of diseases classified elsewhere: Secondary | ICD-10-CM

## 2023-03-19 NOTE — Patient Instructions (Signed)
Boric Acid Vaginal Suppositories What is this medication? BORIC ACID (BOHR ik AS id) may support vaginal health. It may relieve the symptoms of a yeast and bacterial infection, such as itching, burning, and odor.  COMMON BRAND NAME(S): AZO Boric Acid with Aloe Vera, Hylafem  How should I use this medication? This medication is for use in the vagina. Do not take by mouth. Wash hands before and after use. Use this medication at bedtime, unless otherwise directed. For recurrent Bacterial Vaginosis, after initial treatment with an anti-infective you should start your Boric Acid suppositories.  Insert one 600mg  suppository in the vagina every night for 21 days and then once weekly for 9 weeks. After treatment if you have symptoms, treat with Boric Acid suppository for 3-5 days. If symptoms persist or return shortly after, contact the office for an appt.  Boric acid can cause death if consumed orally. Therefore you should use barrier methods during oral sex when undergoing treatment.   What if I miss a dose? If you miss a dose, use it as soon as you can. If it is almost time for your next dose, use only that dose. Do not use double or extra doses.  What may interact with this medication? Interactions are not expected. Do not use any other vaginal products without telling your care team. This list may not describe all possible interactions. Give your health care provider a list of all the medicines, herbs, non-prescription drugs, or dietary supplements you use. Also tell them if you smoke, drink alcohol, or use illegal drugs. Some items may interact with your medicine.  What should I watch for while using this medication? Tell your care team if your symptoms do not start to get better within a few days. It is better not to have sex until you have finished your treatment. This medication may cause condoms, diaphragms, and spermicides to not work as well. Do not rely on any of these methods to prevent  sexually transmitted infections (STIs) or pregnancy while you are using this medication. Vaginal medications may come out of the vagina during treatment. To keep the medication from getting on your clothing, wear a panty liner, but change frequently to avoid concurrent infections.  The use of tampons is not recommended. To help clear up the infection, wear freshly washed cotton, not synthetic, underwear.  What side effects may I notice from receiving this medication? Side effects that you should report to your care team as soon as possible: Allergic reactions--skin rash, itching, hives, swelling of the face, lips, tongue, or throat Unusual vaginal discharge, itching, or odor Side effects that usually do not require medical attention (report to your care team if they continue or are bothersome): Vaginal irritation at the application site  This list may not describe all possible side effects. Call your doctor for medical advice about side effects. You may report side effects to FDA at 1-800-FDA-1088.  Where should I keep my medication? Keep out of the reach of children and pets.

## 2023-03-19 NOTE — Progress Notes (Signed)
Patient complaining of recurrent BV. Patient has tried boric acid suppositories for last two months. Kathrene Alu RN

## 2023-03-19 NOTE — Progress Notes (Signed)
   GYNECOLOGY PROBLEM OFFICE VISIT NOTE  History:  Kari [name] is a 27 y.o. [phone] African American female here today for vaginal discharge.  Patient reports history of recurrent BV despite treatment.  She states she has used flagyl, tinidazole, and boric acid without issues.  Patient reports that the most successful treatment was with using tinidazole once f/b a 2nd the next day.  She reports recently she used boric acid for 7 days, but odor returned ~ 3 days later. She denies any abnormal vaginal discharge, but reports continued odor.  She also denies itching or irritation.  She is currently taking Depo Provera without issues.  She states she is using a PH soap, but usually uses Dove sensitive.  She is not currently sexually active.  bleeding, pelvic pain or other concerns.   Past Medical History:  Diagnosis Date   Anemia    Anxiety    Depression    Headache    migrains   IUFD (intrauterine fetal death) 2015-04-05    Past Surgical History:  Procedure Laterality Date   APPENDECTOMY     HERNIA REPAIR      The following portions of the patient's history were reviewed and updated as appropriate: allergies, current medications, past family history, past medical history, past social history, past surgical history and problem list.   Health Maintenance:  Normal pap and negative HRHPV on Feb 2022.  No mammogram on file d/t age.   Review of Systems:  Genito-Urinary ROS: no dysuria, trouble voiding, or hematuria Gastrointestinal ROS: no abdominal pain, change in bowel habits, or black or bloody stools Objective:  Vitals: BP 109/62   Pulse 70   Ht 5\' 2"  (1.575 m)   Wt 141 lb (64 kg)   LMP 03/10/2023   BMI 25.79 kg/m   Physical Exam: Physical Exam Constitutional:      Appearance: Normal appearance.  Genitourinary:     Vulva normal.     Genitourinary Comments: NEFG No apparent discharge or erythema noted at introitus or surrounding labias. CV collected blinding. No apparent  odor.   HENT:     Head: Normocephalic and atraumatic.  Eyes:     Conjunctiva/sclera: Conjunctivae normal.  Cardiovascular:     Rate and Rhythm: Normal rate.  Pulmonary:     Effort: Pulmonary effort is normal. No respiratory distress.  Musculoskeletal:     Cervical back: Normal range of motion.  Neurological:     Mental Status: She is alert and oriented to person, place, and time.  Skin:    General: Skin is warm and dry.  Psychiatric:        Mood and Affect: Mood normal.        Behavior: Behavior normal.  Vitals reviewed. Exam conducted with a chaperone present.      Labs and Imaging: No results found.  Assessment & Plan:  27 year old Vaginal odor H/o Recurrent BV   -Exam performed and findings discussed. -Informed that if test return positive, will send script for Tinidazole 2grams x 2 days f/b boric acid regimen for 21 days. -Further informed that if negative, will send script for tinidazole 2grams once f/b boric acid regimen. -Patient agreeable with plan and without questions.  -CV collected. Await results.    Total face-to-face time with patient: 15 minutes   Gavin Pound, [place] 03/19/2023 12:29 PM

## 2023-03-19 NOTE — Progress Notes (Signed)
  Kari [name] - 27 y.o. female MRN [id number]  Date of birth: 1996-08-29  SUBJECTIVE:  Including CC & ROS.  No chief complaint on file.   Kari [name] is a 27 y.o. female that is presenting with recurrent bacterial vaginosis.    Review of Systems See HPI   HISTORY: Past Medical, Surgical, Social, and Family History Reviewed & Updated per EMR.   Pertinent Historical Findings include:  Past Medical History:  Diagnosis Date   Anemia    Anxiety    Depression    Headache    migrains   IUFD (intrauterine fetal death) 04/19/2015    Past Surgical History:  Procedure Laterality Date   APPENDECTOMY     HERNIA REPAIR       PHYSICAL EXAM:  VS: BP 100/60 (BP Location: Left Arm, Patient Position: Sitting)   Ht 5\' 2"  (1.575 m)   Wt 140 lb (63.5 kg)   LMP 03/10/2023   BMI 25.61 kg/m  Physical Exam Gen: NAD, alert, cooperative with exam, well-appearing MSK:  Neurovascularly intact       ASSESSMENT & PLAN:   Bacterial vaginosis Acute on chronic in nature.  She reports to trying Flagyl as well as boric acid.  Reports recurrent episodes despite therapy. -Advised to follow-up with gynecologist.

## 2023-03-19 NOTE — Assessment & Plan Note (Signed)
Acute on chronic in nature.  She reports to trying Flagyl as well as boric acid.  Reports recurrent episodes despite therapy. -Advised to follow-up with gynecologist.

## 2023-03-20 ENCOUNTER — Other Ambulatory Visit: Payer: Self-pay | Admitting: Obstetrics and Gynecology

## 2023-03-20 ENCOUNTER — Encounter: Payer: Self-pay | Admitting: Obstetrics and Gynecology

## 2023-03-20 DIAGNOSIS — A499 Bacterial infection, unspecified: Secondary | ICD-10-CM

## 2023-03-20 DIAGNOSIS — B9689 Other specified bacterial agents as the cause of diseases classified elsewhere: Secondary | ICD-10-CM

## 2023-03-20 LAB — CERVICOVAGINAL ANCILLARY ONLY
Bacterial Vaginitis (gardnerella): POSITIVE — AB
Candida Glabrata: NEGATIVE
Candida Vaginitis: NEGATIVE
Chlamydia: NEGATIVE
Comment: NEGATIVE
Comment: NEGATIVE
Comment: NEGATIVE
Comment: NEGATIVE
Comment: NEGATIVE
Comment: NORMAL
Neisseria Gonorrhea: NEGATIVE
Trichomonas: NEGATIVE

## 2023-03-20 MED ORDER — TINIDAZOLE 500 MG PO TABS: 2.0000 g | ORAL_TABLET | Freq: Every day | ORAL | 2 refills | Status: DC

## 2023-04-08 ENCOUNTER — Encounter: Payer: Self-pay | Admitting: *Deleted

## 2023-06-03 ENCOUNTER — Ambulatory Visit: Payer: Medicaid Other

## 2023-07-29 ENCOUNTER — Ambulatory Visit: Payer: Medicaid Other

## 2023-07-29 ENCOUNTER — Other Ambulatory Visit (HOSPITAL_BASED_OUTPATIENT_CLINIC_OR_DEPARTMENT_OTHER): Payer: Self-pay

## 2023-08-07 ENCOUNTER — Other Ambulatory Visit (HOSPITAL_BASED_OUTPATIENT_CLINIC_OR_DEPARTMENT_OTHER): Payer: Self-pay

## 2023-08-28 ENCOUNTER — Other Ambulatory Visit (HOSPITAL_COMMUNITY)
Admission: RE | Admit: 2023-08-28 | Discharge: 2023-08-28 | Disposition: A | Discharge status: Home / Self Care | Payer: Medicaid Other | Source: Ambulatory Visit | Attending: Obstetrics & Gynecology | Admitting: Obstetrics & Gynecology

## 2023-08-28 ENCOUNTER — Ambulatory Visit (INDEPENDENT_AMBULATORY_CARE_PROVIDER_SITE_OTHER): Payer: Medicaid Other

## 2023-08-28 VITALS — BP 115/62 | HR 71

## 2023-08-28 DIAGNOSIS — Z3042 Encounter for surveillance of injectable contraceptive: Secondary | ICD-10-CM

## 2023-08-28 DIAGNOSIS — N898 Other specified noninflammatory disorders of vagina: Secondary | ICD-10-CM | POA: Insufficient documentation

## 2023-08-28 DIAGNOSIS — R300 Dysuria: Secondary | ICD-10-CM

## 2023-08-28 DIAGNOSIS — Z3202 Encounter for pregnancy test, result negative: Secondary | ICD-10-CM

## 2023-08-28 LAB — POCT URINE PREGNANCY: Preg Test, Ur: NEGATIVE

## 2023-08-28 NOTE — Addendum Note (Signed)
Addended by: Anell Barr on: 08/28/2023 03:57 PM   Modules accepted: Orders

## 2023-08-28 NOTE — Progress Notes (Signed)
Patient checked out and then states she would like to do a self swab because she is having vaginal itching. Patient also states she might have a UTI and tried collecting another urine sample. Very little sample so sent urine for culture due to dysuria. Kari Stammer RN

## 2023-08-28 NOTE — Progress Notes (Signed)
Patient missed Depo Provera injection in June. Patient presents for UPT today and then will return in two week for another UPT and if both tests are negative she can get her injection for Depo Provera. Armandina Stammer RN

## 2023-08-28 NOTE — Addendum Note (Signed)
Addended by: Anell Barr on: 08/28/2023 03:51 PM   Modules accepted: Orders

## 2023-08-30 LAB — CERVICOVAGINAL ANCILLARY ONLY
Bacterial Vaginitis (gardnerella): NEGATIVE
Candida Glabrata: NEGATIVE
Candida Vaginitis: NEGATIVE
Chlamydia: NEGATIVE
Comment: NEGATIVE
Comment: NEGATIVE
Comment: NEGATIVE
Comment: NEGATIVE
Comment: NEGATIVE
Comment: NORMAL
Neisseria Gonorrhea: NEGATIVE
Trichomonas: NEGATIVE

## 2023-09-03 LAB — URINE CULTURE

## 2023-09-04 ENCOUNTER — Other Ambulatory Visit: Payer: Self-pay

## 2023-09-04 MED ORDER — NITROFURANTOIN MONOHYD MACRO 100 MG PO CAPS: 100.0000 mg | ORAL_CAPSULE | Freq: Two times a day (BID) | ORAL | 0 refills | Status: DC

## 2023-09-04 NOTE — Progress Notes (Signed)
Patient called and made aware that vaginal cultures were all negative. Patient made aware that she does have a UTI and confirmed pharmacy. Made patient aware to take full course of antibiotics. Armandina Stammer RN

## 2023-09-13 ENCOUNTER — Ambulatory Visit (INDEPENDENT_AMBULATORY_CARE_PROVIDER_SITE_OTHER): Payer: 59

## 2023-09-13 ENCOUNTER — Other Ambulatory Visit (HOSPITAL_BASED_OUTPATIENT_CLINIC_OR_DEPARTMENT_OTHER): Payer: Self-pay

## 2023-09-13 VITALS — BP 102/64 | HR 80 | Ht 62.0 in | Wt 141.0 lb

## 2023-09-13 DIAGNOSIS — Z3202 Encounter for pregnancy test, result negative: Secondary | ICD-10-CM | POA: Diagnosis not present

## 2023-09-13 DIAGNOSIS — Z3042 Encounter for surveillance of injectable contraceptive: Secondary | ICD-10-CM | POA: Diagnosis not present

## 2023-09-13 LAB — POCT URINE PREGNANCY: Preg Test, Ur: NEGATIVE

## 2023-09-13 MED ORDER — MEDROXYPROGESTERONE ACETATE 150 MG/ML IM SUSY
150.0000 mg | PREFILLED_SYRINGE | Freq: Once | INTRAMUSCULAR | Status: AC
  Administered 2023-09-13: 150 mg via INTRAMUSCULAR

## 2023-09-13 NOTE — Progress Notes (Signed)
Date last pap: 02/09/2021 Last Depo-Provera: Patient restarting depo today. Side Effects if any: N/A Urine UPT: Negative Depo-Provera 150 mg IM given by: Philemon Kingdom, CMA Next appointment due: 11/29/2023

## 2023-09-24 ENCOUNTER — Ambulatory Visit: Payer: Medicaid Other | Admitting: Obstetrics and Gynecology

## 2023-11-29 ENCOUNTER — Ambulatory Visit (INDEPENDENT_AMBULATORY_CARE_PROVIDER_SITE_OTHER): Payer: Medicaid Other

## 2023-11-29 DIAGNOSIS — R399 Unspecified symptoms and signs involving the genitourinary system: Secondary | ICD-10-CM

## 2023-11-29 DIAGNOSIS — Z3042 Encounter for surveillance of injectable contraceptive: Secondary | ICD-10-CM

## 2023-11-29 LAB — POCT URINALYSIS DIPSTICK
Bilirubin, UA: NEGATIVE
Blood, UA: NEGATIVE
Glucose, UA: NEGATIVE
Ketones, UA: NEGATIVE
Leukocytes, UA: NEGATIVE
Nitrite, UA: NEGATIVE
Protein, UA: NEGATIVE
Spec Grav, UA: 1.01 (ref 1.010–1.025)
Urobilinogen, UA: 0.2 U/dL
pH, UA: 6.5 (ref 5.0–8.0)

## 2023-11-29 MED ORDER — MEDROXYPROGESTERONE ACETATE 150 MG/ML IM SUSY
150.0000 mg | PREFILLED_SYRINGE | Freq: Once | INTRAMUSCULAR | Status: AC
  Administered 2023-11-29: 150 mg via INTRAMUSCULAR

## 2023-11-29 NOTE — Progress Notes (Signed)
SUBJECTIVE: Kari [name] is a 27 y.o. female who complains of UTI symptoms without flank pain, fever, chills, or abnormal vaginal discharge or bleeding.   OBJECTIVE: Appears well, in no apparent distress.  Vital signs are normal. Urine dipstick shows negative for all components.    ASSESSMENT: Dysuria  PLAN: Call or return to clinic prn if these symptoms worsen or fail to improve as anticipated.

## 2023-11-29 NOTE — Progress Notes (Signed)
Date last pap: 02/09/2021 Last Depo-Provera: 09/13/2023 Side Effects if any: None Serum HCG indicated? N/A Depo-Provera 150 mg IM given by: L. Ernest Mallick, RN Next appointment due: Feb 21st - March 7th   *Office stock given to patient.

## 2024-04-08 ENCOUNTER — Other Ambulatory Visit (HOSPITAL_COMMUNITY): Payer: Self-pay

## 2024-05-23 ENCOUNTER — Emergency Department (HOSPITAL_BASED_OUTPATIENT_CLINIC_OR_DEPARTMENT_OTHER)
Admission: EM | Admit: 2024-05-23 | Discharge: 2024-05-23 | Disposition: A | Discharge status: Home / Self Care | Attending: Emergency Medicine | Admitting: Emergency Medicine

## 2024-05-23 ENCOUNTER — Other Ambulatory Visit: Payer: Self-pay

## 2024-05-23 ENCOUNTER — Encounter (HOSPITAL_BASED_OUTPATIENT_CLINIC_OR_DEPARTMENT_OTHER): Payer: Self-pay | Admitting: Emergency Medicine

## 2024-05-23 DIAGNOSIS — R42 Dizziness and giddiness: Secondary | ICD-10-CM | POA: Insufficient documentation

## 2024-05-23 DIAGNOSIS — N3000 Acute cystitis without hematuria: Secondary | ICD-10-CM | POA: Insufficient documentation

## 2024-05-23 DIAGNOSIS — F17290 Nicotine dependence, other tobacco product, uncomplicated: Secondary | ICD-10-CM | POA: Diagnosis not present

## 2024-05-23 LAB — COMPREHENSIVE METABOLIC PANEL WITH GFR
ALT: 16 U/L (ref 0–44)
AST: 20 U/L (ref 15–41)
Albumin: 4.8 g/dL (ref 3.5–5.0)
Alkaline Phosphatase: 108 U/L (ref 38–126)
Anion gap: 12 (ref 5–15)
BUN: 7 mg/dL (ref 6–20)
CO2: 24 mmol/L (ref 22–32)
Calcium: 9.3 mg/dL (ref 8.9–10.3)
Chloride: 106 mmol/L (ref 98–111)
Creatinine, Ser: 0.52 mg/dL (ref 0.44–1.00)
GFR, Estimated: >60 mL/min (ref >60)
Glucose, Bld: 85 mg/dL (ref 70–99)
Potassium: 3.5 mmol/L (ref 3.5–5.1)
Sodium: 141 mmol/L (ref 135–145)
Total Bilirubin: 0.3 mg/dL (ref 0.0–1.2)
Total Protein: 8.1 g/dL (ref 6.5–8.1)

## 2024-05-23 LAB — URINALYSIS, ROUTINE W REFLEX MICROSCOPIC
Bilirubin Urine: NEGATIVE
Glucose, UA: NEGATIVE mg/dL
Hgb urine dipstick: NEGATIVE
Ketones, ur: NEGATIVE mg/dL
Leukocytes,Ua: NEGATIVE
Nitrite: POSITIVE — AB
Protein, ur: NEGATIVE mg/dL
Specific Gravity, Urine: 1.02 (ref 1.005–1.030)
pH: 6.5 (ref 5.0–8.0)

## 2024-05-23 LAB — CBC WITH DIFFERENTIAL/PLATELET
Abs Immature Granulocytes: 0.01 10*3/uL (ref 0.00–0.07)
Basophils Absolute: 0 10*3/uL (ref 0.0–0.1)
Basophils Relative: 0 %
Eosinophils Absolute: 0.2 10*3/uL (ref 0.0–0.5)
Eosinophils Relative: 3 %
HCT: 40.3 % (ref 36.0–46.0)
Hemoglobin: 13.5 g/dL (ref 12.0–15.0)
Immature Granulocytes: 0 %
Lymphocytes Relative: 39 %
Lymphs Abs: 2 10*3/uL (ref 0.7–4.0)
MCH: 25.2 pg — ABNORMAL LOW (ref 26.0–34.0)
MCHC: 33.5 g/dL (ref 30.0–36.0)
MCV: 75.3 fL — ABNORMAL LOW (ref 80.0–100.0)
Monocytes Absolute: 0.4 10*3/uL (ref 0.1–1.0)
Monocytes Relative: 8 %
Neutro Abs: 2.5 10*3/uL (ref 1.7–7.7)
Neutrophils Relative %: 50 %
Platelets: 284 10*3/uL (ref 150–400)
RBC: 5.35 MIL/uL — ABNORMAL HIGH (ref 3.87–5.11)
RDW: 13.4 % (ref 11.5–15.5)
WBC: 5 10*3/uL (ref 4.0–10.5)
nRBC: 0 % (ref 0.0–0.2)

## 2024-05-23 LAB — URINALYSIS, MICROSCOPIC (REFLEX): RBC / HPF: NONE SEEN RBC/hpf (ref 0–5)

## 2024-05-23 LAB — CBG MONITORING, ED: Glucose-Capillary: 94 mg/dL (ref 70–99)

## 2024-05-23 LAB — PREGNANCY, URINE: Preg Test, Ur: NEGATIVE

## 2024-05-23 MED ORDER — SODIUM CHLORIDE 0.9 % IV BOLUS
1000.0000 mL | Freq: Once | INTRAVENOUS | Status: AC
  Administered 2024-05-23: 1000 mL via INTRAVENOUS

## 2024-05-23 MED ORDER — NITROFURANTOIN MONOHYD MACRO 100 MG PO CAPS: 100.0000 mg | ORAL_CAPSULE | Freq: Two times a day (BID) | ORAL | 0 refills | Status: DC

## 2024-05-23 NOTE — ED Provider Notes (Signed)
 [place] [facility] Provider Note  CSN: [id number] Arrival date & time: 05/23/24 1429  Chief Complaint(s) Panic Attack  HPI Kari [name] is a 28 y.o. female history of anxiety presenting to the emergency department with lightheadedness.  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  Felt some mild panic attack type symptoms with some anxiety.  Also felt some nausea, no vomiting.  She went to rest on the couch but symptoms did not improve so she came to the ER.  No chest pain, headaches.  No fevers or chills.  No abdominal pain.  No vaginal bleeding or heavy menstrual cycle.  No palpitations.  No loss of consciousness.  Reports she has had some episodes of lightheadedness previously but this seemed worse.   Past Medical History Past Medical History:  Diagnosis Date   Anemia    Anxiety    Depression    Headache    migrains   IUFD (intrauterine fetal death) June 28, 2015   Patient Active Problem List   Diagnosis Date Noted   Bacterial vaginosis 03/19/2023   Concussion with no loss of consciousness 12/14/2022   Alpha thalassemia silent carrier 06/08/2019   Sickle cell trait (HCC) 05/25/2019   Vitamin D  deficiency 01/22/2018   History of IUFD 10/07/2015   Home Medication(s) Prior to Admission medications   Medication Sig Start Date End Date Taking? Authorizing Provider  nitrofurantoin , macrocrystal-monohydrate, (MACROBID ) 100 MG capsule Take 1 capsule (100 mg total) by mouth 2 (two) times daily. 05/23/24  Yes Mordecai Applebaum, MD  medroxyPROGESTERone  (DEPO-PROVERA ) 150 MG/ML injection Inject 1 mL (150 mg total) into the muscle every 3 (three) months. 03/14/23   Stinson, Jacob J, DO  medroxyPROGESTERone  Acetate 150 MG/ML SUSY Inject 1 mL (150 mg total) into the muscle every 3 (three) months. Patient not taking: Reported on 09/13/2023 06/08/22   Tresia Fruit, MD   medroxyPROGESTERone  Acetate 150 MG/ML SUSY Inject 1 mL (150 mg total) into the muscle every 3 (three) months. Patient not taking: Reported on 09/13/2023 11/26/22   Ajewole, Christana, MD  Multiple Vitamin (MULTIVITAMIN) tablet Take 1 tablet by mouth daily. Patient not taking: Reported on 01/26/2021    [provider]  traZODone  (DESYREL ) 50 MG tablet Take 1 tablet (50 mg total) by mouth at bedtime. 12/14/22   Margaree Shark, MD                                                                                                                                    Past Surgical History Past Surgical History:  Procedure Laterality Date   APPENDECTOMY     HERNIA REPAIR     Family History Family History  Problem Relation Age of Onset   Alcohol abuse Neg Hx    Arthritis Neg Hx    Asthma Neg Hx  Birth defects Neg Hx    Cancer Neg Hx    COPD Neg Hx    Depression Neg Hx    Diabetes Neg Hx    Drug abuse Neg Hx    Early death Neg Hx    Hearing loss Neg Hx    Heart disease Neg Hx    Hyperlipidemia Neg Hx    Hypertension Neg Hx    Kidney disease Neg Hx    Learning disabilities Neg Hx    Mental illness Neg Hx    Mental retardation Neg Hx    Miscarriages / Stillbirths Neg Hx    Stroke Neg Hx    Vision loss Neg Hx    Varicose Veins Neg Hx     Social History Social History   Tobacco Use   Smoking status: Every Day    Types: Cigars   Smokeless tobacco: Never  Vaping Use   Vaping status: Never Used  Substance Use Topics   Alcohol use: Yes    Comment: occ   Drug use: No   Allergies Patient has no known allergies.  Review of Systems Review of Systems  All other systems reviewed and are negative.   Physical Exam Vital Signs  I have reviewed the triage vital signs BP 119/70 (BP Location: Right Arm)   Pulse 71   Temp 98.6 F (37 C) (Oral)   Resp 14   Ht 5\' 2"  (1.575 m)   Wt 60.8 kg   SpO2 100%   BMI 24.51 kg/m  Physical Exam Vitals and nursing note reviewed.   Constitutional:      General: She is not in acute distress.    Appearance: She is well-developed.  HENT:     Head: Normocephalic and atraumatic.     Mouth/Throat:     Mouth: Mucous membranes are moist.  Eyes:     Pupils: Pupils are equal, round, and reactive to light.  Cardiovascular:     Rate and Rhythm: Normal rate and regular rhythm.     Heart sounds: No murmur heard. Pulmonary:     Effort: Pulmonary effort is normal. No respiratory distress.     Breath sounds: Normal breath sounds.  Abdominal:     General: Abdomen is flat.     Palpations: Abdomen is soft.     Tenderness: There is no abdominal tenderness.  Musculoskeletal:        General: No tenderness.     Right lower leg: No edema.     Left lower leg: No edema.  Skin:    General: Skin is warm and dry.  Neurological:     General: No focal deficit present.     Mental Status: She is alert. Mental status is at baseline.  Psychiatric:        Mood and Affect: Mood normal.        Behavior: Behavior normal.     ED Results and Treatments Labs (all labs ordered are listed, but only abnormal results are displayed) Labs Reviewed  CBC WITH DIFFERENTIAL/PLATELET - Abnormal; Notable for the following components:      Result Value   RBC 5.35 (*)    MCV 75.3 (*)    MCH 25.2 (*)    All other components within normal limits  URINALYSIS, ROUTINE W REFLEX MICROSCOPIC - Abnormal; Notable for the following components:   Nitrite POSITIVE (*)    All other components within normal limits  URINALYSIS, MICROSCOPIC (REFLEX) - Abnormal; Notable for the following components:   Bacteria, UA  MANY (*)    All other components within normal limits  COMPREHENSIVE METABOLIC PANEL WITH GFR  PREGNANCY, URINE  CBG MONITORING, ED                                                                                                                          Radiology No results found.  Pertinent labs & imaging results that were available during my care  of the patient were reviewed by me and considered in my medical decision making (see MDM for details).  Medications Ordered in ED Medications  sodium chloride  0.9 % bolus 1,000 mL (1,000 mLs Intravenous New Bag/Given 05/23/24 1609)                                                                                                                                     Procedures Procedures  (including critical care time)  Medical Decision Making / ED Course   MDM:  28 year old presenting to the emergency department with lightheadedness.  Patient overall well-appearing, physical examination is unremarkable.  Vital signs are normal.  Differential includes anxiety reaction, dehydration, orthostatic symptoms, anemia.  Very low concern for any cardiac cause but will check EKG.  Will check basic labs.  Will give her some fluids and reassess.  Clinical Course as of 05/23/24 1654  Sat May 23, 2024  1653 Labs reassuring.  No anemia.  No AKI.  Pregnancy negative.  Urinalysis does have positive nitrites and many bacteria.  Patient not having dysuria, but she does report that she has had some urinary frequency.  Will treat with Macrobid  as this could be contributing to her symptoms today.  She does feel better after receiving fluids. Will discharge patient to home. All questions answered. Patient comfortable with plan of discharge. Return precautions discussed with patient and specified on the after visit summary.  [WS]    Clinical Course User Index [WS] Mordecai Applebaum, MD     Lab Tests: -I ordered, reviewed, and interpreted labs.   The pertinent results include:   Labs Reviewed  CBC WITH DIFFERENTIAL/PLATELET - Abnormal; Notable for the following components:      Result Value   RBC 5.35 (*)    MCV 75.3 (*)    MCH 25.2 (*)    All other components within normal limits  URINALYSIS, ROUTINE W REFLEX MICROSCOPIC - Abnormal; Notable for the following components:   Nitrite  POSITIVE (*)    All  other components within normal limits  URINALYSIS, MICROSCOPIC (REFLEX) - Abnormal; Notable for the following components:   Bacteria, UA MANY (*)    All other components within normal limits  COMPREHENSIVE METABOLIC PANEL WITH GFR  PREGNANCY, URINE  CBG MONITORING, ED    Notable for + nitrites. See ed course   EKG   EKG Interpretation Date/Time:  Saturday May 23 2024 16:44:06 EDT Ventricular Rate:  83 PR Interval:  164 QRS Duration:  78 QT Interval:  399 QTC Calculation: 469 R Axis:   62  Text Interpretation: Sinus rhythm Low voltage, precordial leads Confirmed by Hiawatha Lout ([id number]) on 05/23/2024 4:46:22 PM          Medicines ordered and prescription drug management: Meds ordered this encounter  Medications   sodium chloride  0.9 % bolus 1,000 mL   nitrofurantoin , macrocrystal-monohydrate, (MACROBID ) 100 MG capsule    Sig: Take 1 capsule (100 mg total) by mouth 2 (two) times daily.    Dispense:  10 capsule    Refill:  0    -I have reviewed the patients home medicines and have made adjustments as needed     Reevaluation: After the interventions noted above, I reevaluated the patient and found that their symptoms have improved  Co morbidities that complicate the patient evaluation  Past Medical History:  Diagnosis Date   Anemia    Anxiety    Depression    Headache    migrains   IUFD (intrauterine fetal death) 2015-06-17      Dispostion: Disposition decision including need for hospitalization was considered, and patient discharged from emergency department.    Final Clinical Impression(s) / ED Diagnoses Final diagnoses:  Lightheadedness  Acute cystitis without hematuria     This chart was dictated using voice recognition software.  Despite best efforts to proofread,  errors can occur which can change the documentation meaning.    Mordecai Applebaum, MD 05/23/24 [phone]

## 2024-05-23 NOTE — Discharge Instructions (Signed)
 We evaluated you for your lightheadedness.  Your testing in the emergency department was reassuring.  We did see signs of a UTI and have started you on an antibiotic.  Since you are symptoms improved with fluids, you may have been dehydrated.  It could also be due to your UTI.  Please follow-up closely with your primary doctor.  If you have any new or worsening symptoms, please return to the emergency department for a recheck.

## 2024-05-23 NOTE — ED Triage Notes (Signed)
 Pt reports she became lightheaded at work today; sts she was breathing fast; sts it felt like a panic attack; +nausea; has eaten "a little" today

## 2024-05-23 NOTE — ED Notes (Signed)

## 2024-06-24 ENCOUNTER — Other Ambulatory Visit (HOSPITAL_COMMUNITY)
Admission: RE | Admit: 2024-06-24 | Discharge: 2024-06-24 | Disposition: A | Discharge status: Home / Self Care | Source: Ambulatory Visit | Attending: Family Medicine | Admitting: Family Medicine

## 2024-06-24 ENCOUNTER — Ambulatory Visit

## 2024-06-24 VITALS — BP 117/73 | HR 83 | Ht 62.0 in | Wt 137.0 lb

## 2024-06-24 DIAGNOSIS — R3915 Urgency of urination: Secondary | ICD-10-CM

## 2024-06-24 DIAGNOSIS — N949 Unspecified condition associated with female genital organs and menstrual cycle: Secondary | ICD-10-CM

## 2024-06-24 LAB — POCT URINALYSIS DIPSTICK
Bilirubin, UA: NEGATIVE
Glucose, UA: NEGATIVE
Ketones, UA: NEGATIVE
Nitrite, UA: NEGATIVE
Protein, UA: NEGATIVE
Spec Grav, UA: 1.015 (ref 1.010–1.025)
Urobilinogen, UA: NEGATIVE U/dL — AB
pH, UA: 6.5 (ref 5.0–8.0)

## 2024-06-24 NOTE — Progress Notes (Signed)
 SUBJECTIVE:  28 y.o. female who desires a STI screen. Denies abnormal vaginal discharge, bleeding or significant pelvic pain. Does complain of vaginal discomfort.  Complaining of urgency with urination.   Denies history of known exposure to STD.  Patient's last menstrual period was 06/04/2024 (exact date).  OBJECTIVE:  She appears well.   ASSESSMENT:  STI Screen  Abnormal urine dipstick.  Culture sent  PLAN:  Pt offered STI blood screening-declined GC, chlamydia, and trichomonas probe sent to lab.  Treatment: To be determined once lab results are received.  Pt follow up as needed.   Erminio DELENA Rumps

## 2024-06-25 ENCOUNTER — Ambulatory Visit: Payer: Self-pay | Admitting: Family Medicine

## 2024-06-25 LAB — CERVICOVAGINAL ANCILLARY ONLY
Bacterial Vaginitis (gardnerella): POSITIVE — AB
Candida Glabrata: NEGATIVE
Candida Vaginitis: NEGATIVE
Chlamydia: NEGATIVE
Comment: NEGATIVE
Comment: NEGATIVE
Comment: NEGATIVE
Comment: NEGATIVE
Comment: NEGATIVE
Comment: NORMAL
Neisseria Gonorrhea: NEGATIVE
Trichomonas: NEGATIVE

## 2024-06-26 LAB — URINE CULTURE

## 2024-06-29 MED ORDER — FLUCONAZOLE 150 MG PO TABS: 150.0000 mg | ORAL_TABLET | Freq: Once | ORAL | 0 refills | Status: AC

## 2024-06-29 MED ORDER — METRONIDAZOLE 500 MG PO TABS: 500.0000 mg | ORAL_TABLET | Freq: Two times a day (BID) | ORAL | 0 refills | Status: AC

## 2024-07-29 ENCOUNTER — Ambulatory Visit (INDEPENDENT_AMBULATORY_CARE_PROVIDER_SITE_OTHER): Admitting: Obstetrics and Gynecology

## 2024-07-29 ENCOUNTER — Other Ambulatory Visit (HOSPITAL_COMMUNITY)
Admission: RE | Admit: 2024-07-29 | Discharge: 2024-07-29 | Disposition: A | Discharge status: Home / Self Care | Source: Ambulatory Visit | Attending: Obstetrics and Gynecology | Admitting: Obstetrics and Gynecology

## 2024-07-29 VITALS — BP 99/60 | HR 72 | Ht 62.0 in | Wt 134.0 lb

## 2024-07-29 DIAGNOSIS — F419 Anxiety disorder, unspecified: Secondary | ICD-10-CM | POA: Diagnosis not present

## 2024-07-29 DIAGNOSIS — Z01419 Encounter for gynecological examination (general) (routine) without abnormal findings: Secondary | ICD-10-CM

## 2024-07-29 DIAGNOSIS — Z113 Encounter for screening for infections with a predominantly sexual mode of transmission: Secondary | ICD-10-CM | POA: Diagnosis not present

## 2024-07-29 DIAGNOSIS — Z124 Encounter for screening for malignant neoplasm of cervix: Secondary | ICD-10-CM

## 2024-07-29 NOTE — Progress Notes (Unsigned)
 ANNUAL EXAM Patient name: Kari [name] MRN [id number]  Date of birth: Sep 18, 1996 Chief Complaint:   No chief complaint on file.  History of Present Illness:   Kari [name] is a 28 y.o. [phone] being seen today for a routine annual exam.  Current complaints: annual  Discussed the use of AI scribe software for clinical note transcription with the patient, who gave verbal consent to proceed.  History of Present Illness Kari [name] is a 28 year old female who presents for a Pap smear and STD testing.  She is sexually active with a female partner and is not using contraception. She is not actively trying to conceive but is open to the possibility.  She experiences breast tenderness that began after discontinuing the Depo shot in March 2025, which she had been using for four years. She reported that her last period was on July 12th, 2025, after stopping the Depo shot. She reports that both breasts are sometimes sore, and she notices the pain more when removing her bra or getting up in the morning after sleeping on her back. She describes the pain as severe at times. Additionally, she has nipple discharge resembling milk, persisting since her last pregnancy, with a dark yellow, creamy texture.  She has a history of elevated GAD levels and significant anxiety following the death of her son in 2015-08-27. She was previously prescribed antidepressants, which exacerbated her symptoms, and has not pursued further treatment since then. She is considering therapy and has expressed interest in integrated behavioral health services.  She is not currently taking any medications, including over-the-counter supplements, and has not started any new medications recently. Her last pregnancy test on July 01, 2024, was negative. She is contemplating restarting birth control but remains uncertain.    Patient's last menstrual period was 07/04/2024 (exact date).   The pregnancy intention screening  data noted above was reviewed. Potential methods of contraception were discussed. The patient elected to proceed with No data recorded.   Last pap     Component Value Date/Time   DIAGPAP  02/09/2021 0945    - Negative for intraepithelial lesion or malignancy (NILM)   DIAGPAP  01/16/2018 0000    NEGATIVE FOR INTRAEPITHELIAL LESIONS OR MALIGNANCY.   ADEQPAP  02/09/2021 0945    Satisfactory for evaluation; transformation zone component PRESENT.   ADEQPAP  01/16/2018 0000    Satisfactory for evaluation  endocervical/transformation zone component PRESENT.    Last mammogram: n/a.  Last colonoscopy: n/a.      07/29/2024    4:18 PM 05/30/2022    3:22 PM  Depression screen PHQ 2/9  Decreased Interest 2 0  Down, Depressed, Hopeless 2 0  PHQ - 2 Score 4 0  Altered sleeping 2   Tired, decreased energy 2   Change in appetite 0   Feeling bad or failure about yourself  0   Trouble concentrating 0   Moving slowly or fidgety/restless 0   Suicidal thoughts 0   PHQ-9 Score 8         07/29/2024    4:19 PM  GAD 7 : Generalized Anxiety Score  Nervous, Anxious, on Edge 3  Control/stop worrying 3  Worry too much - different things 3  Trouble relaxing 3  Restless 1  Easily annoyed or irritable 3  Afraid - awful might happen 3  Total GAD 7 Score 19     Review of Systems:   Pertinent items are noted in HPI Denies any headaches, blurred  vision, fatigue, shortness of breath, chest pain, abdominal pain, abnormal vaginal discharge/itching/odor/irritation, problems with periods, bowel movements, urination, or intercourse unless otherwise stated above. Pertinent History Reviewed:  Reviewed past medical,surgical, social and family history.  Reviewed problem list, medications and allergies. Physical Assessment:   Vitals:   07/29/24 1520  BP: 99/60  Pulse: 72  Weight: 134 lb (60.8 kg)  Height: 5' 2 (1.575 m)  Body mass index is 24.51 kg/m.        Physical Examination:   General appearance  - well appearing, and in no distress  Mental status - alert, oriented to person, place, and time  Psych:  She has a normal mood and affect  Skin - warm and dry, normal color, no suspicious lesions noted  Chest - effort normal, all lung fields clear to auscultation bilaterally  Heart - normal rate and regular rhythm  Breasts - breasts appear normal, no suspicious masses, no skin or nipple changes or  axillary nodes, mild inner lower quadrant tenderness bilaterally  Abdomen - soft, nontender, nondistended, no masses or organomegaly; 1mm mobile skin tag on RUQ, nontender  Pelvic -  VULVA: normal appearing vulva with no masses, tenderness or lesions   VAGINA: normal appearing vagina with normal color and discharge, no lesions   CERVIX: normal appearing cervix without discharge or lesions, no CMT  Thin prep pap is done with HR HPV cotesting  UTERUS: uterus is felt to be normal size, shape, consistency and nontender   ADNEXA: No adnexal masses or tenderness noted.  Extremities:  No swelling or varicosities noted  Chaperone present for exam  No results found for this or any previous visit (from the past 24 hours).    Assessment & Plan:   Assessment & Plan Breast pain and galactorrhea Bilateral breast pain and galactorrhea likely due to hormonal changes post-Depo-Provera  discontinuation. Bilateral symptoms are reassuring, but further evaluation needed to exclude other causes. - Perform breast exam to assess for abnormal lumps or changes. - Consider pregnancy test to rule out pregnancy.  Premenstrual syndrome Breast tenderness may be linked to premenstrual syndrome as she transitions off Depo-Provera . Symptoms could be part of new premenstrual pattern as cycle normalizes. - Track symptoms in relation to menstrual cycle using a period tracking app.  Contraceptive management Currently not using contraception, ambivalent about pregnancy. Discussed ovulation timing and pregnancy risk  post-unprotected intercourse. Pregnancy more likely pre-menstruation due to ovulation. - Consider restarting Depo-Provera  after confirming not pregnant. - Schedule follow-up appointment post-next menstrual period to discuss contraceptive options.  Screening for sexually transmitted infections Sexually active, interested in STD testing. - Perform swabs for gonorrhea, chlamydia, and trichomonas. - Order blood tests for HIV, syphilis, hepatitis B, and C.  Skin tag under breast Skin tag appears benign, no immediate concerns. Advised monitoring for changes. - Monitor skin tag for changes in size, color, or texture. - Consider biopsy if significant changes noted with PCP or derm  Grief and adjustment concerns Reports past grief and adjustment issues post-child loss in 2016. Interested in therapy and support groups. Previously tried antidepressants with adverse effects. - Refer to integrated behavioral health for therapy options. - Provide information on local support groups, including free Black women support group through [facility]. - Discuss potential for medication therapy if needed in future.   Orders Placed This Encounter  Procedures   RPR+HBsAg+HCVAb+...   Amb ref to Integrated Behavioral Health    Meds: No orders of the defined types were placed in this encounter.  Follow-up: No follow-ups on file.  Carter Quarry, MD 07/29/2024 3:40 PM

## 2024-08-05 ENCOUNTER — Other Ambulatory Visit: Payer: Self-pay

## 2024-08-05 ENCOUNTER — Other Ambulatory Visit (HOSPITAL_BASED_OUTPATIENT_CLINIC_OR_DEPARTMENT_OTHER): Payer: Self-pay

## 2024-08-05 ENCOUNTER — Ambulatory Visit

## 2024-08-05 VITALS — BP 100/68 | HR 94 | Ht 62.0 in | Wt 132.0 lb

## 2024-08-05 DIAGNOSIS — Z3042 Encounter for surveillance of injectable contraceptive: Secondary | ICD-10-CM | POA: Diagnosis not present

## 2024-08-05 DIAGNOSIS — R300 Dysuria: Secondary | ICD-10-CM

## 2024-08-05 LAB — POCT URINALYSIS DIPSTICK
Bilirubin, UA: NEGATIVE
Glucose, UA: NEGATIVE
Nitrite, UA: NEGATIVE
Protein, UA: NEGATIVE
Spec Grav, UA: 1.015 (ref 1.010–1.025)
Urobilinogen, UA: 2 U/dL — AB
pH, UA: 7 (ref 5.0–8.0)

## 2024-08-05 LAB — CYTOLOGY - PAP
Chlamydia: NEGATIVE
Comment: NEGATIVE
Comment: NEGATIVE
Comment: NEGATIVE
Comment: NORMAL
Diagnosis: HIGH — AB
High risk HPV: POSITIVE — AB
Neisseria Gonorrhea: NEGATIVE
Trichomonas: NEGATIVE

## 2024-08-05 LAB — POCT URINE PREGNANCY: Preg Test, Ur: NEGATIVE

## 2024-08-05 MED ORDER — NITROFURANTOIN MONOHYD MACRO 100 MG PO CAPS
100.0000 mg | ORAL_CAPSULE | Freq: Two times a day (BID) | ORAL | 0 refills | Status: AC
  Filled 2024-08-05: qty 10, 5d supply, fill #0

## 2024-08-05 MED ORDER — MEDROXYPROGESTERONE ACETATE 150 MG/ML IM SUSY
150.0000 mg | PREFILLED_SYRINGE | Freq: Once | INTRAMUSCULAR | Status: AC
  Administered 2024-08-05 (×2): 150 mg via INTRAMUSCULAR

## 2024-08-05 MED ORDER — MEDROXYPROGESTERONE ACETATE 150 MG/ML IM SUSY
150.0000 mg | PREFILLED_SYRINGE | INTRAMUSCULAR | 3 refills | Status: DC
  Filled 2024-08-05 (×4): qty 1, 90d supply, fill #0

## 2024-08-05 NOTE — Progress Notes (Signed)
 Date last pap: 07/29/2024. Last Depo-Provera : 02/2024. Side Effects if any: none. Serum HCG indicated? no. Depo-Provera  150 mg IM given by: [facility] RN. Next appointment due 10/21/24-11/04/2024  Patient complaining of burning with urination. Urine culture sent. Rx for Macrobid  sent to pharmacy.    Kari [name]

## 2024-08-07 ENCOUNTER — Ambulatory Visit: Payer: Self-pay | Admitting: Obstetrics and Gynecology

## 2024-08-09 LAB — URINE CULTURE

## 2024-08-10 ENCOUNTER — Ambulatory Visit: Payer: Self-pay | Admitting: Family Medicine

## 2024-08-18 ENCOUNTER — Ambulatory Visit: Admitting: Licensed Clinical Social Worker

## 2024-08-18 DIAGNOSIS — Z91199 Patient's noncompliance with other medical treatment and regimen due to unspecified reason: Secondary | ICD-10-CM

## 2024-08-18 NOTE — BH Specialist Note (Signed)
 Patient no showed for today's visit.

## 2024-09-18 ENCOUNTER — Encounter: Admitting: Obstetrics and Gynecology

## 2024-10-21 ENCOUNTER — Other Ambulatory Visit (HOSPITAL_COMMUNITY)
Admission: RE | Admit: 2024-10-21 | Discharge: 2024-10-21 | Disposition: A | Discharge status: Home / Self Care | Source: Ambulatory Visit | Attending: Obstetrics and Gynecology | Admitting: Obstetrics and Gynecology

## 2024-10-21 ENCOUNTER — Ambulatory Visit: Admitting: Obstetrics and Gynecology

## 2024-10-21 VITALS — BP 118/66 | HR 102 | Ht 62.0 in | Wt 136.0 lb

## 2024-10-21 DIAGNOSIS — N912 Amenorrhea, unspecified: Secondary | ICD-10-CM | POA: Diagnosis not present

## 2024-10-21 DIAGNOSIS — R87611 Atypical squamous cells cannot exclude high grade squamous intraepithelial lesion on cytologic smear of cervix (ASC-H): Secondary | ICD-10-CM | POA: Insufficient documentation

## 2024-10-21 LAB — POCT URINE PREGNANCY: Preg Test, Ur: NEGATIVE

## 2024-10-21 NOTE — Progress Notes (Signed)
    GYNECOLOGY OFFICE COLPOSCOPY PROCEDURE NOTE  28 y.o. [id number] here for colposcopy for Pap 07/29/24 ASCUS-H, HRHPV. 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  Reviewed the risks of pain, bleeding, infection, possibility of additional procedures such as LEEP. Patient gave informed written consent, time out was performed.  Placed in lithotomy position. Cervix viewed with speculum and colposcope after application of acetic acid.   Colposcopy adequate? Yes  acetowhite lesion(s) noted at 12 and 5 o'clock; corresponding biopsies obtained.  ECC specimen obtained. All specimens were labeled and sent to pathology.  Chaperone was present during entire procedure.  Patient was given post procedure instructions.  Will follow up pathology and manage accordingly; patient will be contacted with results and recommendations.  Routine preventative health maintenance measures emphasized.  Rollo ONEIDA Bring, MD, FACOG Obstetrician & Gynecologist, [facility], [facility]

## 2024-10-22 ENCOUNTER — Encounter: Payer: Self-pay | Admitting: Obstetrics and Gynecology

## 2024-10-23 LAB — SURGICAL PATHOLOGY

## 2024-10-25 ENCOUNTER — Ambulatory Visit: Payer: Self-pay | Admitting: Obstetrics and Gynecology

## 2024-10-28 ENCOUNTER — Ambulatory Visit

## 2024-12-25 ENCOUNTER — Encounter: Payer: Self-pay | Admitting: Obstetrics and Gynecology
